# Patient Record
Sex: Female | Born: 1935 | Race: White | Hispanic: No | Smoking: Never smoker
Health system: Southern US, Community
[De-identification: ages and names within clinical notes are randomized; demographics above are authoritative.]

## PROBLEM LIST (undated history)

## (undated) DIAGNOSIS — I4891 Unspecified atrial fibrillation: Secondary | ICD-10-CM

## (undated) DIAGNOSIS — I10 Essential (primary) hypertension: Secondary | ICD-10-CM

## (undated) DIAGNOSIS — K219 Gastro-esophageal reflux disease without esophagitis: Secondary | ICD-10-CM

## (undated) DIAGNOSIS — I499 Cardiac arrhythmia, unspecified: Secondary | ICD-10-CM

## (undated) DIAGNOSIS — J189 Pneumonia, unspecified organism: Secondary | ICD-10-CM

## (undated) DIAGNOSIS — M1990 Unspecified osteoarthritis, unspecified site: Secondary | ICD-10-CM

## (undated) DIAGNOSIS — E119 Type 2 diabetes mellitus without complications: Secondary | ICD-10-CM

## (undated) HISTORY — PX: CHOLECYSTECTOMY: SHX55

## (undated) HISTORY — PX: ABDOMINAL HYSTERECTOMY: SHX81

## (undated) HISTORY — PX: UPPER GI ENDOSCOPY: SHX6162

## (undated) HISTORY — DX: Unspecified atrial fibrillation: I48.91

## (undated) HISTORY — PX: ORIF ANKLE FRACTURE: SUR919

---

## 2004-07-08 ENCOUNTER — Ambulatory Visit: Payer: Self-pay | Admitting: Otolaryngology

## 2004-10-10 ENCOUNTER — Emergency Department: Payer: Self-pay | Admitting: Emergency Medicine

## 2004-10-22 ENCOUNTER — Ambulatory Visit: Payer: Self-pay

## 2004-11-06 ENCOUNTER — Ambulatory Visit: Payer: Self-pay

## 2005-01-09 ENCOUNTER — Inpatient Hospital Stay: Payer: Self-pay | Admitting: Specialist

## 2005-07-12 ENCOUNTER — Ambulatory Visit: Payer: Self-pay | Admitting: Surgery

## 2005-07-14 ENCOUNTER — Ambulatory Visit: Payer: Self-pay | Admitting: Surgery

## 2005-08-10 ENCOUNTER — Ambulatory Visit: Payer: Self-pay

## 2005-08-10 ENCOUNTER — Ambulatory Visit: Payer: Self-pay | Admitting: Unknown Physician Specialty

## 2005-08-11 ENCOUNTER — Ambulatory Visit: Payer: Self-pay | Admitting: Unknown Physician Specialty

## 2005-08-20 ENCOUNTER — Ambulatory Visit: Payer: Self-pay | Admitting: Unknown Physician Specialty

## 2005-09-08 ENCOUNTER — Inpatient Hospital Stay: Payer: Self-pay | Admitting: Obstetrics and Gynecology

## 2005-12-15 ENCOUNTER — Ambulatory Visit: Payer: Self-pay | Admitting: Gynecologic Oncology

## 2005-12-30 ENCOUNTER — Ambulatory Visit: Payer: Self-pay | Admitting: Internal Medicine

## 2006-02-01 ENCOUNTER — Ambulatory Visit: Payer: Self-pay

## 2006-03-24 ENCOUNTER — Ambulatory Visit: Payer: Self-pay | Admitting: Obstetrics and Gynecology

## 2006-09-26 ENCOUNTER — Ambulatory Visit: Payer: Self-pay | Admitting: Obstetrics and Gynecology

## 2006-12-08 ENCOUNTER — Ambulatory Visit: Payer: Self-pay | Admitting: Internal Medicine

## 2007-01-02 ENCOUNTER — Ambulatory Visit: Payer: Self-pay | Admitting: Internal Medicine

## 2007-03-14 ENCOUNTER — Ambulatory Visit: Payer: Self-pay | Admitting: Internal Medicine

## 2007-03-23 IMAGING — CT CT PELVIS W/ CM
1 of 2 series · 14 of 32 positions shown, 19 images · non-contrast
Comparison: none

REASON FOR EXAM: Pelvic mass.  ALLERGIC TO DYE - will pick up pre-med
COMMENTS:

[Series 2: soft tissue pelvis · axial · 0.74mm/px · z∈[-684,-469]mm · 14 of 49 slices shown, 19 images]
[im 3/49  soft-tissue]
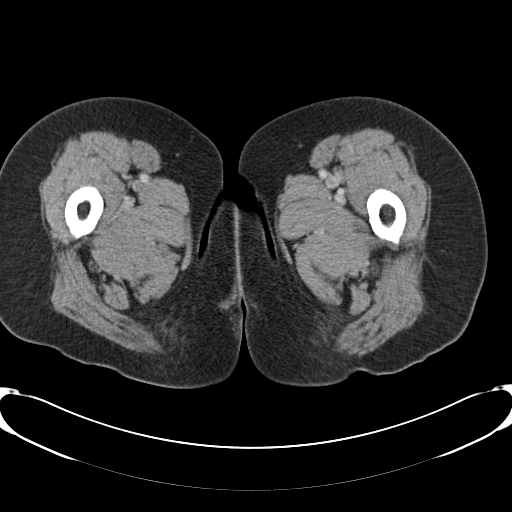
[im 3/49  bone]
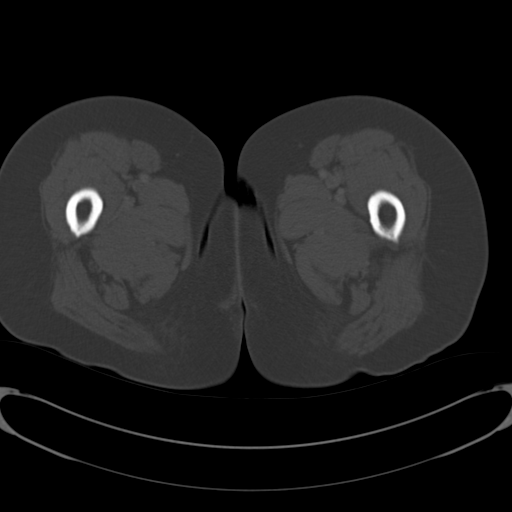
[im 8/49  soft-tissue]
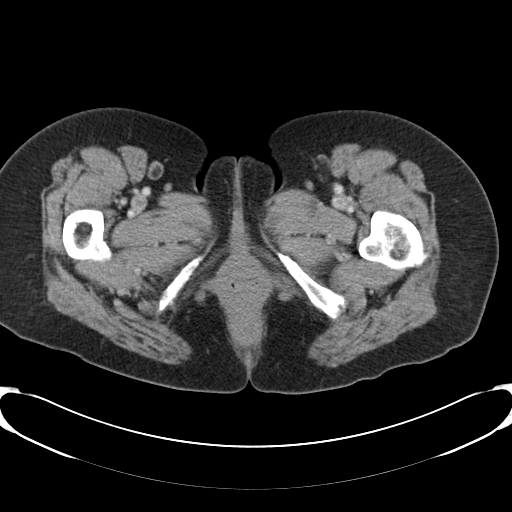
[im 11/49  soft-tissue]
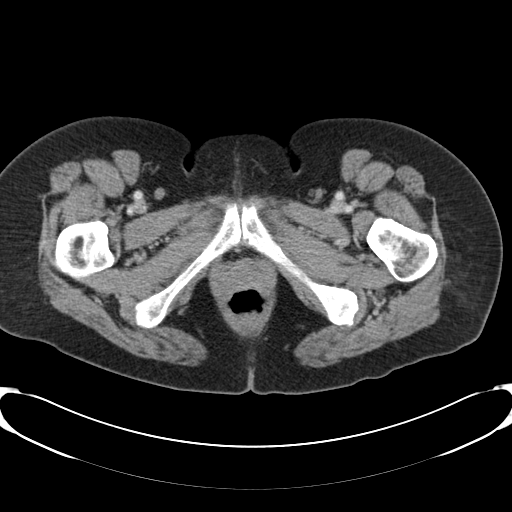
[im 13/49  soft-tissue]
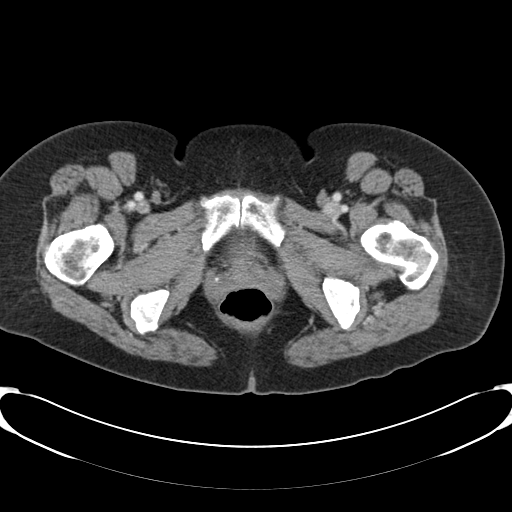
[im 18/49  soft-tissue]
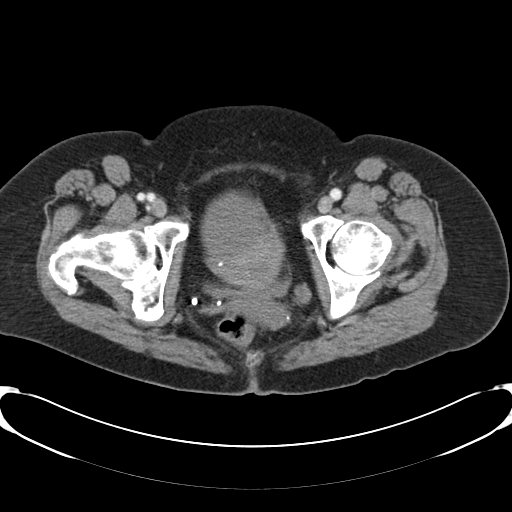
[im 21/49  soft-tissue]
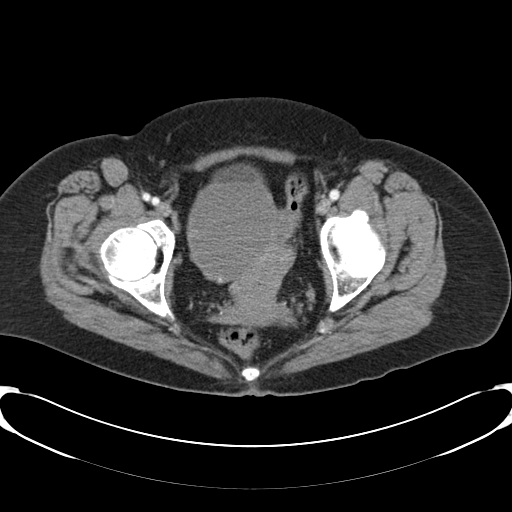
[im 26/49  soft-tissue]
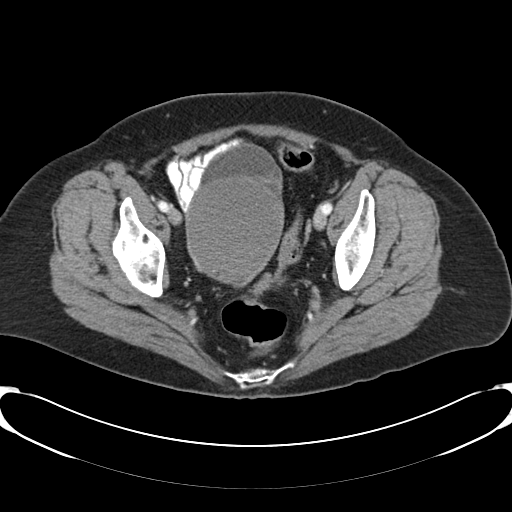
[im 28/49  soft-tissue]
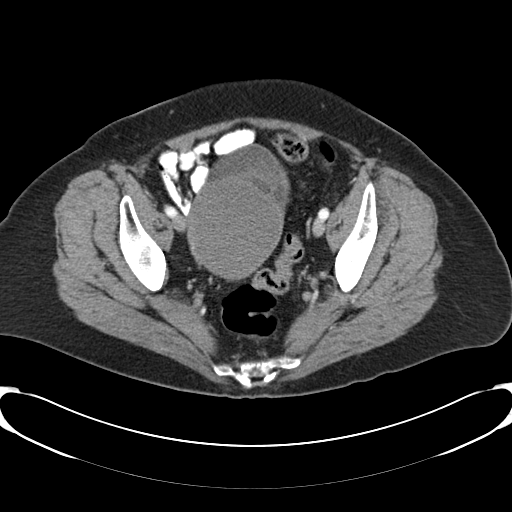
[im 31/49  soft-tissue]
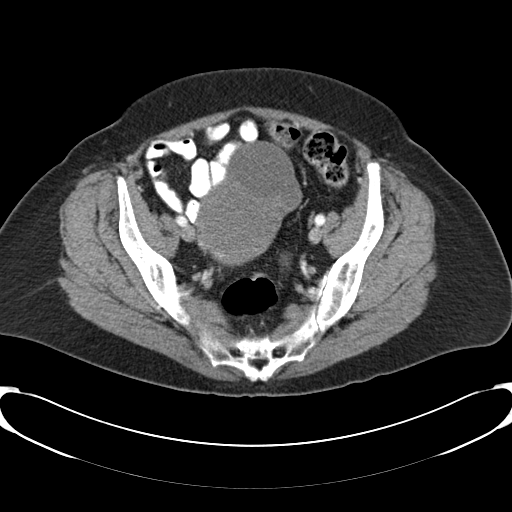
[im 31/49  bone]
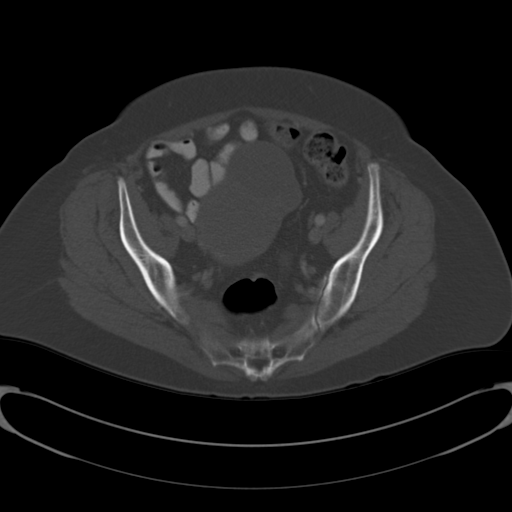
[im 36/49  soft-tissue]
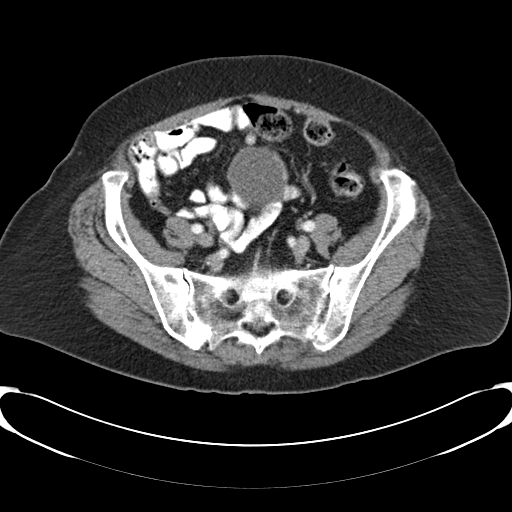
[im 38/49  soft-tissue]
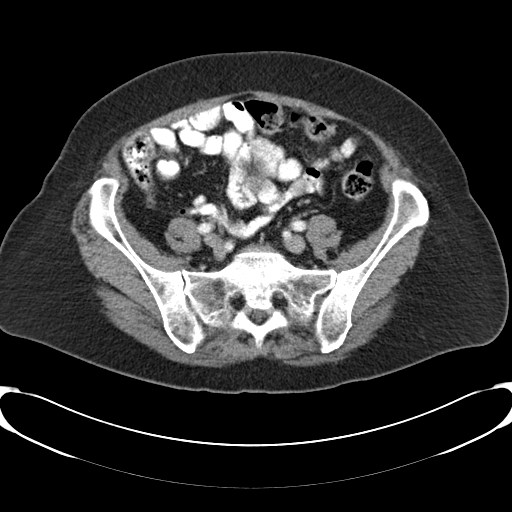
[im 38/49  lung]
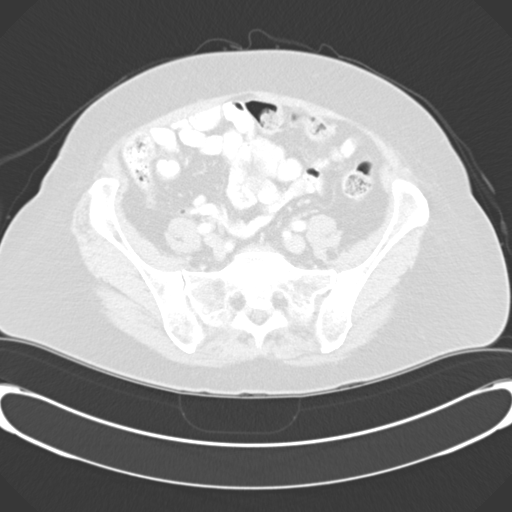
[im 41/49  soft-tissue]
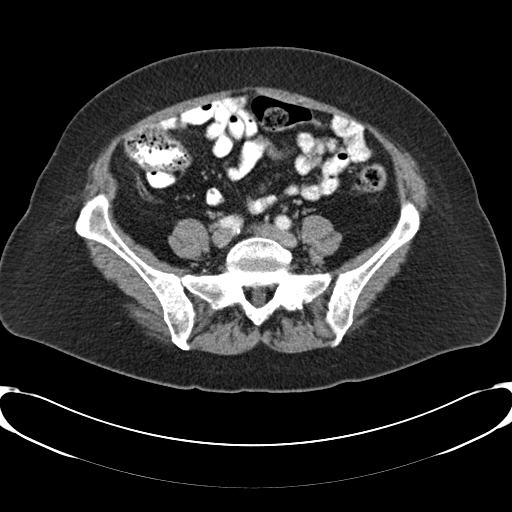
[im 41/49  lung]
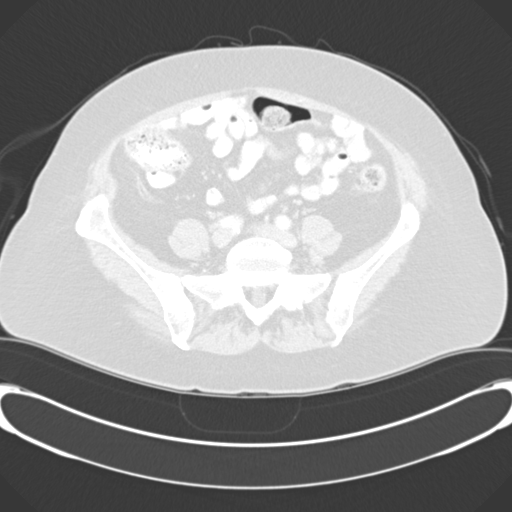
[im 43/49  lung]
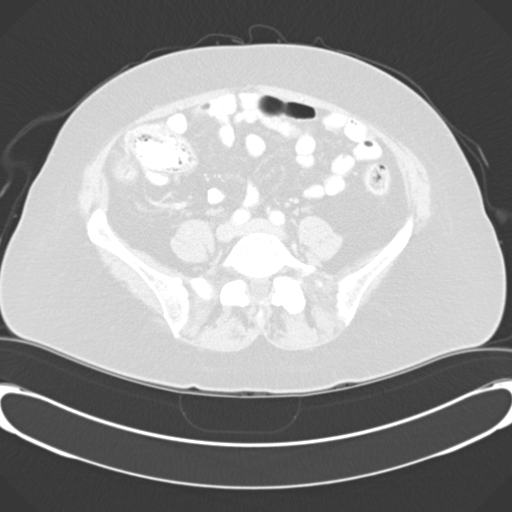
[im 46/49  soft-tissue]
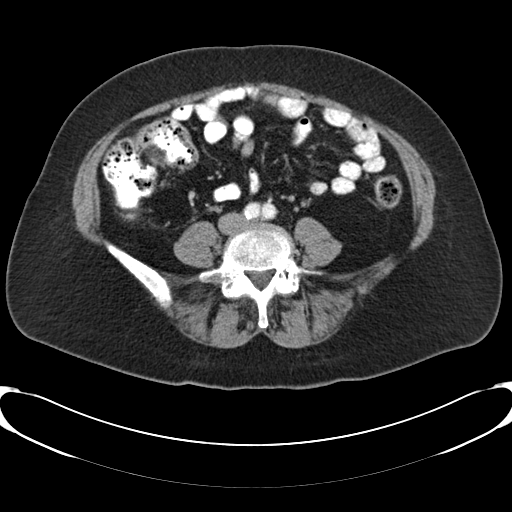
[im 46/49  lung]
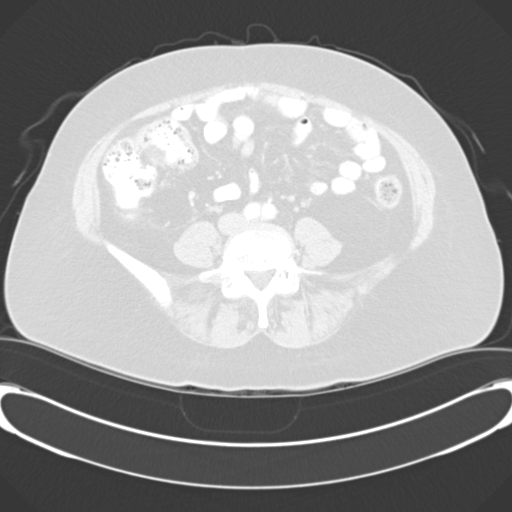

[14 of 32 positions shown; findings below may reference images not displayed]

PROCEDURE:     CT  - CT PELVIS STANDARD W  - August 10, 2005 [DATE]

RESULT:     Axial images were obtained from the iliac crest to the pubic
symphysis post intravenous and oral administration of contrast material.

There is a large mass with variable attenuation in the RIGHT pelvis
measuring 6.8 transverse x 10.24 AP and 10 cm superoinferior dimension.
There is some mild enhancement of a larger posteroinferior component.  The
mass compresses on the uterus deviating the uterus to the LEFT.  This most
likely represents an ovarian tumor, either a mucinous or serous cystadenoma
or carcinoma.  I do not see any definite fluid in the pelvis and no pelvis
adenopathy is identified.
IMPRESSION: Large mass in the RIGHT pelvis which may represent a mucinous or serous
cystadenoma or carcinoma.  No definite adenopathy or fluid is seen within
the pelvis.

## 2007-03-24 IMAGING — US US PELV - US TRANSVAGINAL
1 series · 17 of 25 positions shown · non-contrast
Comparison: none

REASON FOR EXAM: Pelvic mass
COMMENTS:

[Series 1: us pelv - us transvaginal · 17 of 86 slices shown]
[im 1/86]
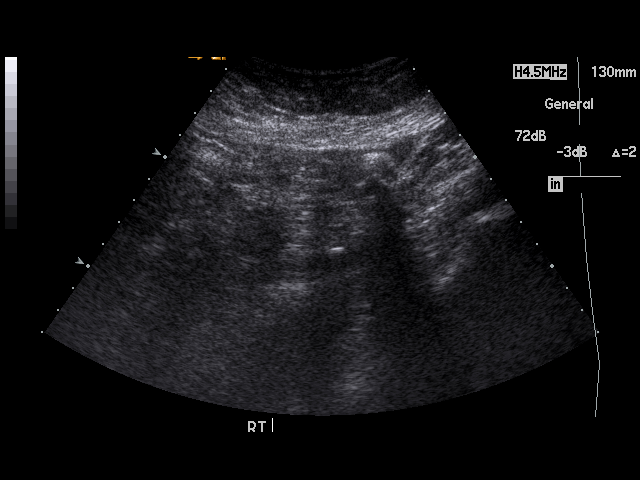
[im 8/86]
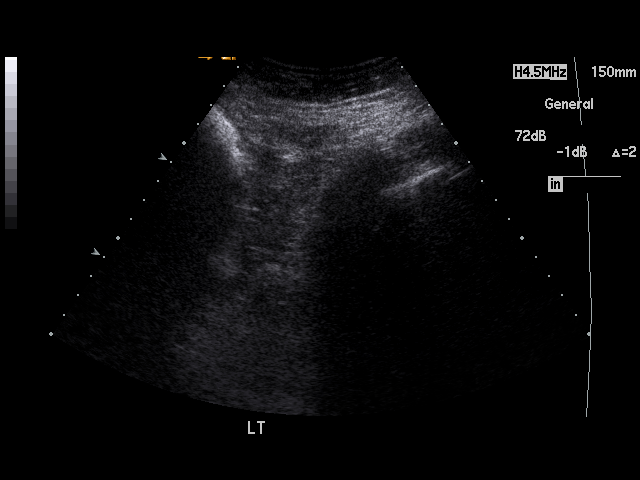
[im 11/86]
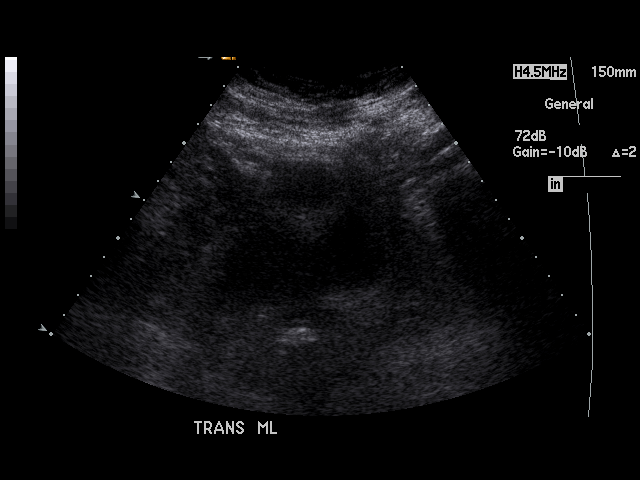
[im 18/86]
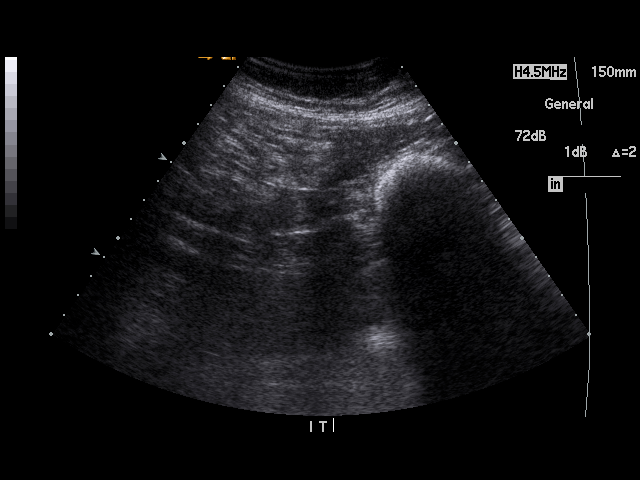
[im 22/86]
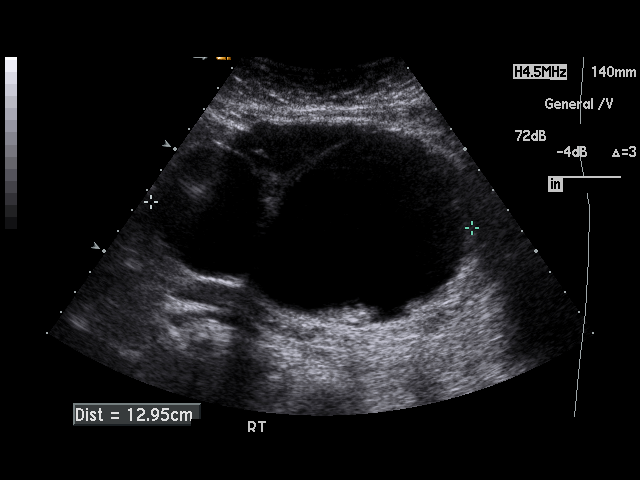
[im 29/86]
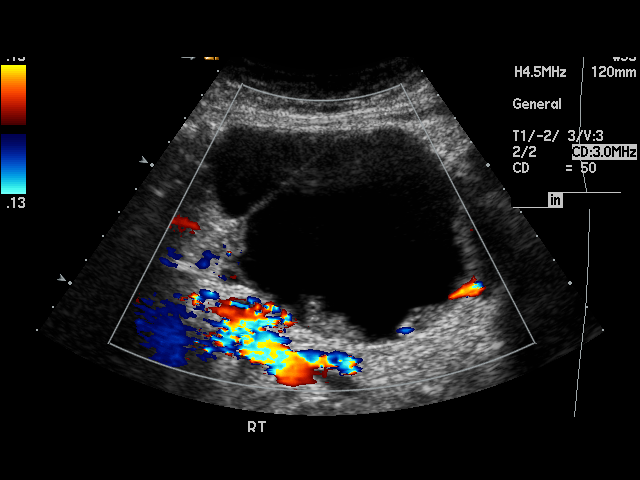
[im 32/86]
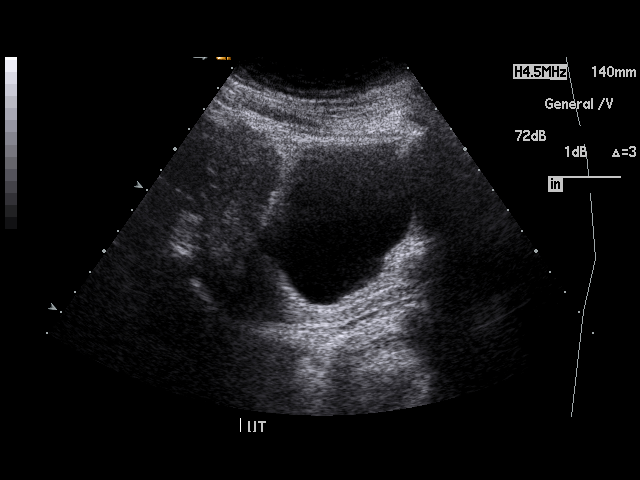
[im 39/86]
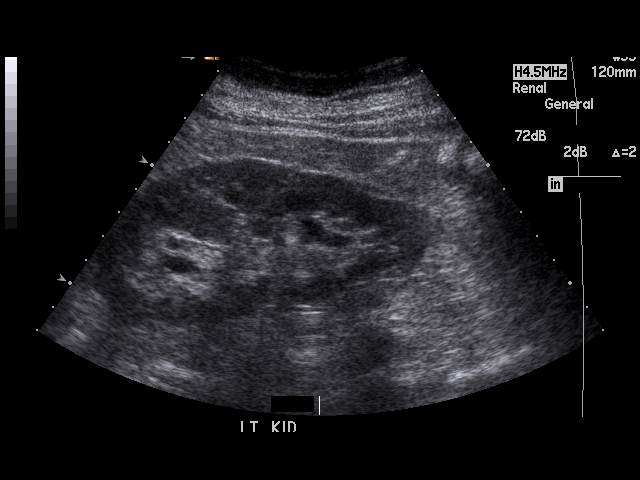
[im 43/86]
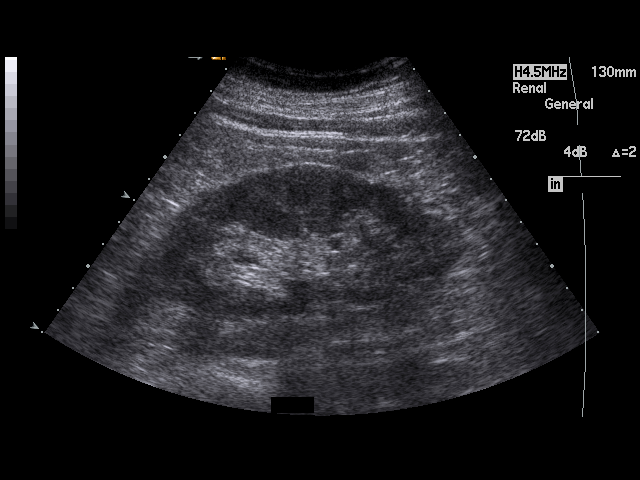
[im 47/86]
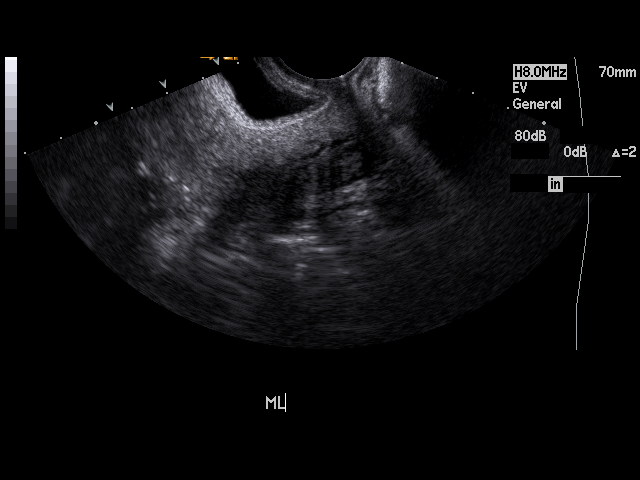
[im 54/86]
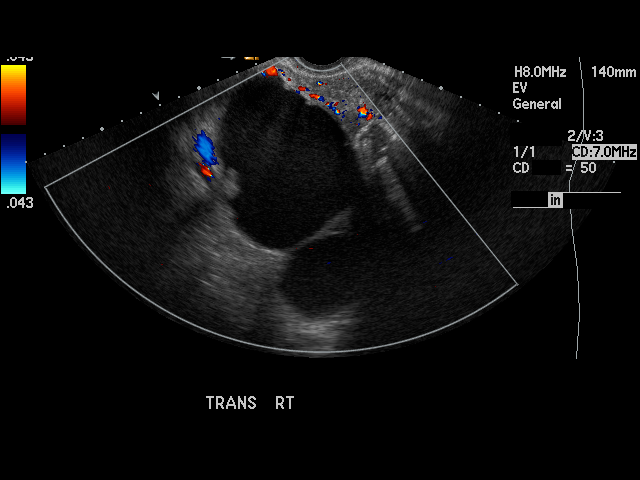
[im 57/86]
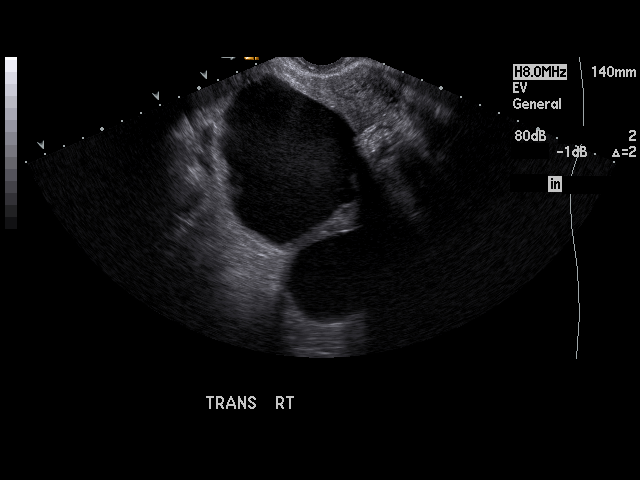
[im 64/86]
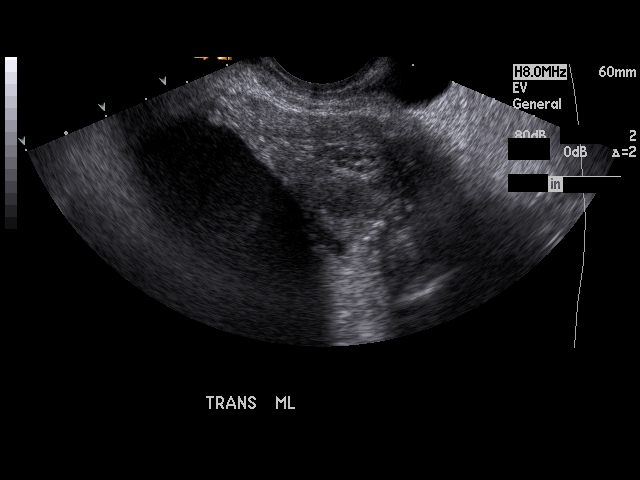
[im 68/86]
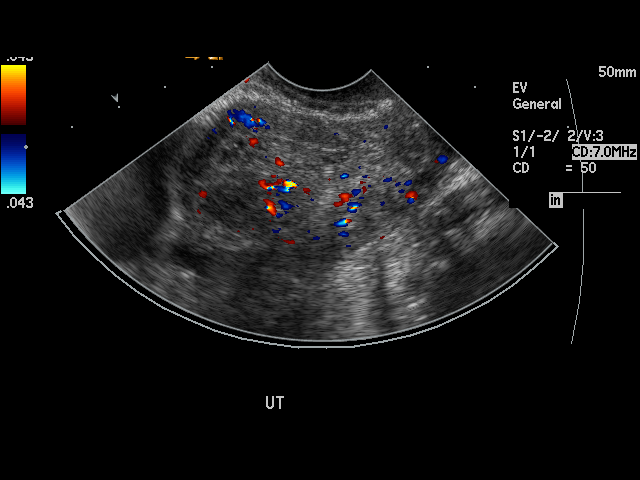
[im 75/86]
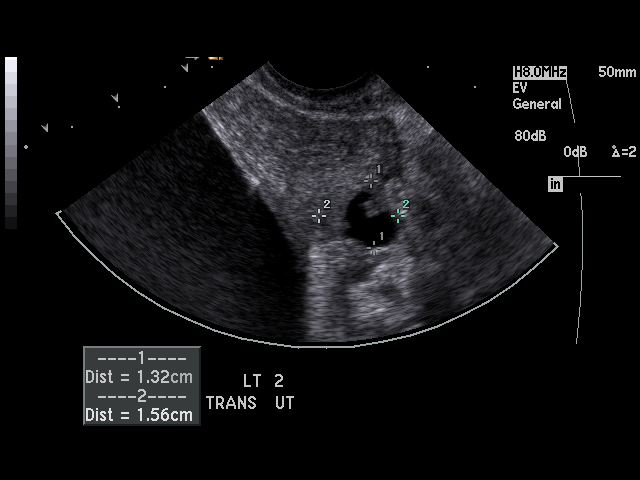
[im 78/86]
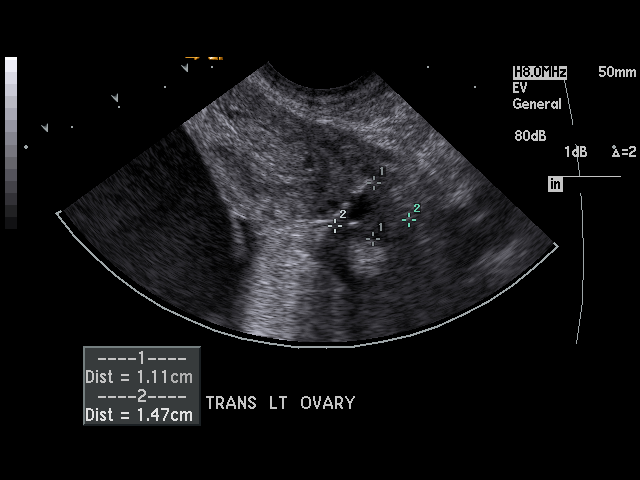
[im 86/86]
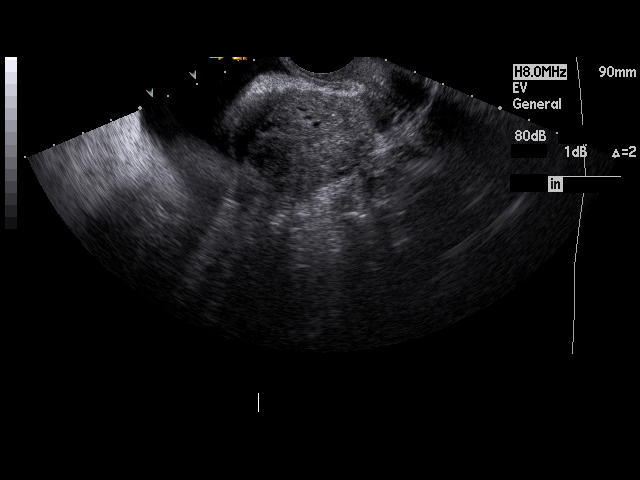

[17 of 25 positions shown; findings below may reference images not displayed]

PROCEDURE:     US  - US PELVIS MASS EXAM  - [DATE]  [DATE] [DATE]  [DATE]

RESULT:     The uterus measures 8.5 cm x 4.1 cm x 5.6 cm. The endometrium
measures 1.1 cm in thickness. There is a hypoechoic mass of the uterus
posteriorly on the RIGHT measuring 1.59 cm at maximum diameter compatible
with a small, uterine fibroid. On the LEFT there is an apparent 1.6 cm
cystic or complex mass associated with the posterior uterine wall.

In the RIGHT adnexal area there is a septated cystic mass measuring 12.95 cm
x 8.47 cm x 7.04 cm. A septated ovarian mass would be the primary
consideration as to etiology. The LEFT ovary is normal in appearance and
measures 2.06 cm at maximum diameter. No free fluid is seen in the pelvis.
The kidneys show no hydronephrosis.
IMPRESSION: 1.     The uterine endometrium measures 1.1 cm which is thickened for the
patient's age.
2.     There is a hypoechoic mass in the uterus compatible with a uterine
fibroid.
3.     There is a cystic or complex 1.6 cm mass associated with the
posterior margin of the uterus.
4.     There is a septated, 12.95 cm, complex or septated cystic mass in the
RIGHT adnexal area compatible with a RIGHT ovarian mass.
5.     The LEFT ovary is normal in appearance.
6.     No ascites is seen.

## 2007-05-09 ENCOUNTER — Other Ambulatory Visit: Payer: Self-pay

## 2007-05-09 ENCOUNTER — Ambulatory Visit: Payer: Self-pay | Admitting: Gastroenterology

## 2007-10-05 ENCOUNTER — Ambulatory Visit: Payer: Self-pay | Admitting: Obstetrics and Gynecology

## 2007-10-30 ENCOUNTER — Ambulatory Visit: Payer: Self-pay | Admitting: Family Medicine

## 2007-11-06 ENCOUNTER — Emergency Department: Payer: Self-pay | Admitting: Emergency Medicine

## 2007-11-06 ENCOUNTER — Other Ambulatory Visit: Payer: Self-pay

## 2008-06-13 ENCOUNTER — Ambulatory Visit: Payer: Self-pay | Admitting: Gastroenterology

## 2008-06-19 ENCOUNTER — Ambulatory Visit: Payer: Self-pay | Admitting: Internal Medicine

## 2008-07-24 ENCOUNTER — Ambulatory Visit: Payer: Self-pay | Admitting: Internal Medicine

## 2009-04-22 ENCOUNTER — Ambulatory Visit: Payer: Self-pay | Admitting: Internal Medicine

## 2009-04-25 ENCOUNTER — Inpatient Hospital Stay: Payer: Self-pay | Admitting: Specialist

## 2009-06-02 ENCOUNTER — Ambulatory Visit: Payer: Self-pay | Admitting: Internal Medicine

## 2009-06-24 ENCOUNTER — Ambulatory Visit: Payer: Self-pay | Admitting: Internal Medicine

## 2009-09-06 HISTORY — PX: BREAST BIOPSY: SHX20

## 2010-06-30 ENCOUNTER — Ambulatory Visit: Payer: Self-pay

## 2010-07-14 ENCOUNTER — Ambulatory Visit: Payer: Self-pay

## 2010-08-11 ENCOUNTER — Ambulatory Visit: Payer: Self-pay | Admitting: General Surgery

## 2011-05-05 ENCOUNTER — Ambulatory Visit: Payer: Self-pay

## 2012-09-07 ENCOUNTER — Ambulatory Visit: Payer: Self-pay

## 2013-12-07 ENCOUNTER — Ambulatory Visit: Payer: Self-pay

## 2015-03-04 ENCOUNTER — Inpatient Hospital Stay: Admission: RE | Admit: 2015-03-04 | Payer: Self-pay | Source: Ambulatory Visit

## 2015-03-12 NOTE — OR Nursing (Signed)
This RN spoke with Vanessa Dalton NP at [facility]; Dr Stephanie Coup was not at the office today.  Vanessa Gentry states patient is ok for surgery to proceed.  Normal stress test was performed on 01/15/15; Echocardiogram was performed on 01/23/15 with EF of 47.  ECG was perfomed with HR of 155.  Vanessa Gentry states heart rate has been 70-85 at the office visits for last 6 months and the patient was not in atrial fibrillation as per last visit,

## 2015-03-13 ENCOUNTER — Encounter: Payer: Self-pay | Admitting: *Deleted

## 2015-03-13 ENCOUNTER — Ambulatory Visit
Admission: RE | Admit: 2015-03-13 | Discharge: 2015-03-13 | Disposition: A | Discharge status: Home / Self Care | Payer: Commercial Managed Care - HMO | Source: Ambulatory Visit | Attending: Ophthalmology | Admitting: Ophthalmology

## 2015-03-13 ENCOUNTER — Ambulatory Visit: Payer: Commercial Managed Care - HMO | Admitting: Anesthesiology

## 2015-03-13 ENCOUNTER — Encounter
Admission: RE | Disposition: A | Discharge status: Home / Self Care | Payer: Self-pay | Source: Ambulatory Visit | Attending: Ophthalmology

## 2015-03-13 DIAGNOSIS — Z79899 Other long term (current) drug therapy: Secondary | ICD-10-CM | POA: Diagnosis not present

## 2015-03-13 DIAGNOSIS — Z91041 Radiographic dye allergy status: Secondary | ICD-10-CM | POA: Insufficient documentation

## 2015-03-13 DIAGNOSIS — E119 Type 2 diabetes mellitus without complications: Secondary | ICD-10-CM | POA: Insufficient documentation

## 2015-03-13 DIAGNOSIS — H2511 Age-related nuclear cataract, right eye: Secondary | ICD-10-CM | POA: Diagnosis not present

## 2015-03-13 DIAGNOSIS — R002 Palpitations: Secondary | ICD-10-CM | POA: Diagnosis not present

## 2015-03-13 DIAGNOSIS — I499 Cardiac arrhythmia, unspecified: Secondary | ICD-10-CM | POA: Insufficient documentation

## 2015-03-13 DIAGNOSIS — Z90710 Acquired absence of both cervix and uterus: Secondary | ICD-10-CM | POA: Insufficient documentation

## 2015-03-13 DIAGNOSIS — Z882 Allergy status to sulfonamides status: Secondary | ICD-10-CM | POA: Insufficient documentation

## 2015-03-13 DIAGNOSIS — M1990 Unspecified osteoarthritis, unspecified site: Secondary | ICD-10-CM | POA: Diagnosis not present

## 2015-03-13 DIAGNOSIS — K219 Gastro-esophageal reflux disease without esophagitis: Secondary | ICD-10-CM | POA: Insufficient documentation

## 2015-03-13 DIAGNOSIS — Z9049 Acquired absence of other specified parts of digestive tract: Secondary | ICD-10-CM | POA: Diagnosis not present

## 2015-03-13 DIAGNOSIS — I10 Essential (primary) hypertension: Secondary | ICD-10-CM | POA: Diagnosis not present

## 2015-03-13 HISTORY — DX: Type 2 diabetes mellitus without complications: E11.9

## 2015-03-13 HISTORY — DX: Unspecified osteoarthritis, unspecified site: M19.90

## 2015-03-13 HISTORY — DX: Essential (primary) hypertension: I10

## 2015-03-13 HISTORY — PX: CATARACT EXTRACTION W/PHACO: SHX586

## 2015-03-13 HISTORY — DX: Gastro-esophageal reflux disease without esophagitis: K21.9

## 2015-03-13 HISTORY — DX: Pneumonia, unspecified organism: J18.9

## 2015-03-13 HISTORY — DX: Cardiac arrhythmia, unspecified: I49.9

## 2015-03-13 LAB — GLUCOSE, CAPILLARY: Glucose-Capillary: 126 mg/dL — ABNORMAL HIGH (ref 65–99)

## 2015-03-13 SURGERY — PHACOEMULSIFICATION, CATARACT, WITH IOL INSERTION
Anesthesia: Monitor Anesthesia Care | Laterality: Right | Wound class: Clean

## 2015-03-13 MED ORDER — MOXIFLOXACIN HCL 0.5 % OP SOLN
OPHTHALMIC | Status: AC
  Filled 2015-03-13: qty 3

## 2015-03-13 MED ORDER — SODIUM CHLORIDE 0.9 % IV SOLN
INTRAVENOUS | Status: DC
  Administered 2015-03-13: 08:00:00 via INTRAVENOUS

## 2015-03-13 MED ORDER — MIDAZOLAM HCL 2 MG/2ML IJ SOLN
INTRAMUSCULAR | Status: DC | PRN
  Administered 2015-03-13: 1 mg via INTRAVENOUS

## 2015-03-13 MED ORDER — FENTANYL CITRATE (PF) 100 MCG/2ML IJ SOLN
INTRAMUSCULAR | Status: DC | PRN
  Administered 2015-03-13: 50 ug via INTRAVENOUS

## 2015-03-13 MED ORDER — LIDOCAINE HCL (PF) 4 % IJ SOLN
INTRAMUSCULAR | Status: AC
  Filled 2015-03-13: qty 5

## 2015-03-13 MED ORDER — POVIDONE-IODINE 5 % OP SOLN
1.0000 "application " | Freq: Once | OPHTHALMIC | Status: AC
  Administered 2015-03-13: 1 via OPHTHALMIC

## 2015-03-13 MED ORDER — EPINEPHRINE HCL 1 MG/ML IJ SOLN
INTRAMUSCULAR | Status: AC
  Filled 2015-03-13: qty 1

## 2015-03-13 MED ORDER — ARMC OPHTHALMIC DILATING GEL
OPHTHALMIC | Status: AC
  Administered 2015-03-13: 1 via OPHTHALMIC
  Filled 2015-03-13: qty 0.25

## 2015-03-13 MED ORDER — POVIDONE-IODINE 5 % OP SOLN
OPHTHALMIC | Status: AC
  Administered 2015-03-13: 1 via OPHTHALMIC
  Filled 2015-03-13: qty 30

## 2015-03-13 MED ORDER — NA HYALUR & NA CHOND-NA HYALUR 0.55-0.5 ML IO KIT
PACK | INTRAOCULAR | Status: AC
  Filled 2015-03-13: qty 1.05

## 2015-03-13 MED ORDER — TETRACAINE HCL 0.5 % OP SOLN
OPHTHALMIC | Status: AC
  Administered 2015-03-13: 1 [drp] via OPHTHALMIC
  Filled 2015-03-13: qty 2

## 2015-03-13 MED ORDER — CEFUROXIME OPHTHALMIC INJECTION 1 MG/0.1 ML
INJECTION | OPHTHALMIC | Status: AC
  Filled 2015-03-13: qty 0.1

## 2015-03-13 MED ORDER — EPINEPHRINE HCL 1 MG/ML IJ SOLN
INTRAOCULAR | Status: DC | PRN
  Administered 2015-03-13: 150 mL via OPHTHALMIC

## 2015-03-13 MED ORDER — CEFUROXIME OPHTHALMIC INJECTION 1 MG/0.1 ML
INJECTION | OPHTHALMIC | Status: DC | PRN
  Administered 2015-03-13: 0.1 mL via INTRACAMERAL

## 2015-03-13 MED ORDER — MOXIFLOXACIN HCL 0.5 % OP SOLN
OPHTHALMIC | Status: DC | PRN
  Administered 2015-03-13: 2 [drp]

## 2015-03-13 MED ORDER — MOXIFLOXACIN HCL 0.5 % OP SOLN: 1.0000 [drp] | Freq: Once | OPHTHALMIC | Status: DC

## 2015-03-13 MED ORDER — TETRACAINE HCL 0.5 % OP SOLN
1.0000 [drp] | Freq: Once | OPHTHALMIC | Status: AC
  Administered 2015-03-13: 1 [drp] via OPHTHALMIC

## 2015-03-13 MED ORDER — ARMC OPHTHALMIC DILATING GEL
1.0000 "application " | OPHTHALMIC | Status: DC | PRN
  Administered 2015-03-13 (×2): 1 via OPHTHALMIC

## 2015-03-13 MED ORDER — NEOMYCIN-POLYMYXIN-DEXAMETH 3.5-10000-0.1 OP OINT
TOPICAL_OINTMENT | OPHTHALMIC | Status: DC | PRN
  Administered 2015-03-13: 1 via OPHTHALMIC

## 2015-03-13 SURGICAL SUPPLY — 19 items
CANNULA ANT/CHMB 27GA (MISCELLANEOUS) ×2 IMPLANT
GLOVE BIO SURGEON STRL SZ8 (GLOVE) ×2 IMPLANT
GLOVE BIOGEL M 6.5 STRL (GLOVE) ×2 IMPLANT
GLOVE SURG LX 7.5 STRW (GLOVE) ×1
GLOVE SURG LX STRL 7.5 STRW (GLOVE) ×1 IMPLANT
GOWN STRL REUS W/ TWL LRG LVL3 (GOWN DISPOSABLE) ×2 IMPLANT
GOWN STRL REUS W/TWL LRG LVL3 (GOWN DISPOSABLE) ×2
LENS IOL TECNIS 16.5 (Intraocular Lens) ×2 IMPLANT
PACK CATARACT (MISCELLANEOUS) ×2 IMPLANT
PACK CATARACT BRASINGTON LX (MISCELLANEOUS) ×2 IMPLANT
PACK EYE AFTER SURG (MISCELLANEOUS) ×2 IMPLANT
SOL BSS BAG (MISCELLANEOUS) ×2
SOL PREP PVP 2OZ (MISCELLANEOUS) ×2
SOLUTION BSS BAG (MISCELLANEOUS) ×1 IMPLANT
SOLUTION PREP PVP 2OZ (MISCELLANEOUS) ×1 IMPLANT
SYR 5ML LL (SYRINGE) ×2 IMPLANT
SYR TB 1ML 27GX1/2 LL (SYRINGE) ×2 IMPLANT
WATER STERILE IRR 1000ML POUR (IV SOLUTION) ×2 IMPLANT
WIPE NON LINTING 3.25X3.25 (MISCELLANEOUS) ×2 IMPLANT

## 2015-03-13 NOTE — Anesthesia Postprocedure Evaluation (Signed)
  Anesthesia Post-op Note  Patient: Vanessa Gentry  Procedure(s) Performed: Procedure(s) with comments: CATARACT EXTRACTION PHACO AND INTRAOCULAR LENS PLACEMENT (IOC) (Right) - [place]  2:40  AP 20.3   CDE 32.41 cassette lot #   [id number]  Anesthesia type:MAC  Patient location: short stay  Post pain: Pain level controlled  Post assessment: Post-op Vital signs reviewed, Patient's Cardiovascular Status Stable, Respiratory Function Stable, Patent Airway and No signs of Nausea or vomiting  Post vital signs: Reviewed and stable  Last Vitals:  Filed Vitals:   03/13/15 0654  BP: 138/62  Pulse: 64  Temp: 36.4 C  Resp: 16    Level of consciousness: awake, alert  and patient cooperative  Complications: No apparent anesthesia complications

## 2015-03-13 NOTE — Anesthesia Preprocedure Evaluation (Signed)
Anesthesia Evaluation  Patient identified by MRN, date of birth, ID band Patient awake    Reviewed: Allergy & Precautions, NPO status , Patient's Chart, lab work & pertinent test results, reviewed documented beta blocker date and time   Airway Mallampati: II  TM Distance: >3 FB     Dental  (+) Chipped   Pulmonary pneumonia -,          Cardiovascular hypertension, + dysrhythmias     Neuro/Psych    GI/Hepatic GERD-  ,  Endo/Other  diabetes, Well Controlled, Type 2  Renal/GU      Musculoskeletal  (+) Arthritis -,   Abdominal   Peds  Hematology   Anesthesia Other Findings   Reproductive/Obstetrics                             Anesthesia Physical Anesthesia Plan  ASA: III  Anesthesia Plan: MAC   Post-op Pain Management:    Induction:   Airway Management Planned: Nasal Cannula  Additional Equipment:   Intra-op Plan:   Post-operative Plan:   Informed Consent: I have reviewed the patients History and Physical, chart, labs and discussed the procedure including the risks, benefits and alternatives for the proposed anesthesia with the patient or authorized representative who has indicated his/her understanding and acceptance.     Plan Discussed with: CRNA  Anesthesia Plan Comments:         Anesthesia Quick Evaluation

## 2015-03-13 NOTE — Discharge Instructions (Signed)
AMBULATORY SURGERY  DISCHARGE INSTRUCTIONS   1) The drugs that you were given will stay in your system until tomorrow so for the next 24 hours you should not:  A) Drive an automobile B) Make any legal decisions C) Drink any alcoholic beverage   2) You may resume regular meals tomorrow.  Today it is better to start with liquids and gradually work up to solid foods.  You may eat anything you prefer, but it is better to start with liquids, then soup and crackers, and gradually work up to solid foods.   3) Please notify your doctor immediately if you have any unusual bleeding, trouble breathing, redness and pain at the surgery site, drainage, fever, or pain not relieved by medication.    Additional Instructions:Cataract Surgery Care After Refer to this sheet in the next few weeks. These instructions provide you with information on caring for yourself after your procedure. Your caregiver may also give you more specific instructions. Your treatment has been planned according to current medical practices, but problems sometimes occur. Call your caregiver if you have any problems or questions after your procedure.  HOME CARE INSTRUCTIONS  Avoid strenuous activities as directed by your caregiver. Ask your caregiver when you can resume driving. Use eyedrops or other medicines to help healing and control pressure inside your eye as directed by your caregiver. Only take over-the-counter or prescription medicines for pain, discomfort, or fever as directed by your caregiver. Do not to touch or rub your eyes. You may be instructed to use a protective shield during the first few days and nights after surgery. If not, wear sunglasses to protect your eyes. This is to protect the eye from pressure or from being accidentally bumped. Keep the area around your eye clean and dry. Avoid swimming or allowing water to hit you directly in the face while showering. Keep soap and shampoo out of your eyes. Do not  bend or lift heavy objects. Bending increases pressure in the eye. You can walk, climb stairs, and do light household chores. Do not put a contact lens into the eye that had surgery until your caregiver says it is okay to do so. Ask your doctor when you can return to work. This will depend on the kind of work that you do. If you work in a dusty environment, you may be advised to wear protective eyewear for a period of time. Ask your caregiver when it will be safe to engage in sexual activity. Continue with your regular eye exams as directed by your caregiver. What to expect: It is normal to feel itching and mild discomfort for a few days after cataract surgery. Some fluid discharge is also common, and your eye may be sensitive to light and touch. After 1 to 2 days, even moderate discomfort should disappear. In most cases, healing will take about 6 weeks. If you received an intraocular lens (IOL), you may notice that colors are very bright or have a blue tinge. Also, if you have been in bright sunlight, everything may appear reddish for a few hours. If you see these color tinges, it is because your lens is clear and no longer cloudy. Within a few months after receiving an IOL, these extra colors should go away. When you have healed, you will probably need new glasses. SEEK MEDICAL CARE IF:  You have increased bruising around your eye. You have discomfort not helped by medicine. SEEK IMMEDIATE MEDICAL CARE IF:  You have a fever. You have a  worsening or sudden vision loss. You have redness, swelling, or increasing pain in the eye. You have a thick discharge from the eye that had surgery. MAKE SURE YOU: Understand these instructions. Will watch your condition. Will get help right away if you are not doing well or get worse. Document Released: 03/12/2005 Document Revised: 11/15/2011 Document Reviewed: 04/16/2011 [facility] Patient Information 2015 [place], [place]. This information is not intended to  replace advice given to you by your health care provider. Make sure you discuss any questions you have with your health care provider. 4)         Please contact your physician with any problems or Same Day Surgery at [phone], Monday through Friday 6 am to 4 pm, or [place] at [facility] number at [phone].

## 2015-03-13 NOTE — Op Note (Signed)
OPERATIVE NOTE  Vanessa Gentry [id number] 03/13/2015   PREOPERATIVE DIAGNOSIS:  Nuclear Sclerotic Cataract Right Eye H25.11   POSTOPERATIVE DIAGNOSIS: Nuclear Sclerotic Cataract Right Eye H25.11          PROCEDURE:  Phacoemusification with posterior chamber intraocular lens placement of the right eye   LENS:   Implant Name Type Inv. Item Serial No. Manufacturer Lot No. LRB No. Used  IOL   [id number]     [id number]     Right 1    16.5 D PCIOL   ULTRASOUND TIME: 20 of 2 minutes 40 seconds, CDE 32.4  SURGEON:  Wyonia Hough, MD   ANESTHESIA:  Topical with tetracaine drops and 2% Xylocaine jelly.   COMPLICATIONS:  None.   DESCRIPTION OF PROCEDURE:  The patient was identified in the holding room and transported to the operating room and placed in the supine position under the operating microscope. Theright eye was identified as the operative eye and it was prepped and draped in the usual sterile ophthalmic fashion.   A 1 millimeter clear-corneal paracentesis was made at the 12:00 position.  The anterior chamber was filled with Viscoat viscoelastic.  A 2.4 millimeter keratome was used to make a near-clear corneal incision at the 9:00 position. A curvilinear capsulorrhexis was made with a cystotome and capsulorrhexis forceps.  Balanced salt solution was used to hydrodissect and hydrodelineate the nucleus.   Phacoemulsification was then used in stop and chop fashion to remove the lens nucleus and epinucleus.  The remaining cortex was then removed using the irrigation and aspiration handpiece. Provisc was then placed into the capsular bag to distend it for lens placement.  A lens was then injected into the capsular bag.  The remaining viscoelastic was aspirated.  Wounds were hydrated with balanced salt solution.  The anterior chamber was inflated to a physiologic pressure with balanced salt solution. Cefuroxime 0.1 ml of a 10mg /ml solution was injected into the anterior chamber for a  dose of 1 mg of intracameral antibiotic at the completion of the case. Miostat was placed into the anterior chamber to constrict the pupil.  No wound leaks were noted.  Topical Vigamox drops and Maxitrol ointment were applied to the eye.  The patient was taken to the recovery room in stable condition without complications of anesthesia or surgery.  Vanessa Gentry 03/13/2015, 8:55 AM

## 2015-03-13 NOTE — Transfer of Care (Signed)
Immediate Anesthesia Transfer of Care Note  Patient: Vanessa Gentry  Procedure(s) Performed: Procedure(s) with comments: CATARACT EXTRACTION PHACO AND INTRAOCULAR LENS PLACEMENT (IOC) (Right) - [place]  2:40  AP 20.3   CDE 32.41 cassette lot #   [id number]  Patient Location: Short Stay  Anesthesia Type:MAC  Level of Consciousness: awake, alert  and oriented  Airway & Oxygen Therapy: Patient Spontanous Breathing and Patient connected to nasal cannula oxygen  Post-op Assessment: Report given to RN and Post -op Vital signs reviewed and stable  Post vital signs: Reviewed and stable  Last Vitals: 99% 64 hr 96.9 temp 146/63 Filed Vitals:   03/13/15 0654  BP: 138/62  Pulse: 64  Temp: 36.4 C  Resp: 16    Complications: No apparent anesthesia complications

## 2015-03-13 NOTE — H&P (Signed)
  The History and Physical notes were scanned in.  The patient remains stable and unchanged from the H&P.   Previous H&P reviewed, patient examined, and there are no changes.  Vanessa Gentry 03/13/2015 8:23 AM

## 2015-10-29 ENCOUNTER — Encounter: Payer: Self-pay | Admitting: Ophthalmology

## 2015-12-29 DIAGNOSIS — E139 Other specified diabetes mellitus without complications: Secondary | ICD-10-CM | POA: Diagnosis not present

## 2015-12-29 DIAGNOSIS — I10 Essential (primary) hypertension: Secondary | ICD-10-CM | POA: Diagnosis not present

## 2015-12-29 DIAGNOSIS — D485 Neoplasm of uncertain behavior of skin: Secondary | ICD-10-CM | POA: Diagnosis not present

## 2016-02-12 DIAGNOSIS — L820 Inflamed seborrheic keratosis: Secondary | ICD-10-CM | POA: Diagnosis not present

## 2016-02-12 DIAGNOSIS — L538 Other specified erythematous conditions: Secondary | ICD-10-CM | POA: Diagnosis not present

## 2016-04-27 ENCOUNTER — Other Ambulatory Visit: Payer: Self-pay | Admitting: Nurse Practitioner

## 2016-04-27 DIAGNOSIS — Z1231 Encounter for screening mammogram for malignant neoplasm of breast: Secondary | ICD-10-CM

## 2016-04-27 DIAGNOSIS — Z0001 Encounter for general adult medical examination with abnormal findings: Secondary | ICD-10-CM | POA: Diagnosis not present

## 2016-04-27 DIAGNOSIS — E119 Type 2 diabetes mellitus without complications: Secondary | ICD-10-CM | POA: Diagnosis not present

## 2016-04-27 DIAGNOSIS — E1165 Type 2 diabetes mellitus with hyperglycemia: Secondary | ICD-10-CM | POA: Diagnosis not present

## 2016-04-27 DIAGNOSIS — E559 Vitamin D deficiency, unspecified: Secondary | ICD-10-CM | POA: Diagnosis not present

## 2016-04-27 DIAGNOSIS — I10 Essential (primary) hypertension: Secondary | ICD-10-CM | POA: Diagnosis not present

## 2016-04-27 DIAGNOSIS — E782 Mixed hyperlipidemia: Secondary | ICD-10-CM | POA: Diagnosis not present

## 2016-05-20 ENCOUNTER — Other Ambulatory Visit: Payer: Self-pay | Admitting: Nurse Practitioner

## 2016-05-20 ENCOUNTER — Ambulatory Visit
Admission: RE | Admit: 2016-05-20 | Discharge: 2016-05-20 | Disposition: A | Discharge status: Home / Self Care | Payer: PPO | Source: Ambulatory Visit | Attending: Nurse Practitioner | Admitting: Nurse Practitioner

## 2016-05-20 DIAGNOSIS — Z1231 Encounter for screening mammogram for malignant neoplasm of breast: Secondary | ICD-10-CM

## 2016-10-14 DIAGNOSIS — Z0001 Encounter for general adult medical examination with abnormal findings: Secondary | ICD-10-CM | POA: Diagnosis not present

## 2016-10-14 DIAGNOSIS — I10 Essential (primary) hypertension: Secondary | ICD-10-CM | POA: Diagnosis not present

## 2016-10-14 DIAGNOSIS — E119 Type 2 diabetes mellitus without complications: Secondary | ICD-10-CM | POA: Diagnosis not present

## 2016-10-14 DIAGNOSIS — K219 Gastro-esophageal reflux disease without esophagitis: Secondary | ICD-10-CM | POA: Diagnosis not present

## 2016-11-03 DIAGNOSIS — M2012 Hallux valgus (acquired), left foot: Secondary | ICD-10-CM | POA: Diagnosis not present

## 2016-11-03 DIAGNOSIS — L600 Ingrowing nail: Secondary | ICD-10-CM | POA: Diagnosis not present

## 2016-11-03 DIAGNOSIS — B351 Tinea unguium: Secondary | ICD-10-CM | POA: Diagnosis not present

## 2016-11-03 DIAGNOSIS — L851 Acquired keratosis [keratoderma] palmaris et plantaris: Secondary | ICD-10-CM | POA: Diagnosis not present

## 2016-11-03 DIAGNOSIS — E119 Type 2 diabetes mellitus without complications: Secondary | ICD-10-CM | POA: Diagnosis not present

## 2017-04-19 DIAGNOSIS — I10 Essential (primary) hypertension: Secondary | ICD-10-CM | POA: Diagnosis not present

## 2017-04-19 DIAGNOSIS — E1165 Type 2 diabetes mellitus with hyperglycemia: Secondary | ICD-10-CM | POA: Diagnosis not present

## 2017-05-03 ENCOUNTER — Other Ambulatory Visit: Payer: Self-pay | Admitting: Nurse Practitioner

## 2017-05-03 DIAGNOSIS — Z1231 Encounter for screening mammogram for malignant neoplasm of breast: Secondary | ICD-10-CM

## 2017-05-31 DIAGNOSIS — E559 Vitamin D deficiency, unspecified: Secondary | ICD-10-CM | POA: Diagnosis not present

## 2017-05-31 DIAGNOSIS — I10 Essential (primary) hypertension: Secondary | ICD-10-CM | POA: Diagnosis not present

## 2017-05-31 DIAGNOSIS — Z0001 Encounter for general adult medical examination with abnormal findings: Secondary | ICD-10-CM | POA: Diagnosis not present

## 2017-05-31 DIAGNOSIS — E1165 Type 2 diabetes mellitus with hyperglycemia: Secondary | ICD-10-CM | POA: Diagnosis not present

## 2017-07-18 DIAGNOSIS — E1165 Type 2 diabetes mellitus with hyperglycemia: Secondary | ICD-10-CM | POA: Diagnosis not present

## 2017-07-18 DIAGNOSIS — M545 Low back pain: Secondary | ICD-10-CM | POA: Diagnosis not present

## 2017-07-18 DIAGNOSIS — I10 Essential (primary) hypertension: Secondary | ICD-10-CM | POA: Diagnosis not present

## 2017-07-18 DIAGNOSIS — E782 Mixed hyperlipidemia: Secondary | ICD-10-CM | POA: Diagnosis not present

## 2017-11-15 ENCOUNTER — Other Ambulatory Visit: Payer: Self-pay | Admitting: Nurse Practitioner

## 2017-11-15 ENCOUNTER — Encounter: Payer: Self-pay | Admitting: Nurse Practitioner

## 2017-11-15 ENCOUNTER — Ambulatory Visit (INDEPENDENT_AMBULATORY_CARE_PROVIDER_SITE_OTHER): Payer: PPO | Admitting: Nurse Practitioner

## 2017-11-15 VITALS — BP 140/80 | Resp 16 | Ht 64.0 in | Wt 159.0 lb

## 2017-11-15 DIAGNOSIS — M545 Low back pain, unspecified: Secondary | ICD-10-CM

## 2017-11-15 DIAGNOSIS — Z0001 Encounter for general adult medical examination with abnormal findings: Secondary | ICD-10-CM | POA: Diagnosis not present

## 2017-11-15 DIAGNOSIS — E1165 Type 2 diabetes mellitus with hyperglycemia: Secondary | ICD-10-CM

## 2017-11-15 DIAGNOSIS — Z1231 Encounter for screening mammogram for malignant neoplasm of breast: Secondary | ICD-10-CM | POA: Diagnosis not present

## 2017-11-15 DIAGNOSIS — Z23 Encounter for immunization: Secondary | ICD-10-CM

## 2017-11-15 DIAGNOSIS — R300 Dysuria: Secondary | ICD-10-CM

## 2017-11-15 DIAGNOSIS — Z1239 Encounter for other screening for malignant neoplasm of breast: Secondary | ICD-10-CM

## 2017-11-15 LAB — POCT GLYCOSYLATED HEMOGLOBIN (HGB A1C): Hemoglobin A1C: 7.1

## 2017-11-15 MED ORDER — MELOXICAM 7.5 MG PO TABS: 7.5000 mg | ORAL_TABLET | Freq: Every day | ORAL | 3 refills | Status: DC

## 2017-11-15 MED ORDER — PNEUMOCOCCAL VAC POLYVALENT 25 MCG/0.5ML IJ INJ: INJECTION | INTRAMUSCULAR | 0 refills | Status: DC

## 2017-11-15 NOTE — Progress Notes (Signed)
[facility] [place], [place] [address]  Internal MEDICINE  Office Visit Note  Patient Name: Vanessa Gentry  [id number]  [id number]  Date of Service: 12/04/2017  Chief Complaint  Patient presents with  . Back Pain    worse pain with initially standing up and getting started moving.   . Diabetes    elevated since stopping exercise.      The patient is here for annual wellness visit today.. She is reporting some intermittent low back pain. This is worse with exertion. Relieves with rest.  Blood sugars are doing well as is blood pressure. She has no reports of chest pain, palpitations, or shortness of breath.   Pt is here for routine health maintenance examination  Current Medication: Outpatient Encounter Medications as of 11/15/2017  Medication Sig  . atenolol (TENORMIN) 25 MG tablet Take 25 mg by mouth at bedtime.  . calcium carbonate (TUMS - DOSED IN MG ELEMENTAL CALCIUM) 500 MG chewable tablet Chew 1 tablet by mouth daily as needed for indigestion or heartburn.  . metFORMIN (GLUCOPHAGE-XR) 750 MG 24 hr tablet TAKE 1 TABLET BY MOUTH WITH BIGGEST MEAL OF THE DAY.  . [DISCONTINUED] metFORMIN (GLUCOPHAGE) 500 MG tablet Take 500 mg by mouth daily with lunch.  . losartan (COZAAR) 100 MG tablet Take 100 mg by mouth daily with lunch.  . meloxicam (MOBIC) 7.5 MG tablet Take 1 tablet (7.5 mg total) by mouth daily.  . pneumococcal 23 valent vaccine (PNEUMOVAX 23) 25 MCG/0.5ML injection Inject IM once for immunization  . psyllium (METAMUCIL) 58.6 % powder Take 1 packet by mouth 2 (two) times daily.   No facility-administered encounter medications on file as of 11/15/2017.     Surgical History: Past Surgical History:  Procedure Laterality Date  . ABDOMINAL HYSTERECTOMY    . BREAST BIOPSY Left 2011   NEG  . CATARACT EXTRACTION W/PHACO Right 03/13/2015   Procedure: CATARACT EXTRACTION PHACO AND INTRAOCULAR LENS PLACEMENT (IOC);  Surgeon: Leandrew Koyanagi,  MD;  Location: [facility] ORS;  Service: Ophthalmology;  Laterality: Right;  [place]  2:40  AP 20.3   CDE 32.41 cassette lot #   [id number]  . CHOLECYSTECTOMY    . ORIF ANKLE FRACTURE    . UPPER GI ENDOSCOPY      Medical History: Past Medical History:  Diagnosis Date  . Arthritis   . Diabetes mellitus without complication ([place])   . Dysrhythmia   . GERD (gastroesophageal reflux disease)   . Hypertension   . Pneumonia     Family History: Family History  Problem Relation Age of Onset  . Breast cancer Neg Hx       Review of Systems  Constitutional: Negative for activity change, chills, fatigue and unexpected weight change.  HENT: Negative for congestion, postnasal drip, rhinorrhea, sneezing and sore throat.   Eyes: Negative.  Negative for redness.  Respiratory: Negative for cough, chest tightness, shortness of breath and wheezing.   Cardiovascular: Negative for chest pain and palpitations.  Gastrointestinal: Negative for abdominal pain, constipation, diarrhea, nausea and vomiting.  Endocrine:       Blood sugars doing well   Genitourinary: Negative.  Negative for dysuria and frequency.  Musculoskeletal: Positive for arthralgias and back pain. Negative for joint swelling and neck pain.  Skin: Negative for rash.  Allergic/Immunologic: Negative for environmental allergies.  Neurological: Negative for tremors, weakness and numbness.  Hematological: Negative for adenopathy. Does not bruise/bleed easily.  Psychiatric/Behavioral: Negative for behavioral problems (Depression), sleep disturbance and suicidal  ideas. The patient is not nervous/anxious.     Today's Vitals   11/15/17 1045  BP: 140/80  Resp: 16  Weight: 159 lb (72.1 kg)  Height: 5\' 4"  (1.626 m)    Physical Exam  Constitutional: She is oriented to person, place, and time. She appears well-developed and well-nourished. No distress.  HENT:  Head: Normocephalic and atraumatic.  Mouth/Throat: Oropharynx is clear and moist. No  oropharyngeal exudate.  Eyes: Pupils are equal, round, and reactive to light. EOM are normal.  Neck: Normal range of motion. Neck supple. No JVD present. Carotid bruit is not present. No tracheal deviation present. No thyromegaly present.  Cardiovascular: Normal rate, regular rhythm, normal heart sounds and intact distal pulses. Exam reveals no gallop and no friction rub.  No murmur heard. Pulmonary/Chest: Effort normal and breath sounds normal. No respiratory distress. She has no wheezes. She has no rales. She exhibits no tenderness.  Abdominal: Soft. Bowel sounds are normal. There is no tenderness.  Musculoskeletal: Normal range of motion.  Mild intermittent lower back pain. Worse with bending and twisting at the waist. Also worse with reching, pushing, pulling, and lifting with the arms. No visible abnormalities or deformities are noted today.  Lymphadenopathy:    She has no cervical adenopathy.  Neurological: She is alert and oriented to person, place, and time. No cranial nerve deficit.  Skin: Skin is warm and dry. She is not diaphoretic.  Psychiatric: She has a normal mood and affect. Her behavior is normal. Judgment and thought content normal.  Nursing note and vitals reviewed.  LABS: Recent Results (from the past 2160 hour(s))  Urinalysis, Routine w reflex microscopic     Status: Abnormal   Collection Time: 11/15/17 12:00 AM  Result Value Ref Range   Specific Gravity, UA 1.019 1.005 - 1.030   pH, UA 5.0 5.0 - 7.5   Color, UA Yellow Yellow   Appearance Ur Clear Clear   Leukocytes, UA Negative Negative   Protein, UA Trace Negative/Trace   Glucose, UA Negative Negative   Ketones, UA Negative Negative   RBC, UA Trace (A) Negative   Bilirubin, UA Negative Negative   Urobilinogen, Ur 0.2 0.2 - 1.0 mg/dL   Nitrite, UA Negative Negative   Microscopic Examination See below:     Comment: Microscopic was indicated and was performed.  Microscopic Examination     Status: Abnormal    Collection Time: 11/15/17 12:00 AM  Result Value Ref Range   WBC, UA 0-5 0 - 5 /hpf   RBC, UA 0-2 0 - 2 /hpf   Epithelial Cells (non renal) 0-10 0 - 10 /hpf   Casts Present (A) None seen /lpf   Cast Type Hyaline casts N/A   Mucus, UA Present Not Estab.   Bacteria, UA Few None seen/Few  Specimen status report     Status: None   Collection Time: 11/15/17 12:00 AM  Result Value Ref Range   specimen status report Comment     Comment: Please note Please note The date and/or time of collection was not indicated on the requisition as required by state and federal law.  The date of receipt of the specimen was used as the collection date if not supplied. One Specimen Identifier One Specimen Identifier The specimen received included only one patient identifier on the primary collection container.  Our laboratory accrediting agency states "All primary specimen containers must be labeled with 2 identifiers at the time of collection."   POCT HgB A1C  Status: None   Collection Time: 11/15/17  4:48 PM  Result Value Ref Range   Hemoglobin A1C 7.1     Assessment/Plan: 1. Encounter for general adult medical examination with abnormal findings Annual wellness visit today. refer tto eye doctor for diabetic eye exam - Ambulatory referral to Ophthalmology  2. Uncontrolled type 2 diabetes mellitus with hyperglycemia (HCC) - POCT HgB A1C 7.1 today. Continue all diabetic medications as prescribed. Recommend closely following ADA diet to help lower blood sugars. - Ambulatory referral to Ophthalmology  3. Dysuria - Urinalysis, Routine w reflex microscopic  4. Need for vaccination against Streptococcus pneumoniae - pneumococcal 23 valent vaccine (PNEUMOVAX 23) 25 MCG/0.5ML injection; Inject IM once for immunization  Dispense: 2.5 mL; Refill: 0  5. Midline low back pain without sciatica, unspecified chronicity - meloxicam (MOBIC) 7.5 MG tablet; Take 1 tablet (7.5 mg total) by mouth daily.   Dispense: 30 tablet; Refill: 3  6. Screening for breast cancer - MM DIGITAL SCREENING BILATERAL; Future   General Counseling: Vanessa Gentry verbalizes understanding of the findings of todays visit and agrees with plan of treatment. I have discussed any further diagnostic evaluation that may be needed or ordered today. We also reviewed her medications today. she has been encouraged to call the office with any questions or concerns that should arise related to todays visit.   Diabetes Counseling:  1. Addition of ACE inh/ ARB'S for nephroprotection. 2. Diabetic foot care, prevention of complications.  3.Exercise and lose weight.  4. Diabetic eye examination, 5. Monitor blood sugar closlely. nutrition counseling.  6.Sign and symptoms of hypoglycemia including shaking sweating,confusion and headaches.  This patient was seen by Leretha Pol, FNP- C in Collaboration with Dr Lavera Guise as a part of collaborative care agreement    Orders Placed This Encounter  Procedures  . MM DIGITAL SCREENING BILATERAL  . Urinalysis, Routine w reflex microscopic  . Ambulatory referral to Ophthalmology  . POCT HgB A1C    Meds ordered this encounter  Medications  . pneumococcal 23 valent vaccine (PNEUMOVAX 23) 25 MCG/0.5ML injection    Sig: Inject IM once for immunization    Dispense:  2.5 mL    Refill:  0    Order Specific Question:   Supervising Provider    Answer:   Lavera Guise [[id number]]  . meloxicam (MOBIC) 7.5 MG tablet    Sig: Take 1 tablet (7.5 mg total) by mouth daily.    Dispense:  30 tablet    Refill:  3    Order Specific Question:   Supervising Provider    Answer:   Lavera Guise [[id number]]    Time spent: [place], MD  Internal Medicine

## 2017-11-16 LAB — MICROSCOPIC EXAMINATION

## 2017-11-16 LAB — SPECIMEN STATUS REPORT

## 2017-11-16 LAB — URINALYSIS, ROUTINE W REFLEX MICROSCOPIC
Bilirubin, UA: NEGATIVE
GLUCOSE, UA: NEGATIVE
KETONES UA: NEGATIVE
Leukocytes, UA: NEGATIVE
NITRITE UA: NEGATIVE
SPEC GRAV UA: 1.019 (ref 1.005–1.030)
Urobilinogen, Ur: 0.2 mg/dL (ref 0.2–1.0)
pH, UA: 5 (ref 5.0–7.5)

## 2017-12-04 DIAGNOSIS — Z23 Encounter for immunization: Secondary | ICD-10-CM | POA: Insufficient documentation

## 2017-12-04 DIAGNOSIS — Z0001 Encounter for general adult medical examination with abnormal findings: Secondary | ICD-10-CM | POA: Insufficient documentation

## 2017-12-04 DIAGNOSIS — R300 Dysuria: Secondary | ICD-10-CM | POA: Insufficient documentation

## 2017-12-04 DIAGNOSIS — M545 Low back pain, unspecified: Secondary | ICD-10-CM | POA: Insufficient documentation

## 2017-12-04 DIAGNOSIS — E1165 Type 2 diabetes mellitus with hyperglycemia: Secondary | ICD-10-CM | POA: Insufficient documentation

## 2018-02-10 ENCOUNTER — Telehealth: Payer: Self-pay | Admitting: Nurse Practitioner

## 2018-02-10 ENCOUNTER — Other Ambulatory Visit: Payer: Self-pay | Admitting: Nurse Practitioner

## 2018-02-10 DIAGNOSIS — I10 Essential (primary) hypertension: Secondary | ICD-10-CM

## 2018-02-10 MED ORDER — VALSARTAN 80 MG PO TABS: 80.0000 mg | ORAL_TABLET | Freq: Every day | ORAL | 3 refills | Status: DC

## 2018-02-10 NOTE — Telephone Encounter (Signed)
Called pt and left message on voicemail that prescription changed and it is at [facility].

## 2018-02-10 NOTE — Telephone Encounter (Signed)
Changed losartan 100mg  to valsartan 80mg  daily. New rx [facility] to [facility].

## 2018-02-10 NOTE — Progress Notes (Signed)
Changed losartan 100mg  to valsartan 80mg  daily. New rx sent to [facility].

## 2018-03-21 ENCOUNTER — Ambulatory Visit: Payer: Self-pay | Admitting: Nurse Practitioner

## 2018-05-02 ENCOUNTER — Other Ambulatory Visit: Payer: Self-pay

## 2018-05-02 DIAGNOSIS — I10 Essential (primary) hypertension: Secondary | ICD-10-CM

## 2018-05-02 MED ORDER — VALSARTAN 80 MG PO TABS: 80.0000 mg | ORAL_TABLET | Freq: Every day | ORAL | 3 refills | Status: DC

## 2018-05-18 ENCOUNTER — Other Ambulatory Visit: Payer: Self-pay

## 2018-05-18 DIAGNOSIS — I10 Essential (primary) hypertension: Secondary | ICD-10-CM

## 2018-05-18 MED ORDER — VALSARTAN 80 MG PO TABS: 80.0000 mg | ORAL_TABLET | Freq: Every day | ORAL | 3 refills | Status: DC

## 2018-05-19 ENCOUNTER — Other Ambulatory Visit: Payer: Self-pay

## 2018-05-19 DIAGNOSIS — I10 Essential (primary) hypertension: Secondary | ICD-10-CM

## 2018-05-19 MED ORDER — VALSARTAN 80 MG PO TABS: 80.0000 mg | ORAL_TABLET | Freq: Every day | ORAL | 3 refills | Status: DC

## 2018-05-23 ENCOUNTER — Telehealth: Payer: Self-pay | Admitting: Nurse Practitioner

## 2018-05-24 ENCOUNTER — Other Ambulatory Visit: Payer: Self-pay | Admitting: Nurse Practitioner

## 2018-05-24 DIAGNOSIS — I10 Essential (primary) hypertension: Secondary | ICD-10-CM

## 2018-05-24 MED ORDER — VALSARTAN 80 MG PO TABS: 80.0000 mg | ORAL_TABLET | Freq: Every day | ORAL | 3 refills | Status: DC

## 2018-05-24 NOTE — Telephone Encounter (Signed)
Renewed her valsartan and sent to [facility] [address].

## 2018-05-24 NOTE — Progress Notes (Signed)
Renewed her valsartan and sent to [facility] [address].

## 2018-05-26 ENCOUNTER — Encounter: Payer: Self-pay | Admitting: Nurse Practitioner

## 2018-05-26 ENCOUNTER — Ambulatory Visit (INDEPENDENT_AMBULATORY_CARE_PROVIDER_SITE_OTHER): Payer: PPO | Admitting: Nurse Practitioner

## 2018-05-26 VITALS — BP 160/75 | HR 75 | Resp 16 | Ht 64.0 in | Wt 160.0 lb

## 2018-05-26 DIAGNOSIS — I10 Essential (primary) hypertension: Secondary | ICD-10-CM | POA: Diagnosis not present

## 2018-05-26 DIAGNOSIS — M545 Low back pain, unspecified: Secondary | ICD-10-CM

## 2018-05-26 DIAGNOSIS — Z23 Encounter for immunization: Secondary | ICD-10-CM

## 2018-05-26 DIAGNOSIS — E1165 Type 2 diabetes mellitus with hyperglycemia: Secondary | ICD-10-CM | POA: Diagnosis not present

## 2018-05-26 LAB — POCT GLYCOSYLATED HEMOGLOBIN (HGB A1C): Hemoglobin A1C: 7.4 % — AB (ref 4.0–5.6)

## 2018-05-26 MED ORDER — PNEUMOCOCCAL VAC POLYVALENT 25 MCG/0.5ML IJ INJ: INJECTION | INTRAMUSCULAR | 0 refills | Status: DC

## 2018-05-26 NOTE — Progress Notes (Signed)
[facility] [place], [place] [address]  Internal MEDICINE  Office Visit Note  Patient Name: Vanessa Gentry  [id number]  [id number]  Date of Service: 06/04/2018  Chief Complaint  Patient presents with  . Medical Management of Chronic Issues    medication refill  . Diabetes    Diabetes  She presents for her follow-up diabetic visit. She has type 2 diabetes mellitus. No MedicAlert identification noted. Her disease course has been stable. There are no hypoglycemic associated symptoms. Pertinent negatives for hypoglycemia include no dizziness, headaches, nervousness/anxiousness or tremors. There are no diabetic associated symptoms. Pertinent negatives for diabetes include no chest pain, no fatigue, no polydipsia, no polyphagia, no polyuria and no weakness. There are no hypoglycemic complications. Symptoms are stable. There are no diabetic complications. Risk factors for coronary artery disease include diabetes mellitus, dyslipidemia, hypertension and post-menopausal. Current diabetic treatment includes oral agent (monotherapy). She is compliant with treatment all of the time. Her weight is stable. She is following a generally healthy diet. She has not had a previous visit with a dietitian. She participates in exercise intermittently. There is no change in her home blood glucose trend. An ACE inhibitor/angiotensin II receptor blocker is being taken. She does not see a podiatrist.Eye exam is current.       Current Medication: Outpatient Encounter Medications as of 05/26/2018  Medication Sig  . atenolol (TENORMIN) 25 MG tablet Take 25 mg by mouth at bedtime.  . calcium carbonate (TUMS - DOSED IN MG ELEMENTAL CALCIUM) 500 MG chewable tablet Chew 1 tablet by mouth daily as needed for indigestion or heartburn.  . meloxicam (MOBIC) 7.5 MG tablet Take 1 tablet (7.5 mg total) by mouth daily.  . metFORMIN (GLUCOPHAGE-XR) 750 MG 24 hr tablet TAKE 1 TABLET BY MOUTH WITH  BIGGEST MEAL OF THE DAY.  Marland Kitchen pneumococcal 23 valent vaccine (PNEUMOVAX 23) 25 MCG/0.5ML injection Inject IM once for immunization  . psyllium (METAMUCIL) 58.6 % powder Take 1 packet by mouth 2 (two) times daily.  . valsartan (DIOVAN) 80 MG tablet Take 1 tablet (80 mg total) by mouth daily.  . [DISCONTINUED] pneumococcal 23 valent vaccine (PNEUMOVAX 23) 25 MCG/0.5ML injection Inject IM once for immunization   No facility-administered encounter medications on file as of 05/26/2018.     Surgical History: Past Surgical History:  Procedure Laterality Date  . ABDOMINAL HYSTERECTOMY    . BREAST BIOPSY Left 2011   NEG  . CATARACT EXTRACTION W/PHACO Right 03/13/2015   Procedure: CATARACT EXTRACTION PHACO AND INTRAOCULAR LENS PLACEMENT (IOC);  Surgeon: Leandrew Koyanagi, MD;  Location: [facility] ORS;  Service: Ophthalmology;  Laterality: Right;  [place]  2:40  AP 20.3   CDE 32.41 cassette lot #   [id number]  . CHOLECYSTECTOMY    . ORIF ANKLE FRACTURE    . UPPER GI ENDOSCOPY      Medical History: Past Medical History:  Diagnosis Date  . Arthritis   . Diabetes mellitus without complication ([place])   . Dysrhythmia   . GERD (gastroesophageal reflux disease)   . Hypertension   . Pneumonia     Family History: Family History  Problem Relation Age of Onset  . Breast cancer Neg Hx     Social History   Socioeconomic History  . Marital status: Divorced    Spouse name: Not on file  . Number of children: Not on file  . Years of education: Not on file  . Highest education level: Not on file  Occupational History  .  Not on file  Social Needs  . Financial resource strain: Not on file  . Food insecurity:    Worry: Not on file    Inability: Not on file  . Transportation needs:    Medical: Not on file    Non-medical: Not on file  Tobacco Use  . Smoking status: Never Smoker  . Smokeless tobacco: Never Used  Substance and Sexual Activity  . Alcohol use: No    Frequency: Never  . Drug use: No  .  Sexual activity: Not on file  Lifestyle  . Physical activity:    Days per week: Not on file    Minutes per session: Not on file  . Stress: Not on file  Relationships  . Social connections:    Talks on phone: Not on file    Gets together: Not on file    Attends religious service: Not on file    Active member of club or organization: Not on file    Attends meetings of clubs or organizations: Not on file    Relationship status: Not on file  . Intimate partner violence:    Fear of current or ex partner: Not on file    Emotionally abused: Not on file    Physically abused: Not on file    Forced sexual activity: Not on file  Other Topics Concern  . Not on file  Social History Narrative  . Not on file      Review of Systems  Constitutional: Negative for activity change, chills, fatigue and unexpected weight change.  HENT: Negative for congestion, postnasal drip, rhinorrhea, sneezing and sore throat.   Eyes: Negative.  Negative for redness.  Respiratory: Negative for cough, chest tightness, shortness of breath and wheezing.   Cardiovascular: Negative for chest pain and palpitations.  Gastrointestinal: Negative for abdominal pain, constipation, diarrhea, nausea and vomiting.  Endocrine: Negative for cold intolerance, heat intolerance, polydipsia, polyphagia and polyuria.       Blood sugars doing well   Genitourinary: Negative.  Negative for dysuria and frequency.  Musculoskeletal: Positive for arthralgias and back pain. Negative for joint swelling and neck pain.  Skin: Negative for rash.  Allergic/Immunologic: Negative for environmental allergies.  Neurological: Negative for dizziness, tremors, weakness, numbness and headaches.  Hematological: Negative for adenopathy. Does not bruise/bleed easily.  Psychiatric/Behavioral: Negative for behavioral problems (Depression), sleep disturbance and suicidal ideas. The patient is not nervous/anxious.     Vital Signs: BP (!) 160/75 (BP  Location: Right Arm, Patient Position: Sitting, Cuff Size: Normal)   Pulse 75   Resp 16   Ht 5\' 4"  (1.626 m)   Wt 160 lb (72.6 kg)   SpO2 97%   BMI 27.46 kg/m    Physical Exam  Constitutional: She is oriented to person, place, and time. She appears well-developed and well-nourished. No distress.  HENT:  Head: Normocephalic and atraumatic.  Nose: Nose normal.  Mouth/Throat: No oropharyngeal exudate.  Eyes: Pupils are equal, round, and reactive to light. Conjunctivae and EOM are normal.  Neck: Normal range of motion. Neck supple. No JVD present. Carotid bruit is not present. No tracheal deviation present. No thyromegaly present.  Cardiovascular: Normal rate, regular rhythm and normal heart sounds. Exam reveals no gallop and no friction rub.  No murmur heard. Pulmonary/Chest: Effort normal and breath sounds normal. No respiratory distress. She has no wheezes. She has no rales. She exhibits no tenderness.  Abdominal: Soft. Bowel sounds are normal. There is no tenderness.  Musculoskeletal: Normal range of  motion.  Mild intermittent lower back pain. Worse with bending and twisting at the waist. Also worse with reching, pushing, pulling, and lifting with the arms. No visible abnormalities or deformities are noted today.  Lymphadenopathy:    She has no cervical adenopathy.  Neurological: She is alert and oriented to person, place, and time. No cranial nerve deficit.  Skin: Skin is warm and dry. Capillary refill takes less than 2 seconds. She is not diaphoretic.  Psychiatric: She has a normal mood and affect. Her behavior is normal. Judgment and thought content normal.  Nursing note and vitals reviewed.  Assessment/Plan: 1. Uncontrolled type 2 diabetes mellitus with hyperglycemia (HCC) - POCT HgB A1C 7.4 today. Continue metformin as prescribed. Reviewed ADA diet and improtance of following this closely to help lower blood sugars without increasing medication. Refer for diabetic eye exam.  -  Ambulatory referral to Ophthalmology  2. Essential hypertension Stable. Continue bp medication as prescribed.   3. Midline low back pain without sciatica, unspecified chronicity Improved. May continue meloxicam as needed to reduce pain/inflammation.   4. Need for vaccination against Streptococcus pneumoniae - pneumococcal 23 valent vaccine (PNEUMOVAX 23) 25 MCG/0.5ML injection; Inject IM once for immunization  Dispense: 2.5 mL; Refill: 0  General Counseling: Vanessa Gentry verbalizes understanding of the findings of todays visit and agrees with plan of treatment. I have discussed any further diagnostic evaluation that may be needed or ordered today. We also reviewed her medications today. she has been encouraged to call the office with any questions or concerns that should arise related to todays visit.  Diabetes Counseling:  1. Addition of ACE inh/ ARB'S for nephroprotection. Microalbumin is updated  2. Diabetic foot care, prevention of complications. Podiatry consult 3. Exercise and lose weight.  4. Diabetic eye examination, Diabetic eye exam is updated  5. Monitor blood sugar closlely. nutrition counseling.  6. Sign and symptoms of hypoglycemia including shaking sweating,confusion and headaches.  This patient was seen by Leretha Pol FNP Collaboration with Dr Lavera Guise as a part of collaborative care agreement  Orders Placed This Encounter  Procedures  . Ambulatory referral to Ophthalmology  . POCT HgB A1C    Meds ordered this encounter  Medications  . pneumococcal 23 valent vaccine (PNEUMOVAX 23) 25 MCG/0.5ML injection    Sig: Inject IM once for immunization    Dispense:  2.5 mL    Refill:  0    Please give her pneumonia vaccine which her insurance will cover.    Order Specific Question:   Supervising Provider    Answer:   Lavera Guise [[id number]]    Time spent: 58 Minutes      Dr Lavera Guise Internal medicine

## 2018-06-04 DIAGNOSIS — I10 Essential (primary) hypertension: Secondary | ICD-10-CM | POA: Insufficient documentation

## 2018-06-09 DIAGNOSIS — L821 Other seborrheic keratosis: Secondary | ICD-10-CM | POA: Diagnosis not present

## 2018-06-09 DIAGNOSIS — D2372 Other benign neoplasm of skin of left lower limb, including hip: Secondary | ICD-10-CM | POA: Diagnosis not present

## 2018-08-11 DIAGNOSIS — H40003 Preglaucoma, unspecified, bilateral: Secondary | ICD-10-CM | POA: Diagnosis not present

## 2018-08-25 ENCOUNTER — Ambulatory Visit (INDEPENDENT_AMBULATORY_CARE_PROVIDER_SITE_OTHER): Payer: PPO | Admitting: Adult Health

## 2018-08-25 ENCOUNTER — Encounter: Payer: Self-pay | Admitting: Adult Health

## 2018-08-25 VITALS — BP 120/72 | HR 75 | Resp 16 | Ht 64.0 in | Wt 160.0 lb

## 2018-08-25 DIAGNOSIS — E1165 Type 2 diabetes mellitus with hyperglycemia: Secondary | ICD-10-CM | POA: Diagnosis not present

## 2018-08-25 DIAGNOSIS — I10 Essential (primary) hypertension: Secondary | ICD-10-CM

## 2018-08-25 LAB — POCT GLYCOSYLATED HEMOGLOBIN (HGB A1C): Hemoglobin A1C: 7.3 % — AB (ref 4.0–5.6)

## 2018-08-25 MED ORDER — METFORMIN HCL ER 750 MG PO TB24: ORAL_TABLET | ORAL | 2 refills | Status: DC

## 2018-08-25 NOTE — Progress Notes (Signed)
[facility] [place], [place] [address]  Internal MEDICINE  Office Visit Note  Patient Name: Vanessa Gentry  [id number]  [id number]  Date of Service: 08/25/2018  Chief Complaint  Patient presents with  . Hypertension  . Diabetes    HPI  Pt is here for follow up on DM, and HTN.  Patient is generally doing well and denies any issues currently.  Her hemoglobin A1c today is 7.3.  At last check it was 7.4.  Patient remains stable at this time.  Her blood pressures currently well controlled and she denies any chest pain, shortness of breath, or palpitations.  Patient is requesting refills on her medication.   Current Medication: Outpatient Encounter Medications as of 08/25/2018  Medication Sig  . atenolol (TENORMIN) 25 MG tablet Take 25 mg by mouth at bedtime.  . calcium carbonate (TUMS - DOSED IN MG ELEMENTAL CALCIUM) 500 MG chewable tablet Chew 1 tablet by mouth daily as needed for indigestion or heartburn.  . meloxicam (MOBIC) 7.5 MG tablet Take 1 tablet (7.5 mg total) by mouth daily.  . metFORMIN (GLUCOPHAGE-XR) 750 MG 24 hr tablet TAKE 1 TABLET BY MOUTH WITH BIGGEST MEAL OF THE DAY.  Marland Kitchen pneumococcal 23 valent vaccine (PNEUMOVAX 23) 25 MCG/0.5ML injection Inject IM once for immunization  . psyllium (METAMUCIL) 58.6 % powder Take 1 packet by mouth 2 (two) times daily.  . valsartan (DIOVAN) 80 MG tablet Take 1 tablet (80 mg total) by mouth daily.   No facility-administered encounter medications on file as of 08/25/2018.     Surgical History: Past Surgical History:  Procedure Laterality Date  . ABDOMINAL HYSTERECTOMY    . BREAST BIOPSY Left 2011   NEG  . CATARACT EXTRACTION W/PHACO Right 03/13/2015   Procedure: CATARACT EXTRACTION PHACO AND INTRAOCULAR LENS PLACEMENT (IOC);  Surgeon: Leandrew Koyanagi, MD;  Location: [facility] ORS;  Service: Ophthalmology;  Laterality: Right;  [place]  2:40  AP 20.3   CDE 32.41 cassette lot #   [id number]  . CHOLECYSTECTOMY     . ORIF ANKLE FRACTURE    . UPPER GI ENDOSCOPY      Medical History: Past Medical History:  Diagnosis Date  . Arthritis   . Diabetes mellitus without complication ([place])   . Dysrhythmia   . GERD (gastroesophageal reflux disease)   . Hypertension   . Pneumonia     Family History: Family History  Problem Relation Age of Onset  . Breast cancer Neg Hx     Social History   Socioeconomic History  . Marital status: Divorced    Spouse name: Not on file  . Number of children: Not on file  . Years of education: Not on file  . Highest education level: Not on file  Occupational History  . Not on file  Social Needs  . Financial resource strain: Not on file  . Food insecurity:    Worry: Not on file    Inability: Not on file  . Transportation needs:    Medical: Not on file    Non-medical: Not on file  Tobacco Use  . Smoking status: Never Smoker  . Smokeless tobacco: Never Used  Substance and Sexual Activity  . Alcohol use: No    Frequency: Never  . Drug use: No  . Sexual activity: Not on file  Lifestyle  . Physical activity:    Days per week: Not on file    Minutes per session: Not on file  . Stress: Not on file  Relationships  . Social connections:    Talks on phone: Not on file    Gets together: Not on file    Attends religious service: Not on file    Active member of club or organization: Not on file    Attends meetings of clubs or organizations: Not on file    Relationship status: Not on file  . Intimate partner violence:    Fear of current or ex partner: Not on file    Emotionally abused: Not on file    Physically abused: Not on file    Forced sexual activity: Not on file  Other Topics Concern  . Not on file  Social History Narrative  . Not on file      Review of Systems  Constitutional: Negative for chills, fatigue and unexpected weight change.  HENT: Negative for congestion, rhinorrhea, sneezing and sore throat.   Eyes: Negative for photophobia,  pain and redness.  Respiratory: Negative for cough, chest tightness and shortness of breath.   Cardiovascular: Negative for chest pain and palpitations.  Gastrointestinal: Negative for abdominal pain, constipation, diarrhea, nausea and vomiting.  Endocrine: Negative.   Genitourinary: Negative for dysuria and frequency.  Musculoskeletal: Negative for arthralgias, back pain, joint swelling and neck pain.  Skin: Negative for rash.  Allergic/Immunologic: Negative.   Neurological: Negative for tremors and numbness.  Hematological: Negative for adenopathy. Does not bruise/bleed easily.  Psychiatric/Behavioral: Negative for behavioral problems and sleep disturbance. The patient is not nervous/anxious.     Vital Signs: BP 120/72   Pulse 75   Resp 16   Ht 5\' 4"  (1.626 m)   Wt 160 lb (72.6 kg)   SpO2 98%   BMI 27.46 kg/m    Physical Exam Vitals signs and nursing note reviewed.  Constitutional:      General: She is not in acute distress.    Appearance: She is well-developed. She is not diaphoretic.  HENT:     Head: Normocephalic and atraumatic.     Mouth/Throat:     Pharynx: No oropharyngeal exudate.  Eyes:     Pupils: Pupils are equal, round, and reactive to light.  Neck:     Musculoskeletal: Normal range of motion and neck supple.     Thyroid: No thyromegaly.     Vascular: No JVD.     Trachea: No tracheal deviation.  Cardiovascular:     Rate and Rhythm: Normal rate and regular rhythm.     Heart sounds: Normal heart sounds. No murmur. No friction rub. No gallop.   Pulmonary:     Effort: Pulmonary effort is normal. No respiratory distress.     Breath sounds: Normal breath sounds. No wheezing or rales.  Chest:     Chest wall: No tenderness.  Abdominal:     Palpations: Abdomen is soft.     Tenderness: There is no abdominal tenderness. There is no guarding.  Musculoskeletal: Normal range of motion.  Lymphadenopathy:     Cervical: No cervical adenopathy.  Skin:    General:  Skin is warm and dry.  Neurological:     Mental Status: She is alert and oriented to person, place, and time.     Cranial Nerves: No cranial nerve deficit.  Psychiatric:        Behavior: Behavior normal.        Thought Content: Thought content normal.        Judgment: Judgment normal.    Assessment/Plan: 1. Uncontrolled type 2 diabetes mellitus with hyperglycemia (HCC) A1c A.  Patient will continue taking) as prescribed.  Refill divided this visit. - POCT HgB A1C - metFORMIN (GLUCOPHAGE-XR) 750 MG 24 hr tablet; TAKE 1 TABLET BY MOUTH WITH BIGGEST MEAL OF THE DAY.  Dispense: 90 tablet; Refill: 2  2. Essential hypertension Stable, continue current medication administration as provided.  General Counseling: Francille verbalizes understanding of the findings of todays visit and agrees with plan of treatment. I have discussed any further diagnostic evaluation that may be needed or ordered today. We also reviewed her medications today. she has been encouraged to call the office with any questions or concerns that should arise related to todays visit.    Orders Placed This Encounter  Procedures  . POCT HgB A1C    No orders of the defined types were placed in this encounter.   Time spent: 25 Minutes   This patient was seen by Orson Gear AGNP-C in Collaboration with Dr Lavera Guise as a part of collaborative care agreement     Kendell Bane AGNP-C Internal medicine

## 2018-08-29 ENCOUNTER — Encounter: Payer: Self-pay | Admitting: Adult Health

## 2018-10-18 ENCOUNTER — Ambulatory Visit: Admit: 2018-10-18 | Payer: PPO | Admitting: Ophthalmology

## 2018-10-18 SURGERY — PHACOEMULSIFICATION, CATARACT, WITH IOL INSERTION
Anesthesia: Topical | Laterality: Left

## 2018-10-23 ENCOUNTER — Other Ambulatory Visit: Payer: Self-pay

## 2018-10-23 DIAGNOSIS — I10 Essential (primary) hypertension: Secondary | ICD-10-CM

## 2018-10-23 MED ORDER — VALSARTAN 80 MG PO TABS
80.0000 mg | ORAL_TABLET | Freq: Every day | ORAL | 3 refills | Status: DC
Start: 2018-10-23 — End: 2019-01-11

## 2018-10-27 ENCOUNTER — Other Ambulatory Visit: Payer: Self-pay

## 2018-10-27 DIAGNOSIS — I10 Essential (primary) hypertension: Secondary | ICD-10-CM

## 2018-11-13 ENCOUNTER — Telehealth: Payer: Self-pay

## 2018-11-13 ENCOUNTER — Other Ambulatory Visit: Payer: Self-pay | Admitting: Nurse Practitioner

## 2018-11-13 DIAGNOSIS — I10 Essential (primary) hypertension: Secondary | ICD-10-CM

## 2018-11-13 MED ORDER — IRBESARTAN 150 MG PO TABS: 150.0000 mg | ORAL_TABLET | Freq: Every day | ORAL | 3 refills | Status: DC

## 2018-11-13 NOTE — Telephone Encounter (Signed)
Change valsartaqn to irbesartan as valsartan is on backorder. Sent new prescription for irbesartan 150mg  daily to her pharmacy.

## 2018-11-13 NOTE — Progress Notes (Signed)
Change valsartaqn to irbesartan as valsartan is on backorder. Sent new prescription for irbesartan 150mg  daily to her pharmacy.

## 2018-11-14 ENCOUNTER — Other Ambulatory Visit: Payer: Self-pay | Admitting: Nurse Practitioner

## 2018-11-14 DIAGNOSIS — I10 Essential (primary) hypertension: Secondary | ICD-10-CM

## 2018-11-14 MED ORDER — IRBESARTAN 150 MG PO TABS
150.0000 mg | ORAL_TABLET | Freq: Every day | ORAL | 3 refills | Status: DC
Start: 2018-11-14 — End: 2019-01-11

## 2018-11-14 NOTE — Telephone Encounter (Signed)
Pt advised we change med to [facility]

## 2018-12-11 ENCOUNTER — Ambulatory Visit: Payer: Self-pay | Admitting: Nurse Practitioner

## 2019-01-11 ENCOUNTER — Ambulatory Visit (INDEPENDENT_AMBULATORY_CARE_PROVIDER_SITE_OTHER): Payer: PPO | Admitting: Nurse Practitioner

## 2019-01-11 ENCOUNTER — Other Ambulatory Visit: Payer: Self-pay

## 2019-01-11 ENCOUNTER — Encounter: Payer: Self-pay | Admitting: Nurse Practitioner

## 2019-01-11 VITALS — Resp 16 | Ht 64.0 in

## 2019-01-11 DIAGNOSIS — L03119 Cellulitis of unspecified part of limb: Secondary | ICD-10-CM | POA: Diagnosis not present

## 2019-01-11 DIAGNOSIS — L02519 Cutaneous abscess of unspecified hand: Secondary | ICD-10-CM | POA: Diagnosis not present

## 2019-01-11 DIAGNOSIS — E1165 Type 2 diabetes mellitus with hyperglycemia: Secondary | ICD-10-CM | POA: Diagnosis not present

## 2019-01-11 DIAGNOSIS — I10 Essential (primary) hypertension: Secondary | ICD-10-CM | POA: Diagnosis not present

## 2019-01-11 DIAGNOSIS — Z0001 Encounter for general adult medical examination with abnormal findings: Principal | ICD-10-CM

## 2019-01-11 MED ORDER — VALSARTAN 80 MG PO TABS: 80.0000 mg | ORAL_TABLET | Freq: Every day | ORAL | 3 refills | Status: DC

## 2019-01-11 MED ORDER — METFORMIN HCL ER 750 MG PO TB24: ORAL_TABLET | ORAL | 2 refills | Status: DC

## 2019-01-11 MED ORDER — CEPHALEXIN 500 MG PO CAPS: 500.0000 mg | ORAL_CAPSULE | Freq: Four times a day (QID) | ORAL | 0 refills | Status: DC

## 2019-01-11 NOTE — Progress Notes (Signed)
[facility] [place],  [address]  Internal MEDICINE  Telephone Visit  Patient Name: Vanessa Gentry  [id number]  [id number]  Date of Service: 01/29/2019  I connected with the patient at 3:55 by telephone and verified the patients identity using two identifiers.   I discussed the limitations, risks, security and privacy concerns of performing an evaluation and management service by telephone and the availability of in person appointments. I also discussed with the patient that there may be a patient responsible charge related to the service.  The patient expressed understanding and agrees to proceed.    Chief Complaint  Patient presents with  . Telephone Screen  . Diabetes  . Hypertension  . Telephone Assessment    The patient has been contacted via telephone for follow up visit due to concerns for spread of novel coronavirus. The patient states that she has some infection in the left thumb, mostly around the hail. Thumb is warm, pink, and and very tender. States that her thumb is so painful it was hard to go to sleep. Denies laceration, cut, or scrape to the thumb. Has not had ingrown fingernail. This has been going on for past week or so and symptoms are getting worse. She states that she has been putting on neosporin, and this is just not helping. Skin of the thumb is just getting redder and more tender.  States that she has not been checking her blood sugars lately. The battery in her glucose meter is out and she needs to have this replaced. Has been working on her diet, reducing the carbohydrates and sugar in her diet. Will check HgbA1c at her next visit .      Current Medication: Outpatient Encounter Medications as of 01/11/2019  Medication Sig  . atenolol (TENORMIN) 25 MG tablet Take 25 mg by mouth at bedtime.  . calcium carbonate (TUMS - DOSED IN MG ELEMENTAL CALCIUM) 500 MG chewable tablet Chew 1 tablet by mouth daily as needed for indigestion or  heartburn.  . meloxicam (MOBIC) 7.5 MG tablet Take 1 tablet (7.5 mg total) by mouth daily.  . metFORMIN (GLUCOPHAGE-XR) 750 MG 24 hr tablet TAKE 1 TABLET BY MOUTH WITH BIGGEST MEAL OF THE DAY.  Marland Kitchen psyllium (METAMUCIL) 58.6 % powder Take 1 packet by mouth 2 (two) times daily.  . [DISCONTINUED] irbesartan (AVAPRO) 150 MG tablet Take 1 tablet (150 mg total) by mouth daily.  . [DISCONTINUED] metFORMIN (GLUCOPHAGE-XR) 750 MG 24 hr tablet TAKE 1 TABLET BY MOUTH WITH BIGGEST MEAL OF THE DAY.  . cephALEXin (KEFLEX) 500 MG capsule Take 1 capsule (500 mg total) by mouth 4 (four) times daily.  . pneumococcal 23 valent vaccine (PNEUMOVAX 23) 25 MCG/0.5ML injection Inject IM once for immunization (Patient not taking: Reported on 01/11/2019)  . valsartan (DIOVAN) 80 MG tablet Take 1 tablet (80 mg total) by mouth daily.  . [DISCONTINUED] valsartan (DIOVAN) 80 MG tablet Take 1 tablet (80 mg total) by mouth daily. (Patient not taking: Reported on 01/11/2019)   No facility-administered encounter medications on file as of 01/11/2019.     Surgical History: Past Surgical History:  Procedure Laterality Date  . ABDOMINAL HYSTERECTOMY    . BREAST BIOPSY Left 2011   NEG  . CATARACT EXTRACTION W/PHACO Right 03/13/2015   Procedure: CATARACT EXTRACTION PHACO AND INTRAOCULAR LENS PLACEMENT (IOC);  Surgeon: Leandrew Koyanagi, MD;  Location: [facility] ORS;  Service: Ophthalmology;  Laterality: Right;  [place]  2:40  AP 20.3   CDE 32.41  cassette lot #   [id number]  . CHOLECYSTECTOMY    . ORIF ANKLE FRACTURE    . UPPER GI ENDOSCOPY      Medical History: Past Medical History:  Diagnosis Date  . Arthritis   . Diabetes mellitus without complication ([place])   . Dysrhythmia   . GERD (gastroesophageal reflux disease)   . Hypertension   . Pneumonia     Family History: Family History  Problem Relation Age of Onset  . Breast cancer Neg Hx     Social History   Socioeconomic History  . Marital status: Divorced    Spouse name: Not  on file  . Number of children: Not on file  . Years of education: Not on file  . Highest education level: Not on file  Occupational History  . Not on file  Social Needs  . Financial resource strain: Not on file  . Food insecurity:    Worry: Not on file    Inability: Not on file  . Transportation needs:    Medical: Not on file    Non-medical: Not on file  Tobacco Use  . Smoking status: Never Smoker  . Smokeless tobacco: Never Used  Substance and Sexual Activity  . Alcohol use: No    Frequency: Never  . Drug use: No  . Sexual activity: Not on file  Lifestyle  . Physical activity:    Days per week: Not on file    Minutes per session: Not on file  . Stress: Not on file  Relationships  . Social connections:    Talks on phone: Not on file    Gets together: Not on file    Attends religious service: Not on file    Active member of club or organization: Not on file    Attends meetings of clubs or organizations: Not on file    Relationship status: Not on file  . Intimate partner violence:    Fear of current or ex partner: Not on file    Emotionally abused: Not on file    Physically abused: Not on file    Forced sexual activity: Not on file  Other Topics Concern  . Not on file  Social History Narrative  . Not on file      Review of Systems  Constitutional: Negative for activity change, chills, fatigue and unexpected weight change.  HENT: Negative for congestion, postnasal drip, rhinorrhea, sneezing and sore throat.   Respiratory: Negative for cough, chest tightness, shortness of breath and wheezing.   Cardiovascular: Negative for chest pain and palpitations.  Gastrointestinal: Negative for abdominal pain, constipation, diarrhea, nausea and vomiting.  Endocrine: Negative for cold intolerance, heat intolerance, polydipsia and polyuria.       Blood sugars doing well   Musculoskeletal: Positive for arthralgias and back pain. Negative for joint swelling and neck pain.  Skin:  Negative for rash.       Redness, swelling and tenderness of the tip of the left thumb. No injury, lesion, or laceration is noted.   Allergic/Immunologic: Negative for environmental allergies.  Neurological: Negative for dizziness, tremors, weakness, numbness and headaches.  Hematological: Negative for adenopathy. Does not bruise/bleed easily.  Psychiatric/Behavioral: Negative for behavioral problems (Depression), sleep disturbance and suicidal ideas. The patient is not nervous/anxious.     Today's Vitals   01/11/19 1542  Resp: 16  Height: 5\' 4"  (1.626 m)   Body mass index is 27.46 kg/m.  Observation/Objective:   The patient is alert and oriented. She is pleasant  and answers all questions appropriately. Breathing is non-labored. She is in no acute distress at this time.  The tip of the left thumb, around the fingernail and the nail bed are red, swollen, and tender when she touches it. There is no lesion, laceration, or noted injury to the area.    Assessment/Plan:  1. Cellulitis and abscess of hand Start cephalexin 500mg  three times daily for next 10 days. Advised her to notify the office if there was nor improvement in next three to four days. She voiced understanding.  - cephALEXin (KEFLEX) 500 MG capsule; Take 1 capsule (500 mg total) by mouth 4 (four) times daily.  Dispense: 30 capsule; Refill: 0  2. Uncontrolled type 2 diabetes mellitus with hyperglycemia (HCC) Well managed blood sugars. Continue diabetic medication as prescribed. Will check HgbA1c at her next visit.  - metFORMIN (GLUCOPHAGE-XR) 750 MG 24 hr tablet; TAKE 1 TABLET BY MOUTH WITH BIGGEST MEAL OF THE DAY.  Dispense: 90 tablet; Refill: 2  3. Essential hypertension Change back to valsartan 80mg  daily. DASH diet recommended. Reassess at next visit.  - valsartan (DIOVAN) 80 MG tablet; Take 1 tablet (80 mg total) by mouth daily.  Dispense: 30 tablet; Refill: 3  General Counseling: Vanessa Gentry verbalizes understanding of  the findings of today's phone visit and agrees with plan of treatment. I have discussed any further diagnostic evaluation that may be needed or ordered today. We also reviewed her medications today. she has been encouraged to call the office with any questions or concerns that should arise related to todays visit.  Hypertension Counseling:   The following hypertensive lifestyle modification were recommended and discussed:  1. Limiting alcohol intake to less than 1 oz/day of ethanol:(24 oz of beer or 8 oz of wine or 2 oz of 100-proof whiskey). 2. Take baby ASA 81 mg daily. 3. Importance of regular aerobic exercise and losing weight. 4. Reduce dietary saturated fat and cholesterol intake for overall cardiovascular health. 5. Maintaining adequate dietary potassium, calcium, and magnesium intake. 6. Regular monitoring of the blood pressure. 7. Reduce sodium intake to less than 100 mmol/day (less than 2.3 gm of sodium or less than 6 gm of sodium choride)   This patient was seen by [place] with Dr Lavera Guise as a part of collaborative care agreement  Meds ordered this encounter  Medications  . cephALEXin (KEFLEX) 500 MG capsule    Sig: Take 1 capsule (500 mg total) by mouth 4 (four) times daily.    Dispense:  30 capsule    Refill:  0    Order Specific Question:   Supervising Provider    Answer:   Lavera Guise [[id number]]  . metFORMIN (GLUCOPHAGE-XR) 750 MG 24 hr tablet    Sig: TAKE 1 TABLET BY MOUTH WITH BIGGEST MEAL OF THE DAY.    Dispense:  90 tablet    Refill:  2    Order Specific Question:   Supervising Provider    Answer:   Lavera Guise [[id number]]  . valsartan (DIOVAN) 80 MG tablet    Sig: Take 1 tablet (80 mg total) by mouth daily.    Dispense:  30 tablet    Refill:  3    Please d/c irbesartan.    Order Specific Question:   Supervising Provider    Answer:   Lavera Guise [[id number]]    Time spent: 25 Minutes - spent 15 minutes reviewing the chart with the patient.      Dr  Lavera Guise Internal medicine

## 2019-01-19 ENCOUNTER — Ambulatory Visit: Payer: Self-pay | Admitting: Nurse Practitioner

## 2019-03-15 ENCOUNTER — Encounter: Payer: Self-pay | Admitting: Nurse Practitioner

## 2019-03-15 ENCOUNTER — Ambulatory Visit (INDEPENDENT_AMBULATORY_CARE_PROVIDER_SITE_OTHER): Payer: PPO | Admitting: Nurse Practitioner

## 2019-03-15 ENCOUNTER — Other Ambulatory Visit: Payer: Self-pay

## 2019-03-15 VITALS — BP 142/69 | HR 85 | Temp 99.0°F | Resp 16 | Ht 64.0 in | Wt 155.6 lb

## 2019-03-15 DIAGNOSIS — E1165 Type 2 diabetes mellitus with hyperglycemia: Secondary | ICD-10-CM | POA: Diagnosis not present

## 2019-03-15 DIAGNOSIS — R300 Dysuria: Secondary | ICD-10-CM

## 2019-03-15 DIAGNOSIS — Z0001 Encounter for general adult medical examination with abnormal findings: Secondary | ICD-10-CM

## 2019-03-15 DIAGNOSIS — I10 Essential (primary) hypertension: Secondary | ICD-10-CM

## 2019-03-15 LAB — POCT GLYCOSYLATED HEMOGLOBIN (HGB A1C): Hemoglobin A1C: 7 % — AB (ref 4.0–5.6)

## 2019-03-15 MED ORDER — VALSARTAN 80 MG PO TABS: 80.0000 mg | ORAL_TABLET | Freq: Every day | ORAL | 3 refills | Status: DC

## 2019-03-15 MED ORDER — METFORMIN HCL 850 MG PO TABS: 850.0000 mg | ORAL_TABLET | Freq: Two times a day (BID) | ORAL | 3 refills | Status: DC

## 2019-03-15 NOTE — Progress Notes (Signed)
[facility] [place], [place] [address]  Internal MEDICINE  Office Visit Note  Patient Name: Vanessa Gentry  [id number]  [id number]  Date of Service: 03/21/2019   Pt is here for routine health maintenance examination   Chief Complaint  Patient presents with  . Medicare Wellness    pt have concerns about recall on metformin  . Hypertension  . Gastroesophageal Reflux  . Diabetes     The patient is here for health maintenance exam. Blood sugars are doing well, improved. Metformin dosing she is taking is being recalled. She is currently taking Metformin ER 750mg  daily. She states states that she is feeling well. She does feel stressed due to restrictions related to COVID 19. Feels very isolated. Blood pressure is well managed.    Current Medication: Outpatient Encounter Medications as of 03/15/2019  Medication Sig  . calcium carbonate (TUMS - DOSED IN MG ELEMENTAL CALCIUM) 500 MG chewable tablet Chew 1 tablet by mouth daily as needed for indigestion or heartburn.  . valsartan (DIOVAN) 80 MG tablet Take 1 tablet (80 mg total) by mouth daily.  Marland Kitchen VITAMIN D PO Take by mouth.  . [DISCONTINUED] metFORMIN (GLUCOPHAGE-XR) 750 MG 24 hr tablet TAKE 1 TABLET BY MOUTH WITH BIGGEST MEAL OF THE DAY.  . [DISCONTINUED] valsartan (DIOVAN) 80 MG tablet Take 1 tablet (80 mg total) by mouth daily.  . metFORMIN (GLUCOPHAGE) 850 MG tablet Take 1 tablet (850 mg total) by mouth 2 (two) times daily with a meal.  . [DISCONTINUED] atenolol (TENORMIN) 25 MG tablet Take 25 mg by mouth at bedtime.  . [DISCONTINUED] cephALEXin (KEFLEX) 500 MG capsule Take 1 capsule (500 mg total) by mouth 4 (four) times daily. (Patient not taking: Reported on 03/15/2019)  . [DISCONTINUED] meloxicam (MOBIC) 7.5 MG tablet Take 1 tablet (7.5 mg total) by mouth daily. (Patient not taking: Reported on 03/15/2019)  . [DISCONTINUED] pneumococcal 23 valent vaccine (PNEUMOVAX 23) 25 MCG/0.5ML injection Inject IM  once for immunization (Patient not taking: Reported on 01/11/2019)  . [DISCONTINUED] psyllium (METAMUCIL) 58.6 % powder Take 1 packet by mouth 2 (two) times daily.   No facility-administered encounter medications on file as of 03/15/2019.     Surgical History: Past Surgical History:  Procedure Laterality Date  . ABDOMINAL HYSTERECTOMY    . BREAST BIOPSY Left 2011   NEG  . CATARACT EXTRACTION W/PHACO Right 03/13/2015   Procedure: CATARACT EXTRACTION PHACO AND INTRAOCULAR LENS PLACEMENT (IOC);  Surgeon: Leandrew Koyanagi, MD;  Location: [facility] ORS;  Service: Ophthalmology;  Laterality: Right;  [place]  2:40  AP 20.3   CDE 32.41 cassette lot #   [id number]  . CHOLECYSTECTOMY    . ORIF ANKLE FRACTURE    . UPPER GI ENDOSCOPY      Medical History: Past Medical History:  Diagnosis Date  . Arthritis   . Diabetes mellitus without complication ([place])   . Dysrhythmia   . GERD (gastroesophageal reflux disease)   . Hypertension   . Pneumonia     Family History: Family History  Problem Relation Age of Onset  . Breast cancer Neg Hx       Review of Systems  Constitutional: Negative for activity change, chills, fatigue and unexpected weight change.  HENT: Negative for congestion, postnasal drip, rhinorrhea, sneezing and sore throat.   Respiratory: Negative for cough, chest tightness, shortness of breath and wheezing.   Cardiovascular: Negative for chest pain and palpitations.  Gastrointestinal: Negative for abdominal pain, constipation, diarrhea, nausea and vomiting.  Endocrine: Negative for cold intolerance, heat intolerance, polydipsia and polyuria.       Blood sugars doing well   Musculoskeletal: Negative for arthralgias, back pain, joint swelling and neck pain.  Skin: Negative for rash.  Allergic/Immunologic: Negative for environmental allergies.  Neurological: Negative for dizziness, tremors, weakness, numbness and headaches.  Hematological: Negative for adenopathy. Does not bruise/bleed  easily.  Psychiatric/Behavioral: Positive for dysphoric mood. Negative for behavioral problems (Depression), sleep disturbance and suicidal ideas. The patient is not nervous/anxious.     Today's Vitals   03/15/19 1448  BP: (!) 142/69  Pulse: 85  Resp: 16  Temp: 99 F (37.2 C)  SpO2: 96%  Weight: 155 lb 9.6 oz (70.6 kg)  Height: 5\' 4"  (1.626 m)   Body mass index is 26.71 kg/m.  Physical Exam Vitals signs and nursing note reviewed.  Constitutional:      General: She is not in acute distress.    Appearance: Normal appearance. She is well-developed. She is not diaphoretic.  HENT:     Head: Normocephalic and atraumatic.     Mouth/Throat:     Pharynx: No oropharyngeal exudate.  Eyes:     Conjunctiva/sclera: Conjunctivae normal.     Pupils: Pupils are equal, round, and reactive to light.  Neck:     Musculoskeletal: Normal range of motion and neck supple.     Thyroid: No thyromegaly.     Vascular: No carotid bruit or JVD.     Trachea: No tracheal deviation.  Cardiovascular:     Rate and Rhythm: Normal rate and regular rhythm.     Pulses: Normal pulses.          Dorsalis pedis pulses are 2+ on the right side and 2+ on the left side.       Posterior tibial pulses are 2+ on the right side and 2+ on the left side.     Heart sounds: Normal heart sounds. No murmur. No friction rub. No gallop.   Pulmonary:     Effort: Pulmonary effort is normal. No respiratory distress.     Breath sounds: Normal breath sounds. No wheezing or rales.  Chest:     Chest wall: No tenderness.     Breasts:        Right: Normal. No swelling, bleeding, inverted nipple, mass, nipple discharge, skin change or tenderness.        Left: Normal. No swelling, bleeding, inverted nipple, mass, nipple discharge, skin change or tenderness.  Abdominal:     General: Bowel sounds are normal.     Palpations: Abdomen is soft.     Tenderness: There is no abdominal tenderness.  Musculoskeletal: Normal range of motion.      Right lower leg: No edema.     Right foot: Normal range of motion.     Left foot: Normal range of motion.     Comments: Mild intermittent lower back pain. Worse with bending and twisting at the waist. Also worse with reching, pushing, pulling, and lifting with the arms. No visible abnormalities or deformities are noted today.  Feet:     Right foot:     Protective Sensation: 10 sites tested. 10 sites sensed.     Skin integrity: Skin integrity normal.     Toenail Condition: Right toenails are normal.     Left foot:     Protective Sensation: 10 sites tested. 10 sites sensed.     Skin integrity: Skin integrity normal.     Toenail Condition: Left toenails are  normal.  Lymphadenopathy:     Cervical: No cervical adenopathy.  Skin:    General: Skin is warm and dry.     Capillary Refill: Capillary refill takes less than 2 seconds.  Neurological:     General: No focal deficit present.     Mental Status: She is alert and oriented to person, place, and time.     Cranial Nerves: No cranial nerve deficit.  Psychiatric:        Behavior: Behavior normal.        Thought Content: Thought content normal.        Judgment: Judgment normal.    Depression screen [facility] 2/9 03/15/2019 01/11/2019 08/25/2018 05/26/2018 11/15/2017  Decreased Interest 0 0 1 0 0  Down, Depressed, Hopeless 0 0 0 0 0  PHQ - 2 Score 0 0 1 0 0    Functional Status Survey: Is the patient deaf or have difficulty hearing?: Yes(occasionally) Does the patient have difficulty seeing, even when wearing glasses/contacts?: No Does the patient have difficulty concentrating, remembering, or making decisions?: Yes(remembering) Does the patient have difficulty walking or climbing stairs?: No Does the patient have difficulty dressing or bathing?: No Does the patient have difficulty doing errands alone such as visiting a doctor's office or shopping?: No  MMSE - Mini Mental State Exam 03/15/2019  Orientation to time 5  Orientation to Place 5   Registration 3  Attention/ Calculation 5  Recall 3  Language- name 2 objects 2  Language- repeat 1  Language- follow 3 step command 3  Language- read & follow direction 1  Write a sentence 1  Copy design 1  Total score 30    Fall Risk  03/15/2019 01/11/2019 08/25/2018 05/26/2018 11/15/2017  Falls in the past year? 0 0 0 No No  Number falls in past yr: - 0 0 - -  Injury with Fall? - - 0 - -      LABS: Recent Results (from the past 2160 hour(s))  UA/M w/rflx Culture, Routine     Status: None   Collection Time: 03/15/19  2:30 AM   Specimen: Urine   URINE  Result Value Ref Range   Specific Gravity, UA 1.008 1.005 - 1.030   pH, UA 5.0 5.0 - 7.5   Color, UA Yellow Yellow   Appearance Ur Clear Clear   Leukocytes,UA Negative Negative   Protein,UA Negative Negative/Trace   Glucose, UA Negative Negative   Ketones, UA Negative Negative   RBC, UA Negative Negative   Bilirubin, UA Negative Negative   Urobilinogen, Ur 0.2 0.2 - 1.0 mg/dL   Nitrite, UA Negative Negative   Microscopic Examination Comment     Comment: Microscopic follows if indicated.   Microscopic Examination See below:     Comment: Microscopic was indicated and was performed.   Urinalysis Reflex Comment     Comment: This specimen will not reflex to a Urine Culture.  Microscopic Examination     Status: None   Collection Time: 03/15/19  2:30 AM   URINE  Result Value Ref Range   WBC, UA 0-5 0 - 5 /hpf   RBC 0-2 0 - 2 /hpf   Epithelial Cells (non renal) None seen 0 - 10 /hpf   Casts None seen None seen /lpf   Mucus, UA Present Not Estab.   Bacteria, UA Few None seen/Few  POCT HgB A1C     Status: Abnormal   Collection Time: 03/15/19  3:07 PM  Result Value Ref Range   Hemoglobin  A1C 7.0 (A) 4.0 - 5.6 %   HbA1c POC (<> result, manual entry)     HbA1c, POC (prediabetic range)     HbA1c, POC (controlled diabetic range)     Assessment/Plan:  1. Encounter for general adult medical examination with abnormal  findings Annual health maintenance exam today  2. Uncontrolled type 2 diabetes mellitus with hyperglycemia (HCC) - POCT HgB A1C 7.0 today. Due to backorder, will change metformin XR 750mg  to metformin 850mg  bid. Continue to follow diet and lifestyle modifications necessary for good blood sugar control. - metFORMIN (GLUCOPHAGE) 850 MG tablet; Take 1 tablet (850 mg total) by mouth 2 (two) times daily with a meal.  Dispense: 60 tablet; Refill: 3  3. Essential hypertension Stable. Continue bp medication as prescribed  - valsartan (DIOVAN) 80 MG tablet; Take 1 tablet (80 mg total) by mouth daily.  Dispense: 30 tablet; Refill: 3  4. Dysuria - UA/M w/rflx Culture, Routine  General Counseling: Daniya verbalizes understanding of the findings of todays visit and agrees with plan of treatment. I have discussed any further diagnostic evaluation that may be needed or ordered today. We also reviewed her medications today. she has been encouraged to call the office with any questions or concerns that should arise related to todays visit.    Counseling:  Diabetes Counseling:  1. Addition of ACE inh/ ARB'S for nephroprotection. Microalbumin is updated  2. Diabetic foot care, prevention of complications. Podiatry consult 3. Exercise and lose weight.  4. Diabetic eye examination, Diabetic eye exam is updated  5. Monitor blood sugar closlely. nutrition counseling.  6. Sign and symptoms of hypoglycemia including shaking sweating,confusion and headaches.   This patient was seen by Leretha Pol FNP Collaboration with Dr Lavera Guise as a part of collaborative care agreement  Orders Placed This Encounter  Procedures  . Microscopic Examination  . UA/M w/rflx Culture, Routine  . POCT HgB A1C    Meds ordered this encounter  Medications  . metFORMIN (GLUCOPHAGE) 850 MG tablet    Sig: Take 1 tablet (850 mg total) by mouth 2 (two) times daily with a meal.    Dispense:  60 tablet    Refill:  3     Please note change in dosing due to recall.    Order Specific Question:   Supervising Provider    Answer:   Lavera Guise [[id number]]  . valsartan (DIOVAN) 80 MG tablet    Sig: Take 1 tablet (80 mg total) by mouth daily.    Dispense:  30 tablet    Refill:  3    Please d/c irbesartan.    Order Specific Question:   Supervising Provider    Answer:   Lavera Guise [[id number]]    Time spent: [place], MD  Internal Medicine

## 2019-03-16 LAB — UA/M W/RFLX CULTURE, ROUTINE
Bilirubin, UA: NEGATIVE
Glucose, UA: NEGATIVE
Ketones, UA: NEGATIVE
Leukocytes,UA: NEGATIVE
Nitrite, UA: NEGATIVE
Protein,UA: NEGATIVE
RBC, UA: NEGATIVE
Specific Gravity, UA: 1.008 (ref 1.005–1.030)
Urobilinogen, Ur: 0.2 mg/dL (ref 0.2–1.0)
pH, UA: 5 (ref 5.0–7.5)

## 2019-03-16 LAB — MICROSCOPIC EXAMINATION
Casts: NONE SEEN /lpf
Epithelial Cells (non renal): NONE SEEN /hpf (ref 0–10)

## 2019-04-16 ENCOUNTER — Other Ambulatory Visit: Payer: Self-pay | Admitting: Internal Medicine

## 2019-04-16 DIAGNOSIS — I10 Essential (primary) hypertension: Secondary | ICD-10-CM

## 2019-04-16 MED ORDER — VALSARTAN 80 MG PO TABS: 80.0000 mg | ORAL_TABLET | Freq: Every day | ORAL | 3 refills | Status: DC

## 2019-06-13 ENCOUNTER — Other Ambulatory Visit: Payer: Self-pay

## 2019-06-13 DIAGNOSIS — E1165 Type 2 diabetes mellitus with hyperglycemia: Secondary | ICD-10-CM

## 2019-06-13 MED ORDER — METFORMIN HCL 850 MG PO TABS: 850.0000 mg | ORAL_TABLET | Freq: Two times a day (BID) | ORAL | 3 refills | Status: DC

## 2019-06-19 ENCOUNTER — Ambulatory Visit (INDEPENDENT_AMBULATORY_CARE_PROVIDER_SITE_OTHER): Payer: PPO | Admitting: Nurse Practitioner

## 2019-06-19 ENCOUNTER — Other Ambulatory Visit: Payer: Self-pay

## 2019-06-19 ENCOUNTER — Encounter: Payer: Self-pay | Admitting: Nurse Practitioner

## 2019-06-19 VITALS — BP 142/95 | HR 103 | Temp 96.8°F | Resp 16 | Ht 64.0 in | Wt 151.4 lb

## 2019-06-19 DIAGNOSIS — I10 Essential (primary) hypertension: Secondary | ICD-10-CM | POA: Diagnosis not present

## 2019-06-19 DIAGNOSIS — E1165 Type 2 diabetes mellitus with hyperglycemia: Secondary | ICD-10-CM | POA: Diagnosis not present

## 2019-06-19 DIAGNOSIS — B351 Tinea unguium: Secondary | ICD-10-CM | POA: Diagnosis not present

## 2019-06-19 LAB — POCT GLYCOSYLATED HEMOGLOBIN (HGB A1C): Hemoglobin A1C: 6.2 % — AB (ref 4.0–5.6)

## 2019-06-19 MED ORDER — VALSARTAN 80 MG PO TABS: 80.0000 mg | ORAL_TABLET | Freq: Every day | ORAL | 3 refills | Status: DC

## 2019-06-19 NOTE — Progress Notes (Signed)
[facility] [place], [place] [address]  Internal MEDICINE  Office Visit Note  Patient Name: Vanessa Gentry  [id number]  [id number]  Date of Service: 07/01/2019  Chief Complaint  Patient presents with  . Diabetes  . Hypertension  . Gastroesophageal Reflux    The patient is here for routine follow up of diabetes. Had to switch from Metformin XR 750daily to metformin 850mg  twice daily. Her HgbA1c is 6.2 today, down from 7.0 at last check. Doing well.  -thickened toenails on the great toes of both feet. Used to see podiatry but has been some time.       Current Medication: Outpatient Encounter Medications as of 06/19/2019  Medication Sig  . metFORMIN (GLUCOPHAGE) 850 MG tablet Take 1 tablet (850 mg total) by mouth 2 (two) times daily with a meal.  . VITAMIN D PO Take by mouth.  . [DISCONTINUED] valsartan (DIOVAN) 80 MG tablet Take 1 tablet (80 mg total) by mouth daily.  . [DISCONTINUED] calcium carbonate (TUMS - DOSED IN MG ELEMENTAL CALCIUM) 500 MG chewable tablet Chew 1 tablet by mouth daily as needed for indigestion or heartburn.   No facility-administered encounter medications on file as of 06/19/2019.     Surgical History: Past Surgical History:  Procedure Laterality Date  . ABDOMINAL HYSTERECTOMY    . BREAST BIOPSY Left 2011   NEG  . CATARACT EXTRACTION W/PHACO Right 03/13/2015   Procedure: CATARACT EXTRACTION PHACO AND INTRAOCULAR LENS PLACEMENT (IOC);  Surgeon: Leandrew Koyanagi, MD;  Location: [facility] ORS;  Service: Ophthalmology;  Laterality: Right;  [place]  2:40  AP 20.3   CDE 32.41 cassette lot #   [id number]  . CHOLECYSTECTOMY    . ORIF ANKLE FRACTURE    . UPPER GI ENDOSCOPY      Medical History: Past Medical History:  Diagnosis Date  . Arthritis   . Diabetes mellitus without complication ([place])   . Dysrhythmia   . GERD (gastroesophageal reflux disease)   . Hypertension   . Pneumonia     Family History: Family History  Problem  Relation Age of Onset  . Breast cancer Neg Hx     Social History   Socioeconomic History  . Marital status: Divorced    Spouse name: Not on file  . Number of children: Not on file  . Years of education: Not on file  . Highest education level: Not on file  Occupational History  . Not on file  Social Needs  . Financial resource strain: Not on file  . Food insecurity    Worry: Not on file    Inability: Not on file  . Transportation needs    Medical: Not on file    Non-medical: Not on file  Tobacco Use  . Smoking status: Never Smoker  . Smokeless tobacco: Never Used  Substance and Sexual Activity  . Alcohol use: No    Frequency: Never  . Drug use: No  . Sexual activity: Not on file  Lifestyle  . Physical activity    Days per week: Not on file    Minutes per session: Not on file  . Stress: Not on file  Relationships  . Social Herbalist on phone: Not on file    Gets together: Not on file    Attends religious service: Not on file    Active member of club or organization: Not on file    Attends meetings of clubs or organizations: Not on file  Relationship status: Not on file  . Intimate partner violence    Fear of current or ex partner: Not on file    Emotionally abused: Not on file    Physically abused: Not on file    Forced sexual activity: Not on file  Other Topics Concern  . Not on file  Social History Narrative  . Not on file      Review of Systems  Constitutional: Negative for activity change, chills, fatigue and unexpected weight change.  HENT: Negative for congestion, postnasal drip, rhinorrhea, sneezing and sore throat.   Respiratory: Negative for cough, chest tightness, shortness of breath and wheezing.   Cardiovascular: Negative for chest pain and palpitations.  Gastrointestinal: Negative for abdominal pain, constipation, diarrhea, nausea and vomiting.  Endocrine: Negative for cold intolerance, heat intolerance, polydipsia and polyuria.        Improved blood sugars since her last visit .  Musculoskeletal: Negative for arthralgias, back pain, joint swelling and neck pain.  Skin: Negative for rash.       Thickened toenails of bilateral great toes.   Allergic/Immunologic: Negative for environmental allergies.  Neurological: Negative for dizziness, tremors, weakness, numbness and headaches.  Hematological: Negative for adenopathy. Does not bruise/bleed easily.  Psychiatric/Behavioral: Positive for dysphoric mood. Negative for behavioral problems (Depression), sleep disturbance and suicidal ideas. The patient is not nervous/anxious.        Today's Vitals   06/19/19 1337  BP: (!) 142/95  Pulse: (!) 103  Resp: 16  Temp: (!) 96.8 F (36 C)  SpO2: 97%  Weight: 151 lb 6.4 oz (68.7 kg)  Height: 5\' 4"  (1.626 m)   Body mass index is 25.99 kg/m.   Physical Exam Vitals signs and nursing note reviewed.  Constitutional:      General: She is not in acute distress.    Appearance: Normal appearance. She is well-developed. She is not diaphoretic.  HENT:     Head: Normocephalic and atraumatic.     Mouth/Throat:     Pharynx: No oropharyngeal exudate.  Eyes:     Pupils: Pupils are equal, round, and reactive to light.  Neck:     Musculoskeletal: Normal range of motion and neck supple.     Thyroid: No thyromegaly.     Vascular: No carotid bruit or JVD.     Trachea: No tracheal deviation.  Cardiovascular:     Rate and Rhythm: Normal rate and regular rhythm.     Heart sounds: Normal heart sounds. No murmur. No friction rub. No gallop.   Pulmonary:     Effort: Pulmonary effort is normal. No respiratory distress.     Breath sounds: Normal breath sounds. No wheezing or rales.  Chest:     Chest wall: No tenderness.  Abdominal:     Palpations: Abdomen is soft.     Tenderness: There is no abdominal tenderness. There is no guarding.  Musculoskeletal: Normal range of motion.  Lymphadenopathy:     Cervical: No cervical adenopathy.   Skin:    General: Skin is warm and dry.     Comments: Thickened and long great toenails, bilaterally.   Neurological:     Mental Status: She is alert and oriented to person, place, and time.     Cranial Nerves: No cranial nerve deficit.  Psychiatric:        Behavior: Behavior normal.        Thought Content: Thought content normal.        Judgment: Judgment normal.    Assessment/Plan:  1. Type 2 diabetes mellitus with hyperglycemia, without long-term current use of insulin (HCC) - POCT HgB A1C 6.2 today. Continue diabetic medication as prescribed. Refer to podiatry for diabetic foot exam and toenail care.  - Ambulatory referral to Podiatry  2. Essential hypertension Stable. Continue bo medication as prescribed   3. Tinea unguium Refer to podiatry for further evaluation and treatment  General Counseling: Ari verbalizes understanding of the findings of todays visit and agrees with plan of treatment. I have discussed any further diagnostic evaluation that may be needed or ordered today. We also reviewed her medications today. she has been encouraged to call the office with any questions or concerns that should arise related to todays visit.  Diabetes Counseling:  1. Addition of ACE inh/ ARB'S for nephroprotection. Microalbumin is updated  2. Diabetic foot care, prevention of complications. Podiatry consult 3. Exercise and lose weight.  4. Diabetic eye examination, Diabetic eye exam is updated  5. Monitor blood sugar closlely. nutrition counseling.  6. Sign and symptoms of hypoglycemia including shaking sweating,confusion and headaches.  This patient was seen by Leretha Pol FNP Collaboration with Dr Lavera Guise as a part of collaborative care agreement   Orders Placed This Encounter  Procedures  . Ambulatory referral to Podiatry  . POCT HgB A1C      Time spent: 25 Minutes      Dr Lavera Guise Internal medicine

## 2019-07-01 DIAGNOSIS — E119 Type 2 diabetes mellitus without complications: Secondary | ICD-10-CM | POA: Insufficient documentation

## 2019-07-01 DIAGNOSIS — B351 Tinea unguium: Secondary | ICD-10-CM | POA: Insufficient documentation

## 2019-07-01 DIAGNOSIS — E1165 Type 2 diabetes mellitus with hyperglycemia: Secondary | ICD-10-CM | POA: Insufficient documentation

## 2019-08-01 ENCOUNTER — Telehealth: Payer: Self-pay

## 2019-08-01 NOTE — Telephone Encounter (Signed)
Update HM

## 2019-09-10 ENCOUNTER — Other Ambulatory Visit: Payer: Self-pay | Admitting: Nurse Practitioner

## 2019-09-10 DIAGNOSIS — I10 Essential (primary) hypertension: Secondary | ICD-10-CM | POA: Diagnosis not present

## 2019-09-10 DIAGNOSIS — E559 Vitamin D deficiency, unspecified: Secondary | ICD-10-CM | POA: Diagnosis not present

## 2019-09-10 DIAGNOSIS — E1165 Type 2 diabetes mellitus with hyperglycemia: Secondary | ICD-10-CM | POA: Diagnosis not present

## 2019-09-10 DIAGNOSIS — Z0001 Encounter for general adult medical examination with abnormal findings: Secondary | ICD-10-CM | POA: Diagnosis not present

## 2019-09-10 DIAGNOSIS — I499 Cardiac arrhythmia, unspecified: Secondary | ICD-10-CM | POA: Diagnosis not present

## 2019-09-10 DIAGNOSIS — D509 Iron deficiency anemia, unspecified: Secondary | ICD-10-CM | POA: Diagnosis not present

## 2019-09-11 LAB — LIPID PANEL W/O CHOL/HDL RATIO
Cholesterol, Total: 165 mg/dL (ref 100–199)
HDL: 51 mg/dL (ref >39)
LDL Chol Calc (NIH): 91 mg/dL (ref 0–99)
Triglycerides: 130 mg/dL (ref 0–149)
VLDL Cholesterol Cal: 23 mg/dL (ref 5–40)

## 2019-09-11 LAB — COMPREHENSIVE METABOLIC PANEL
ALT: 15 IU/L (ref 0–32)
AST: 17 IU/L (ref 0–40)
Albumin/Globulin Ratio: 1.9 (ref 1.2–2.2)
Albumin: 4.3 g/dL (ref 3.6–4.6)
Alkaline Phosphatase: 66 IU/L (ref 39–117)
BUN/Creatinine Ratio: 17 (ref 12–28)
BUN: 14 mg/dL (ref 8–27)
Bilirubin Total: 0.7 mg/dL (ref 0.0–1.2)
CO2: 22 mmol/L (ref 20–29)
Calcium: 9.1 mg/dL (ref 8.7–10.3)
Chloride: 104 mmol/L (ref 96–106)
Creatinine, Ser: 0.82 mg/dL (ref 0.57–1.00)
GFR calc Af Amer: 76 mL/min/{1.73_m2} (ref >59)
GFR calc non Af Amer: 66 mL/min/{1.73_m2} (ref >59)
Globulin, Total: 2.3 g/dL (ref 1.5–4.5)
Glucose: 128 mg/dL — ABNORMAL HIGH (ref 65–99)
Potassium: 4.2 mmol/L (ref 3.5–5.2)
Sodium: 141 mmol/L (ref 134–144)
Total Protein: 6.6 g/dL (ref 6.0–8.5)

## 2019-09-11 LAB — T4, FREE: Free T4: 1.25 ng/dL (ref 0.82–1.77)

## 2019-09-11 LAB — CBC
Hematocrit: 41.3 % (ref 34.0–46.6)
Hemoglobin: 13.8 g/dL (ref 11.1–15.9)
MCH: 29.7 pg (ref 26.6–33.0)
MCHC: 33.4 g/dL (ref 31.5–35.7)
MCV: 89 fL (ref 79–97)
Platelets: 259 10*3/uL (ref 150–450)
RBC: 4.64 x10E6/uL (ref 3.77–5.28)
RDW: 13.5 % (ref 11.7–15.4)
WBC: 7.6 10*3/uL (ref 3.4–10.8)

## 2019-09-11 LAB — B12 AND FOLATE PANEL
Folate: 12.2 ng/mL (ref >3.0)
Vitamin B-12: 440 pg/mL (ref 232–1245)

## 2019-09-11 LAB — FERRITIN: Ferritin: 58 ng/mL (ref 15–150)

## 2019-09-11 LAB — VITAMIN D 25 HYDROXY (VIT D DEFICIENCY, FRACTURES): Vit D, 25-Hydroxy: 30.1 ng/mL (ref 30.0–100.0)

## 2019-09-11 LAB — TSH: TSH: 2.49 u[IU]/mL (ref 0.450–4.500)

## 2019-09-12 ENCOUNTER — Other Ambulatory Visit: Payer: Self-pay

## 2019-09-12 DIAGNOSIS — E1165 Type 2 diabetes mellitus with hyperglycemia: Secondary | ICD-10-CM

## 2019-09-12 MED ORDER — METFORMIN HCL 850 MG PO TABS: 850.0000 mg | ORAL_TABLET | Freq: Two times a day (BID) | ORAL | 3 refills | Status: DC

## 2019-09-15 NOTE — Progress Notes (Signed)
Labs good. Discuss at visit 09/20/2019

## 2019-09-18 ENCOUNTER — Telehealth: Payer: Self-pay

## 2019-09-18 NOTE — Telephone Encounter (Signed)
CONFIRMED 09-20-19 OV AS VIRTUAL. 

## 2019-09-20 ENCOUNTER — Encounter: Payer: Self-pay | Admitting: Nurse Practitioner

## 2019-09-20 ENCOUNTER — Ambulatory Visit (INDEPENDENT_AMBULATORY_CARE_PROVIDER_SITE_OTHER): Payer: PPO | Admitting: Nurse Practitioner

## 2019-09-20 ENCOUNTER — Other Ambulatory Visit: Payer: Self-pay

## 2019-09-20 VITALS — BP 141/61 | HR 72 | Ht 64.0 in | Wt 144.0 lb

## 2019-09-20 DIAGNOSIS — E1165 Type 2 diabetes mellitus with hyperglycemia: Secondary | ICD-10-CM

## 2019-09-20 DIAGNOSIS — L659 Nonscarring hair loss, unspecified: Secondary | ICD-10-CM

## 2019-09-20 DIAGNOSIS — I10 Essential (primary) hypertension: Secondary | ICD-10-CM | POA: Diagnosis not present

## 2019-09-20 MED ORDER — VALSARTAN 80 MG PO TABS: 80.0000 mg | ORAL_TABLET | Freq: Every day | ORAL | 3 refills | Status: DC

## 2019-09-20 NOTE — Progress Notes (Signed)
[facility] [place], [place] [address]  Internal MEDICINE  Telephone Visit  Patient Name: Vanessa Gentry  [id number]  [id number]  Date of Service: 09/20/2019  I connected with the patient at 10:24am by telephone and verified the patients identity using two identifiers.   I discussed the limitations, risks, security and privacy concerns of performing an evaluation and management service by telephone and the availability of in person appointments. I also discussed with the patient that there may be a patient responsible charge related to the service.  The patient expressed understanding and agrees to proceed.    Chief Complaint  Patient presents with  . Telephone Assessment  . Telephone Screen  . Diabetes    blood sugar 106  . Hypertension  . Alopecia    The patient has been contacted via telephone for follow up visit due to concerns for spread of novel coronavirus. The patient presents for follow up visit. She states that since she has been staying indoors much more due to quarantine/isolation guidelines, her hair has gotten longer. With the increased length, she has also noted it is falling out more readily. She states that she is feeling well. Blood pressure and blood sugar are both well controlled on current medication. She did have routine, fasting labs drawn. All were normal and results were reviewed with her. Her labs were done non-fasting, and lipid panel was well within normal limits.       Current Medication: Outpatient Encounter Medications as of 09/20/2019  Medication Sig  . aspirin EC 81 MG tablet Take 81 mg by mouth daily.  . metFORMIN (GLUCOPHAGE) 850 MG tablet Take 1 tablet (850 mg total) by mouth 2 (two) times daily with a meal.  . valsartan (DIOVAN) 80 MG tablet Take 1 tablet (80 mg total) by mouth daily.  Marland Kitchen VITAMIN D PO Take by mouth.  . [DISCONTINUED] valsartan (DIOVAN) 80 MG tablet Take 1 tablet (80 mg total) by mouth daily.  .  [DISCONTINUED] metFORMIN (GLUCOPHAGE) 850 MG tablet Take 1 tablet (850 mg total) by mouth 2 (two) times daily with a meal.  . [DISCONTINUED] valsartan (DIOVAN) 80 MG tablet Take 1 tablet (80 mg total) by mouth daily.   No facility-administered encounter medications on file as of 09/20/2019.    Surgical History: Past Surgical History:  Procedure Laterality Date  . ABDOMINAL HYSTERECTOMY    . BREAST BIOPSY Left 2011   NEG  . CATARACT EXTRACTION W/PHACO Right 03/13/2015   Procedure: CATARACT EXTRACTION PHACO AND INTRAOCULAR LENS PLACEMENT (IOC);  Surgeon: Leandrew Koyanagi, MD;  Location: [facility] ORS;  Service: Ophthalmology;  Laterality: Right;  [place]  2:40  AP 20.3   CDE 32.41 cassette lot #   [id number]  . CHOLECYSTECTOMY    . ORIF ANKLE FRACTURE    . UPPER GI ENDOSCOPY      Medical History: Past Medical History:  Diagnosis Date  . Arthritis   . Diabetes mellitus without complication ([place])   . Dysrhythmia   . GERD (gastroesophageal reflux disease)   . Hypertension   . Pneumonia     Family History: Family History  Problem Relation Age of Onset  . Breast cancer Neg Hx     Social History   Socioeconomic History  . Marital status: Divorced    Spouse name: Not on file  . Number of children: Not on file  . Years of education: Not on file  . Highest education level: Not on file  Occupational History  .  Not on file  Tobacco Use  . Smoking status: Never Smoker  . Smokeless tobacco: Never Used  Substance and Sexual Activity  . Alcohol use: No  . Drug use: No  . Sexual activity: Not on file  Other Topics Concern  . Not on file  Social History Narrative  . Not on file   Social Determinants of Health   Financial Resource Strain:   . Difficulty of Paying Living Expenses: Not on file  Food Insecurity:   . Worried About Charity fundraiser in the Last Year: Not on file  . Ran Out of Food in the Last Year: Not on file  Transportation Needs:   . Lack of Transportation  (Medical): Not on file  . Lack of Transportation (Non-Medical): Not on file  Physical Activity:   . Days of Exercise per Week: Not on file  . Minutes of Exercise per Session: Not on file  Stress:   . Feeling of Stress : Not on file  Social Connections:   . Frequency of Communication with Friends and Family: Not on file  . Frequency of Social Gatherings with Friends and Family: Not on file  . Attends Religious Services: Not on file  . Active Member of Clubs or Organizations: Not on file  . Attends Archivist Meetings: Not on file  . Marital Status: Not on file  Intimate Partner Violence:   . Fear of Current or Ex-Partner: Not on file  . Emotionally Abused: Not on file  . Physically Abused: Not on file  . Sexually Abused: Not on file      Review of Systems  Constitutional: Negative for activity change, chills, fatigue and unexpected weight change.  HENT: Negative for congestion, postnasal drip, rhinorrhea, sneezing and sore throat.   Respiratory: Negative for cough, chest tightness, shortness of breath and wheezing.   Cardiovascular: Negative for chest pain and palpitations.  Gastrointestinal: Negative for abdominal pain, constipation, diarrhea, nausea and vomiting.  Endocrine: Negative for cold intolerance, heat intolerance, polydipsia and polyuria.       Blood sugars doing well   Musculoskeletal: Negative for arthralgias, back pain, joint swelling and neck pain.  Skin: Negative for rash.       Hair has been falling out more readily.   Allergic/Immunologic: Negative for environmental allergies.  Neurological: Negative for dizziness, tremors, weakness, numbness and headaches.  Hematological: Negative for adenopathy. Does not bruise/bleed easily.  Psychiatric/Behavioral: Negative for behavioral problems (Depression), dysphoric mood, sleep disturbance and suicidal ideas. The patient is not nervous/anxious.     Today's Vitals   09/20/19 0932  BP: (!) 141/61  Pulse: 72   Weight: 144 lb (65.3 kg)  Height: 5\' 4"  (1.626 m)   Body mass index is 24.72 kg/m.  Observation/Objective:   The patient is alert and oriented. She is pleasant and answers all questions appropriately. Breathing is non-labored. She is in no acute distress at this time.    Assessment/Plan:  1. Type 2 diabetes mellitus with hyperglycemia, without long-term current use of insulin (HCC) Blood sugars are stable. Continue medication as prescribed. Check HgbA1c at next, in-office visit   2. Essential hypertension Stable. Continue bp medication as prescribed  - valsartan (DIOVAN) 80 MG tablet; Take 1 tablet (80 mg total) by mouth daily.  Dispense: 30 tablet; Refill: 3  3. Alopecia Normal thyroid and anemia panels. Recommend trial of OTC biotin. Reassess at next, in-office visit.   General Counseling: Keshana verbalizes understanding of the findings of today's phone visit  and agrees with plan of treatment. I have discussed any further diagnostic evaluation that may be needed or ordered today. We also reviewed her medications today. she has been encouraged to call the office with any questions or concerns that should arise related to todays visit.  Diabetes Counseling:  1. Addition of ACE inh/ ARB'S for nephroprotection. Microalbumin is updated  2. Diabetic foot care, prevention of complications. Podiatry consult 3. Exercise and lose weight.  4. Diabetic eye examination, Diabetic eye exam is updated  5. Monitor blood sugar closlely. nutrition counseling.  6. Sign and symptoms of hypoglycemia including shaking sweating,confusion and headaches.  This patient was seen by [place] with Dr Lavera Guise as a part of collaborative care agreement  Meds ordered this encounter  Medications  . valsartan (DIOVAN) 80 MG tablet    Sig: Take 1 tablet (80 mg total) by mouth daily.    Dispense:  30 tablet    Refill:  3    Please d/c irbesartan.    Order Specific Question:    Supervising Provider    Answer:   Lavera Guise [[id number]]    Time spent: 25 Minutes   Time spent with patient included reviewing progress notes, labs, imaging studies, and discussing plan for follow up.   Dr Lavera Guise Internal medicine

## 2019-10-06 ENCOUNTER — Emergency Department: Payer: PPO

## 2019-10-06 ENCOUNTER — Encounter: Payer: Self-pay | Admitting: Intensive Care

## 2019-10-06 ENCOUNTER — Other Ambulatory Visit: Payer: Self-pay

## 2019-10-06 ENCOUNTER — Inpatient Hospital Stay
Admission: EM | Admit: 2019-10-06 | Discharge: 2019-10-12 | DRG: 308 | Disposition: A | Discharge status: Home Health | Payer: PPO | Attending: Internal Medicine | Admitting: Internal Medicine

## 2019-10-06 DIAGNOSIS — K219 Gastro-esophageal reflux disease without esophagitis: Secondary | ICD-10-CM | POA: Diagnosis not present

## 2019-10-06 DIAGNOSIS — E785 Hyperlipidemia, unspecified: Secondary | ICD-10-CM | POA: Diagnosis not present

## 2019-10-06 DIAGNOSIS — I5031 Acute diastolic (congestive) heart failure: Secondary | ICD-10-CM | POA: Diagnosis not present

## 2019-10-06 DIAGNOSIS — J90 Pleural effusion, not elsewhere classified: Secondary | ICD-10-CM | POA: Diagnosis not present

## 2019-10-06 DIAGNOSIS — I700 Atherosclerosis of aorta: Secondary | ICD-10-CM | POA: Diagnosis present

## 2019-10-06 DIAGNOSIS — Z79899 Other long term (current) drug therapy: Secondary | ICD-10-CM

## 2019-10-06 DIAGNOSIS — R0600 Dyspnea, unspecified: Secondary | ICD-10-CM | POA: Diagnosis not present

## 2019-10-06 DIAGNOSIS — Z20822 Contact with and (suspected) exposure to covid-19: Secondary | ICD-10-CM | POA: Diagnosis present

## 2019-10-06 DIAGNOSIS — Z90710 Acquired absence of both cervix and uterus: Secondary | ICD-10-CM

## 2019-10-06 DIAGNOSIS — I428 Other cardiomyopathies: Secondary | ICD-10-CM | POA: Diagnosis not present

## 2019-10-06 DIAGNOSIS — I248 Other forms of acute ischemic heart disease: Secondary | ICD-10-CM | POA: Diagnosis present

## 2019-10-06 DIAGNOSIS — I361 Nonrheumatic tricuspid (valve) insufficiency: Secondary | ICD-10-CM | POA: Diagnosis not present

## 2019-10-06 DIAGNOSIS — I340 Nonrheumatic mitral (valve) insufficiency: Secondary | ICD-10-CM | POA: Diagnosis not present

## 2019-10-06 DIAGNOSIS — I420 Dilated cardiomyopathy: Secondary | ICD-10-CM | POA: Diagnosis not present

## 2019-10-06 DIAGNOSIS — Z45018 Encounter for adjustment and management of other part of cardiac pacemaker: Secondary | ICD-10-CM | POA: Diagnosis not present

## 2019-10-06 DIAGNOSIS — R05 Cough: Secondary | ICD-10-CM | POA: Diagnosis not present

## 2019-10-06 DIAGNOSIS — I110 Hypertensive heart disease with heart failure: Secondary | ICD-10-CM | POA: Diagnosis present

## 2019-10-06 DIAGNOSIS — Z7982 Long term (current) use of aspirin: Secondary | ICD-10-CM

## 2019-10-06 DIAGNOSIS — R0603 Acute respiratory distress: Secondary | ICD-10-CM | POA: Diagnosis not present

## 2019-10-06 DIAGNOSIS — I10 Essential (primary) hypertension: Secondary | ICD-10-CM | POA: Diagnosis present

## 2019-10-06 DIAGNOSIS — I4891 Unspecified atrial fibrillation: Principal | ICD-10-CM

## 2019-10-06 DIAGNOSIS — J810 Acute pulmonary edema: Secondary | ICD-10-CM | POA: Diagnosis not present

## 2019-10-06 DIAGNOSIS — I4892 Unspecified atrial flutter: Secondary | ICD-10-CM | POA: Diagnosis not present

## 2019-10-06 DIAGNOSIS — E1165 Type 2 diabetes mellitus with hyperglycemia: Secondary | ICD-10-CM | POA: Diagnosis not present

## 2019-10-06 DIAGNOSIS — Z7984 Long term (current) use of oral hypoglycemic drugs: Secondary | ICD-10-CM | POA: Diagnosis not present

## 2019-10-06 DIAGNOSIS — R0602 Shortness of breath: Secondary | ICD-10-CM | POA: Diagnosis not present

## 2019-10-06 DIAGNOSIS — R9389 Abnormal findings on diagnostic imaging of other specified body structures: Secondary | ICD-10-CM

## 2019-10-06 DIAGNOSIS — I429 Cardiomyopathy, unspecified: Secondary | ICD-10-CM | POA: Diagnosis not present

## 2019-10-06 DIAGNOSIS — R0789 Other chest pain: Secondary | ICD-10-CM | POA: Diagnosis not present

## 2019-10-06 LAB — COMPREHENSIVE METABOLIC PANEL
ALT: 45 U/L — ABNORMAL HIGH (ref 0–44)
AST: 46 U/L — ABNORMAL HIGH (ref 15–41)
Albumin: 3.7 g/dL (ref 3.5–5.0)
Alkaline Phosphatase: 74 U/L (ref 38–126)
Anion gap: 12 (ref 5–15)
BUN: 20 mg/dL (ref 8–23)
CO2: 21 mmol/L — ABNORMAL LOW (ref 22–32)
Calcium: 8.7 mg/dL — ABNORMAL LOW (ref 8.9–10.3)
Chloride: 105 mmol/L (ref 98–111)
Creatinine, Ser: 0.89 mg/dL (ref 0.44–1.00)
GFR calc Af Amer: >60 mL/min (ref >60)
GFR calc non Af Amer: 60 mL/min — ABNORMAL LOW (ref >60)
Glucose, Bld: 170 mg/dL — ABNORMAL HIGH (ref 70–99)
Potassium: 4.5 mmol/L (ref 3.5–5.1)
Sodium: 138 mmol/L (ref 135–145)
Total Bilirubin: 1.2 mg/dL (ref 0.3–1.2)
Total Protein: 7.3 g/dL (ref 6.5–8.1)

## 2019-10-06 LAB — CBC WITH DIFFERENTIAL/PLATELET
Abs Immature Granulocytes: 0.05 10*3/uL (ref 0.00–0.07)
Basophils Absolute: 0.1 10*3/uL (ref 0.0–0.1)
Basophils Relative: 1 %
Eosinophils Absolute: 0.1 10*3/uL (ref 0.0–0.5)
Eosinophils Relative: 1 %
HCT: 39.5 % (ref 36.0–46.0)
Hemoglobin: 12.7 g/dL (ref 12.0–15.0)
Immature Granulocytes: 1 %
Lymphocytes Relative: 14 %
Lymphs Abs: 1.2 10*3/uL (ref 0.7–4.0)
MCH: 28.9 pg (ref 26.0–34.0)
MCHC: 32.2 g/dL (ref 30.0–36.0)
MCV: 89.8 fL (ref 80.0–100.0)
Monocytes Absolute: 0.7 10*3/uL (ref 0.1–1.0)
Monocytes Relative: 8 %
Neutro Abs: 6.6 10*3/uL (ref 1.7–7.7)
Neutrophils Relative %: 75 %
Platelets: 303 10*3/uL (ref 150–400)
RBC: 4.4 MIL/uL (ref 3.87–5.11)
RDW: 14.6 % (ref 11.5–15.5)
WBC: 8.6 10*3/uL (ref 4.0–10.5)
nRBC: 0 % (ref 0.0–0.2)

## 2019-10-06 LAB — BRAIN NATRIURETIC PEPTIDE: B Natriuretic Peptide: 240 pg/mL — ABNORMAL HIGH (ref 0.0–100.0)

## 2019-10-06 LAB — TROPONIN I (HIGH SENSITIVITY)
Troponin I (High Sensitivity): 41 ng/L — ABNORMAL HIGH (ref <18)
Troponin I (High Sensitivity): 38 ng/L — ABNORMAL HIGH (ref <18)

## 2019-10-06 LAB — RESPIRATORY PANEL BY RT PCR (FLU A&B, COVID)
Influenza A by PCR: NEGATIVE
Influenza B by PCR: NEGATIVE
SARS Coronavirus 2 by RT PCR: NEGATIVE

## 2019-10-06 LAB — GLUCOSE, CAPILLARY: Glucose-Capillary: 188 mg/dL — ABNORMAL HIGH (ref 70–99)

## 2019-10-06 MED ORDER — FUROSEMIDE 10 MG/ML IJ SOLN
40.0000 mg | Freq: Four times a day (QID) | INTRAMUSCULAR | Status: AC
  Administered 2019-10-06 – 2019-10-07 (×3): 40 mg via INTRAVENOUS
  Filled 2019-10-06 (×3): qty 4

## 2019-10-06 MED ORDER — METOPROLOL TARTRATE 25 MG PO TABS
12.5000 mg | ORAL_TABLET | Freq: Two times a day (BID) | ORAL | Status: DC
  Administered 2019-10-06 – 2019-10-07 (×2): 12.5 mg via ORAL
  Filled 2019-10-06 (×2): qty 1

## 2019-10-06 MED ORDER — ACETAMINOPHEN 325 MG PO TABS: 650.0000 mg | ORAL_TABLET | ORAL | Status: DC | PRN

## 2019-10-06 MED ORDER — SODIUM CHLORIDE 0.45 % IV SOLN: INTRAVENOUS | Status: DC

## 2019-10-06 MED ORDER — ORAL CARE MOUTH RINSE
15.0000 mL | Freq: Two times a day (BID) | OROMUCOSAL | Status: DC
  Administered 2019-10-06 – 2019-10-12 (×10): 15 mL via OROMUCOSAL

## 2019-10-06 MED ORDER — DILTIAZEM HCL 25 MG/5ML IV SOLN
10.0000 mg | Freq: Once | INTRAVENOUS | Status: AC
  Administered 2019-10-06: 10 mg via INTRAVENOUS
  Filled 2019-10-06: qty 5

## 2019-10-06 MED ORDER — IRBESARTAN 150 MG PO TABS
75.0000 mg | ORAL_TABLET | Freq: Every day | ORAL | Status: DC
  Filled 2019-10-06 (×2): qty 1

## 2019-10-06 MED ORDER — SODIUM CHLORIDE 0.9 % IV BOLUS
1000.0000 mL | Freq: Once | INTRAVENOUS | Status: AC
  Administered 2019-10-06: 1000 mL via INTRAVENOUS

## 2019-10-06 MED ORDER — ASPIRIN EC 81 MG PO TBEC
81.0000 mg | DELAYED_RELEASE_TABLET | Freq: Every day | ORAL | Status: DC
  Administered 2019-10-07: 81 mg via ORAL
  Filled 2019-10-06: qty 1

## 2019-10-06 MED ORDER — APIXABAN 5 MG PO TABS
5.0000 mg | ORAL_TABLET | Freq: Two times a day (BID) | ORAL | Status: DC
  Administered 2019-10-06 – 2019-10-12 (×12): 5 mg via ORAL
  Filled 2019-10-06 (×12): qty 1

## 2019-10-06 MED ORDER — INSULIN ASPART 100 UNIT/ML ~~LOC~~ SOLN
0.0000 [IU] | Freq: Three times a day (TID) | SUBCUTANEOUS | Status: DC
  Administered 2019-10-07: 2 [IU] via SUBCUTANEOUS
  Administered 2019-10-07: 3 [IU] via SUBCUTANEOUS
  Administered 2019-10-08 (×2): 2 [IU] via SUBCUTANEOUS
  Administered 2019-10-08: 3 [IU] via SUBCUTANEOUS
  Administered 2019-10-09: 2 [IU] via SUBCUTANEOUS
  Administered 2019-10-09: 3 [IU] via SUBCUTANEOUS
  Administered 2019-10-10: 2 [IU] via SUBCUTANEOUS
  Administered 2019-10-10 (×2): 3 [IU] via SUBCUTANEOUS
  Administered 2019-10-11 – 2019-10-12 (×4): 2 [IU] via SUBCUTANEOUS
  Filled 2019-10-06 (×14): qty 1

## 2019-10-06 MED ORDER — DILTIAZEM HCL-DEXTROSE 125-5 MG/125ML-% IV SOLN (PREMIX)
5.0000 mg/h | INTRAVENOUS | Status: DC
  Administered 2019-10-07: 10 mg/h via INTRAVENOUS
  Administered 2019-10-07: 15 mg/h via INTRAVENOUS
  Filled 2019-10-06 (×2): qty 125

## 2019-10-06 MED ORDER — ONDANSETRON HCL 4 MG/2ML IJ SOLN: 4.0000 mg | Freq: Four times a day (QID) | INTRAMUSCULAR | Status: DC | PRN

## 2019-10-06 MED ORDER — DILTIAZEM HCL-DEXTROSE 125-5 MG/125ML-% IV SOLN (PREMIX)
5.0000 mg/h | INTRAVENOUS | Status: DC
  Administered 2019-10-06: 5 mg/h via INTRAVENOUS
  Filled 2019-10-06: qty 125

## 2019-10-06 NOTE — ED Notes (Signed)
Received verbal orders from admitting provider for O2 at 2L via nasal cannula for comfort.

## 2019-10-06 NOTE — ED Notes (Signed)
ED TO INPATIENT HANDOFF REPORT  ED Nurse Name and Phone #:  Gershon Mussel RN   [phone]  S Name/Age/Gender Vanessa Gentry 86 y.o. female Room/Bed: ED04A/ED04A  Code Status   Code Status: Full Code  Home/SNF/Other Home Patient oriented to: self, place, time and situation Is this baseline? Yes   Triage Complete: Triage complete  Chief Complaint Atrial fibrillation with RVR ([place]) [I48.91]  Triage Note C/o sob and cough for a few days    Allergies Allergies  Allergen Reactions  . Sulfa Antibiotics Other (See Comments)  . Iodinated Diagnostic Agents Rash    Betadine ok Betadine ok    Level of Care/Admitting Diagnosis ED Disposition    ED Disposition Condition [facility] Area: [place] [[id number]]  Level of Care: Telemetry [5]  Covid Evaluation: Asymptomatic Screening Protocol (No Symptoms)  Diagnosis: Atrial fibrillation with RVR [facility] [id number]  Admitting Physician: Neena Rhymes [[id number]]  Attending Physician: Adella Hare E [[id number]]  Estimated length of stay: 3 - 4 days  Certification:: I certify this patient will need inpatient services for at least 2 midnights       B Medical/Surgery History Past Medical History:  Diagnosis Date  . Arthritis   . Diabetes mellitus without complication ([place])   . Dysrhythmia   . GERD (gastroesophageal reflux disease)   . Hypertension   . Pneumonia    Past Surgical History:  Procedure Laterality Date  . ABDOMINAL HYSTERECTOMY    . BREAST BIOPSY Left 2011   NEG  . CATARACT EXTRACTION W/PHACO Right 03/13/2015   Procedure: CATARACT EXTRACTION PHACO AND INTRAOCULAR LENS PLACEMENT (IOC);  Surgeon: Leandrew Koyanagi, MD;  Location: [facility] ORS;  Service: Ophthalmology;  Laterality: Right;  [place]  2:40  AP 20.3   CDE 32.41 cassette lot #   [id number]  . CHOLECYSTECTOMY    . ORIF ANKLE FRACTURE    . UPPER GI ENDOSCOPY       A IV Location/Drains/Wounds Patient Lines/Drains/Airways Status   Active  Line/Drains/Airways    Name:   Placement date:   Placement time:   Site:   Days:   Peripheral IV 10/06/19 Left Antecubital   10/06/19    1648    Antecubital   less than 1   Peripheral IV 10/06/19 Right Antecubital   10/06/19    1821    Antecubital   less than 1   Incision (Closed) 03/13/15 Eye Right   03/13/15    0844     1668          Intake/Output Last 24 hours  Intake/Output Summary (Last 24 hours) at 10/06/2019 1935 Last data filed at 10/06/2019 1754 Gross per 24 hour  Intake 1000 ml  Output --  Net 1000 ml    Labs/Imaging Results for orders placed or performed during the hospital encounter of 10/06/19 (from the past 48 hour(s))  CBC with Differential     Status: None   Collection Time: 10/06/19  4:04 PM  Result Value Ref Range   WBC 8.6 4.0 - 10.5 K/uL   RBC 4.40 3.87 - 5.11 MIL/uL   Hemoglobin 12.7 12.0 - 15.0 g/dL   HCT 39.5 36.0 - 46.0 %   MCV 89.8 80.0 - 100.0 fL   MCH 28.9 26.0 - 34.0 pg   MCHC 32.2 30.0 - 36.0 g/dL   RDW 14.6 11.5 - 15.5 %   Platelets 303 150 - 400 K/uL   nRBC 0.0 0.0 - 0.2 %  Neutrophils Relative % 75 %   Neutro Abs 6.6 1.7 - 7.7 K/uL   Lymphocytes Relative 14 %   Lymphs Abs 1.2 0.7 - 4.0 K/uL   Monocytes Relative 8 %   Monocytes Absolute 0.7 0.1 - 1.0 K/uL   Eosinophils Relative 1 %   Eosinophils Absolute 0.1 0.0 - 0.5 K/uL   Basophils Relative 1 %   Basophils Absolute 0.1 0.0 - 0.1 K/uL   Immature Granulocytes 1 %   Abs Immature Granulocytes 0.05 0.00 - 0.07 K/uL    Comment: Performed at [facility], [address] [place], [place] [address]  Comprehensive metabolic panel     Status: Abnormal   Collection Time: 10/06/19  4:04 PM  Result Value Ref Range   Sodium 138 135 - 145 mmol/L   Potassium 4.5 3.5 - 5.1 mmol/L   Chloride 105 98 - 111 mmol/L   CO2 21 (L) 22 - 32 mmol/L   Glucose, Bld 170 (H) 70 - 99 mg/dL   BUN 20 8 - 23 mg/dL   Creatinine, Ser 0.89 0.44 - 1.00 mg/dL   Calcium 8.7 (L) 8.9 - 10.3 mg/dL   Total  Protein 7.3 6.5 - 8.1 g/dL   Albumin 3.7 3.5 - 5.0 g/dL   AST 46 (H) 15 - 41 U/L   ALT 45 (H) 0 - 44 U/L   Alkaline Phosphatase 74 38 - 126 U/L   Total Bilirubin 1.2 0.3 - 1.2 mg/dL   GFR calc non Af Amer 60 (L) >60 mL/min   GFR calc Af Amer >60 >60 mL/min   Anion gap 12 5 - 15    Comment: Performed at [facility], [address] [place], [place] [address]  Troponin I (High Sensitivity)     Status: Abnormal   Collection Time: 10/06/19  4:04 PM  Result Value Ref Range   Troponin I (High Sensitivity) 41 (H) <18 ng/L    Comment: (NOTE) Elevated high sensitivity troponin I (hsTnI) values and significant  changes across serial measurements may suggest ACS but many other  chronic and acute conditions are known to elevate hsTnI results.  Refer to the "Links" section for chest pain algorithms and additional  guidance. Performed at [facility], [address] [place], [place] [address]   Respiratory Panel by RT PCR (Flu A&B, Covid) - Nasopharyngeal Swab     Status: None   Collection Time: 10/06/19  6:12 PM   Specimen: Nasopharyngeal Swab  Result Value Ref Range   SARS Coronavirus 2 by RT PCR NEGATIVE NEGATIVE    Comment: (NOTE) SARS-CoV-2 target nucleic acids are NOT DETECTED. The SARS-CoV-2 RNA is generally detectable in upper respiratoy specimens during the acute phase of infection. The lowest concentration of SARS-CoV-2 viral copies this assay can detect is 131 copies/mL. A negative result does not preclude SARS-Cov-2 infection and should not be used as the sole basis for treatment or other patient management decisions. A negative result may occur with  improper specimen collection/handling, submission of specimen other than nasopharyngeal swab, presence of viral mutation(s) within the areas targeted by this assay, and inadequate number of viral copies (<131 copies/mL). A negative result must be combined with clinical observations, patient history, and  epidemiological information. The expected result is Negative. Fact Sheet for Patients:  PinkCheek.be Fact Sheet for Healthcare Providers:  GravelBags.it This test is not yet ap proved or cleared by the [place] FDA and  has been authorized for detection and/or diagnosis of SARS-CoV-2 by  FDA under an Emergency Use Authorization (EUA). This EUA will remain  in effect (meaning this test can be used) for the duration of the COVID-19 declaration under Section 564(b)(1) of the Act, 21 U.S.C. section 360bbb-3(b)(1), unless the authorization is terminated or revoked sooner.    Influenza A by PCR NEGATIVE NEGATIVE   Influenza B by PCR NEGATIVE NEGATIVE    Comment: (NOTE) The Xpert Xpress SARS-CoV-2/FLU/RSV assay is intended as an aid in  the diagnosis of influenza from Nasopharyngeal swab specimens and  should not be used as a sole basis for treatment. Nasal washings and  aspirates are unacceptable for Xpert Xpress SARS-CoV-2/FLU/RSV  testing. Fact Sheet for Patients: PinkCheek.be Fact Sheet for Healthcare Providers: GravelBags.it This test is not yet approved or cleared by the [place] FDA and  has been authorized for detection and/or diagnosis of SARS-CoV-2 by  FDA under an Emergency Use Authorization (EUA). This EUA will remain  in effect (meaning this test can be used) for the duration of the  Covid-19 declaration under Section 564(b)(1) of the Act, 21  U.S.C. section 360bbb-3(b)(1), unless the authorization is  terminated or revoked. Performed at [facility], [address] [place], [place] [address]    DG Chest 2 View  Result Date: 10/06/2019 CLINICAL DATA:  Cough and shortness of breath for several days. EXAM: CHEST - 2 VIEW COMPARISON:  04/22/2009 FINDINGS: Heart size is normal. Azygos fissure again noted. New multifocal ill-defined areas of  airspace opacity are seen in both lungs, right side greater than left. Small bilateral pleural effusions are also noted. IMPRESSION: New multifocal bilateral airspace opacity, right side greater than left, and small bilateral pleural effusions. Electronically Signed   By: Marlaine Hind M.D.   On: 10/06/2019 16:55    Pending Labs Unresulted Labs (From admission, onward)    Start     Ordered   10/07/19 [id number]  Basic metabolic panel  Tomorrow morning,   STAT     10/06/19 1920   10/07/19 0500  Lipid panel  Tomorrow morning,   STAT     10/06/19 1920   10/06/19 1924  Brain natriuretic peptide  Add-on,   AD     10/06/19 1925          Vitals/Pain Today's Vitals   10/06/19 1700 10/06/19 1809 10/06/19 1830 10/06/19 1848  BP: 107/75 (!) 153/99 (!) 144/90 (!) 143/95  Pulse:  (!) 163  (!) 102  Resp: (!) 27 (!) 26 (!) 27 (!) 25  Temp:      TempSrc:      SpO2: 95% 99% 97% 95%  Weight:      Height:      PainSc:        Isolation Precautions Airborne and Contact precautions  Medications Medications  aspirin EC tablet 81 mg (has no administration in time range)  irbesartan (AVAPRO) tablet 75 mg (has no administration in time range)  acetaminophen (TYLENOL) tablet 650 mg (has no administration in time range)  ondansetron (ZOFRAN) injection 4 mg (has no administration in time range)  insulin aspart (novoLOG) injection 0-15 Units (has no administration in time range)  0.45 % sodium chloride infusion (has no administration in time range)  apixaban (ELIQUIS) tablet 2.5 mg (has no administration in time range)  diltiazem (CARDIZEM) 125 mg in dextrose 5% 125 mL (1 mg/mL) infusion (has no administration in time range)  metoprolol tartrate (LOPRESSOR) tablet 12.5 mg (has no administration in time range)  sodium chloride 0.9 % bolus 1,000  mL (0 mLs Intravenous Stopped 10/06/19 1754)  diltiazem (CARDIZEM) injection 10 mg (10 mg Intravenous Given 10/06/19 1656)    Mobility walks with device Low fall  risk   Focused Assessments Cardiac Assessment Handoff:  Cardiac Rhythm: Atrial fibrillation No results found for: CKTOTAL, CKMB, CKMBINDEX, TROPONINI No results found for: DDIMER Does the Patient currently have chest pain? No      R Recommendations: See Admitting Provider Note  Report given to:   Additional Notes:

## 2019-10-06 NOTE — ED Notes (Signed)
Pt ambulatory to toilet

## 2019-10-06 NOTE — ED Triage Notes (Signed)
C/o sob and cough for a few days

## 2019-10-06 NOTE — H&P (Signed)
History and Physical    Vanessa Gentry [id number] DOB: Oct 03, 1932 DOA: 10/06/2019  PCP: Ronnell Freshwater, NP (Confirm with patient/family/NH records and if not entered, this has to be entered at [facility] point of entry) Patient coming from: coming from home  I have personally briefly reviewed patient's old medical records in [place]  Chief Complaint: SOB  HPI: Vanessa Gentry is a 86 y.o. female with medical history significant of arthritis, diabetes, gastric reflux, hypertension, presents to the emergency department for shortness of breath.  According to the patient for the past 1 week or so she has intermittently been feeling short of breath, worse with exertion.  States she has had a dry cough as well.  Denies any sputum production.  Denies any fever.  Patient states chest tightness at times but denies any pain. (For level 3, the HPI must include 4+ descriptors: Location, Quality, Severity, Duration, Timing, Context, modifying factors, associated signs/symptoms and/or status of 3+ chronic problems.)  (Please avoid self-populating past medical history here) (The initial 2-3 lines should be focused and good to copy and paste in the HPI section of the daily progress note).  ED Course: In ED patient found to be in atrial fibrillation with RVR. BP was stable. Routine labs unremarkable. Initial Troponin 41. CXR with patchy opacities R>L with small bilateral effusions. Patient given Diltiazem bolus and started on diltiazem drip with heart coming down to 160s. Patient referred to [facility] for admission and continued management  Review of Systems: As per HPI otherwise 10 point review of systems negative. Very mild chest discomfort. Unacceptable ROS statements: "10 systems reviewed," "Extensive" (without elaboration).  Acceptable ROS statements: "All others negative," "All others reviewed and are negative," and "All others unremarkable," with at [place] documented Can't double dip - if  using for HPI can't use for ROS  Past Medical History:  Diagnosis Date  . Arthritis   . Diabetes mellitus without complication ([place])   . Dysrhythmia   . GERD (gastroesophageal reflux disease)   . Hypertension   . Pneumonia     Past Surgical History:  Procedure Laterality Date  . ABDOMINAL HYSTERECTOMY    . BREAST BIOPSY Left 2011   NEG  . CATARACT EXTRACTION W/PHACO Right 03/13/2015   Procedure: CATARACT EXTRACTION PHACO AND INTRAOCULAR LENS PLACEMENT (IOC);  Surgeon: Leandrew Koyanagi, MD;  Location: [facility] ORS;  Service: Ophthalmology;  Laterality: Right;  [place]  2:40  AP 20.3   CDE 32.41 cassette lot #   [id number]  . CHOLECYSTECTOMY    . ORIF ANKLE FRACTURE    . UPPER GI ENDOSCOPY     Soc Hx  - married about 25 years then divorced. Never remarried. One son, 1 daughter, 3 grands, 1 great. 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 Lives alone. I-ADLs.   reports that she has never smoked. She has never used smokeless tobacco. She reports that she does not drink alcohol or use drugs.  Allergies  Allergen Reactions  . Sulfa Antibiotics Other (See Comments)  . Iodinated Diagnostic Agents Rash    Betadine ok Betadine ok    Family History  Problem Relation Age of Onset  . Breast cancer Neg Hx    Unacceptable: Noncontributory, unremarkable, or negative. Acceptable: Family history reviewed and not pertinent (If you reviewed it)  Prior to Admission medications   Medication Sig Start Date End Date Taking? Authorizing Provider  aspirin EC 81 MG tablet Take 81 mg by  mouth daily.   Yes [provider]  Biotin 10 MG TABS Take 10 mg by mouth daily.   Yes [provider]  metFORMIN (GLUCOPHAGE) 850 MG tablet Take 1 tablet (850 mg total) by mouth 2 (two) times daily with a meal. 09/12/19  Yes Lavera Guise, MD  valsartan (DIOVAN) 80 MG tablet Take 1 tablet (80 mg total) by mouth daily. 09/20/19  Yes Boscia, Greer Ee, NP  VITAMIN D PO Take  1,000 mg by mouth daily.    Yes [provider]    Physical Exam: Vitals:   10/06/19 1700 10/06/19 1809 10/06/19 1830 10/06/19 1848  BP: 107/75 (!) 153/99 (!) 144/90 (!) 143/95  Pulse:  (!) 163  (!) 102  Resp: (!) 27 (!) 26 (!) 27 (!) 25  Temp:      TempSrc:      SpO2: 95% 99% 97% 95%  Weight:      Height:        Constitutional: NAD, calm, comfortable Vitals:   10/06/19 1700 10/06/19 1809 10/06/19 1830 10/06/19 1848  BP: 107/75 (!) 153/99 (!) 144/90 (!) 143/95  Pulse:  (!) 163  (!) 102  Resp: (!) 27 (!) 26 (!) 27 (!) 25  Temp:      TempSrc:      SpO2: 95% 99% 97% 95%  Weight:      Height:       General  WNWD woman looking younger than stated age in mild respiratory distress Eyes: PERRL, lids and conjunctivae normal ENMT: Mucous membranes are moist. Posterior pharynx clear of any exudate or lesions.Normal dentition.  Neck: normal, supple, no masses, no thyromegaly Respiratory: Increased WOB using abdominal musculature. Decreased BS right base, diffuse crackles bilaterally, mild expiratory wheeze.  Cardiovascular: Rapid IRIR rate, no murmurs / rubs / gallops. No extremity edema. 2+ pedal pulses. No carotid bruits.  Abdomen: no tenderness, no masses palpated. No hepatosplenomegaly. Bowel sounds hypoactive.  Musculoskeletal: no clubbing / cyanosis. No joint deformity upper and lower extremities. Good ROM, no contractures. Normal muscle tone.  Skin: no rashes, lesions, ulcers. No induration Neurologic: CN 2-12 grossly intact. Sensation intact, DTR normal. Strength 5/5 in all 4.  Psychiatric: Normal judgment and insight. Alert and oriented x 3. Normal mood.   (Anything < 9 systems with 2 bullets each down codes to level 1) (If patient refuses exam can't bill higher level) (Make sure to document decubitus ulcers present on admission -- if possible -- and whether patient has chronic indwelling catheter at time of admission)  Labs on Admission: I have personally reviewed  following labs and imaging studies  CBC: Recent Labs  Lab 10/06/19 1604  WBC 8.6  NEUTROABS 6.6  HGB 12.7  HCT 39.5  MCV 89.8  PLT [id number]   Basic Metabolic Panel: Recent Labs  Lab 10/06/19 1604  NA 138  K 4.5  CL 105  CO2 21*  GLUCOSE 170*  BUN 20  CREATININE 0.89  CALCIUM 8.7*   GFR: Estimated Creatinine Clearance: 40.6 mL/min (by C-G formula based on SCr of 0.89 mg/dL). Liver Function Tests: Recent Labs  Lab 10/06/19 1604  AST 46*  ALT 45*  ALKPHOS 74  BILITOT 1.2  PROT 7.3  ALBUMIN 3.7   No results for input(s): LIPASE, AMYLASE in the last 168 hours. No results for input(s): AMMONIA in the last 168 hours. Coagulation Profile: No results for input(s): INR, PROTIME in the last 168 hours. Cardiac Enzymes: No results for input(s): CKTOTAL, CKMB, CKMBINDEX, TROPONINI in  the last 168 hours. BNP (last 3 results) No results for input(s): PROBNP in the last 8760 hours. HbA1C: No results for input(s): HGBA1C in the last 72 hours. CBG: No results for input(s): GLUCAP in the last 168 hours. Lipid Profile: No results for input(s): CHOL, HDL, LDLCALC, TRIG, CHOLHDL, LDLDIRECT in the last 72 hours. Thyroid Function Tests: No results for input(s): TSH, T4TOTAL, FREET4, T3FREE, THYROIDAB in the last 72 hours. Anemia Panel: No results for input(s): VITAMINB12, FOLATE, FERRITIN, TIBC, IRON, RETICCTPCT in the last 72 hours. Urine analysis:    Component Value Date/Time   APPEARANCEUR Clear 03/15/2019 0230   GLUCOSEU Negative 03/15/2019 0230   BILIRUBINUR Negative 03/15/2019 0230   PROTEINUR Negative 03/15/2019 0230   NITRITE Negative 03/15/2019 0230   LEUKOCYTESUR Negative 03/15/2019 0230    Radiological Exams on Admission: DG Chest 2 View  Result Date: 10/06/2019 CLINICAL DATA:  Cough and shortness of breath for several days. EXAM: CHEST - 2 VIEW COMPARISON:  04/22/2009 FINDINGS: Heart size is normal. Azygos fissure again noted. New multifocal ill-defined areas of  airspace opacity are seen in both lungs, right side greater than left. Small bilateral pleural effusions are also noted. IMPRESSION: New multifocal bilateral airspace opacity, right side greater than left, and small bilateral pleural effusions. Electronically Signed   By: Marlaine Hind M.D.   On: 10/06/2019 16:55    EKG: Independently reviewed. A. Fib with RVR, question of anterior injury but no acute ischemia  Assessment/Plan Active Problems:   Atrial fibrillation with RVR (HCC)   Uncontrolled type 2 diabetes mellitus with hyperglycemia ([place])   Essential hypertension  (please populate well all problems here in Problem List. (For example, if patient is on BP meds at home and you resume or decide to hold them, it is a problem that needs to be her. Same for CAD, COPD, HLD and so on)   1. Cardiovascular - A Fib with RVR with some slowing with cardiazem drip. Suspect acute CHF 2/2 rapid rate with decreased cardiac output. Initial troponin 41 Plan Tele admit  Continue cardiazem drip for rate control  Lopressor 12.5 bid ordered  Lasix 40 mg IV q6 x 3 doses for pulmonary edema  Oxygen at 2L [place] 2/2 increased WOB  Cycle Troponin  Chads-Vasc score 5: eliquis 1.5 mg bid ordered  Cardiology consult  2. DM - last A1C 06/19/19 6.2% Plan Hold metformin  SS coverage  3. HTN - continue ARB - renal function is normal   DVT prophylaxis: Eliquis (Lovenox/Heparin/SCD's/anticoagulated/None (if comfort care) Code Status: full code (Full/Partial (specify details) Family Communication: spoke with son explaining diagnosis and tx plan. Answered his questions (Specify name, relationship. Do not write "discussed with patient". Specify tel # if discussed over the phone) Disposition Plan: home when medically stable (specify when and where you expect patient to be discharged) Consults called: Cardiology - left message with on-call [facility] Dr. Garen Lah (with names) Admission status: inpatient (inpatient /  obs / tele / medical floor / SDU)   Adella Hare MD [facility] Pager [phone]  If 7PM-7AM, please contact night-coverage [email or web address] Password [facility]  10/06/2019, 7:26 PM

## 2019-10-06 NOTE — ED Notes (Signed)
First call report, Merry Proud reports primary in other room please hold

## 2019-10-06 NOTE — ED Provider Notes (Signed)
[facility] Emergency Department Provider Note  Time seen: 4:48 PM  I have reviewed the triage vital signs and the nursing notes.   HISTORY  Chief Complaint Shortness of Breath and Cough   HPI Vanessa Gentry is a 86 y.o. female with a past medical history of arthritis, diabetes, gastric reflux, hypertension, presents to the emergency department for shortness of breath.  According to the patient for the past 1 week or so she has intermittently been feeling short of breath, worse with exertion.  States she has had a dry cough as well.  Denies any sputum production.  Denies any fever.  Patient states chest tightness at times but denies any pain.   Past Medical History:  Diagnosis Date  . Arthritis   . Diabetes mellitus without complication ([place])   . Dysrhythmia   . GERD (gastroesophageal reflux disease)   . Hypertension   . Pneumonia     Patient Active Problem List   Diagnosis Date Noted  . Alopecia 09/20/2019  . Type 2 diabetes mellitus with hyperglycemia, without long-term current use of insulin ([place]) 07/01/2019  . Tinea unguium 07/01/2019  . Cellulitis and abscess of hand 01/11/2019  . Essential hypertension 06/04/2018  . Uncontrolled type 2 diabetes mellitus with hyperglycemia ([place]) 12/04/2017  . Dysuria 12/04/2017  . Need for vaccination against Streptococcus pneumoniae 12/04/2017  . Midline low back pain without sciatica 12/04/2017  . Encounter for general adult medical examination with abnormal findings 12/04/2017    Past Surgical History:  Procedure Laterality Date  . ABDOMINAL HYSTERECTOMY    . BREAST BIOPSY Left 2011   NEG  . CATARACT EXTRACTION W/PHACO Right 03/13/2015   Procedure: CATARACT EXTRACTION PHACO AND INTRAOCULAR LENS PLACEMENT (IOC);  Surgeon: Leandrew Koyanagi, MD;  Location: [facility] ORS;  Service: Ophthalmology;  Laterality: Right;  [place]  2:40  AP 20.3   CDE 32.41 cassette lot #   [id number]  . CHOLECYSTECTOMY    . ORIF ANKLE  FRACTURE    . UPPER GI ENDOSCOPY      Prior to Admission medications   Medication Sig Start Date End Date Taking? Authorizing Provider  aspirin EC 81 MG tablet Take 81 mg by mouth daily.    [provider]  metFORMIN (GLUCOPHAGE) 850 MG tablet Take 1 tablet (850 mg total) by mouth 2 (two) times daily with a meal. 09/12/19   Lavera Guise, MD  valsartan (DIOVAN) 80 MG tablet Take 1 tablet (80 mg total) by mouth daily. 09/20/19   Ronnell Freshwater, NP  VITAMIN D PO Take by mouth.    [provider]    Allergies  Allergen Reactions  . Sulfa Antibiotics Other (See Comments)  . Iodinated Diagnostic Agents Rash    Betadine ok Betadine ok    Family History  Problem Relation Age of Onset  . Breast cancer Neg Hx     Social History Social History   Tobacco Use  . Smoking status: Never Smoker  . Smokeless tobacco: Never Used  Substance Use Topics  . Alcohol use: No  . Drug use: No    Review of Systems Constitutional: Negative for fever. Cardiovascular: Intermittent chest tightness Respiratory: Mild shortness of breath worse with exertion Gastrointestinal: Negative for abdominal pain Musculoskeletal: Negative for musculoskeletal complaints Neurological: Negative for headache All other ROS negative  ____________________________________________   PHYSICAL EXAM:  VITAL SIGNS: ED Triage Vitals  Enc Vitals Group     BP 10/06/19 1552 (!) 139/99     Pulse  Rate 10/06/19 1552 85     Resp 10/06/19 1552 15     Temp 10/06/19 1552 98.2 F (36.8 C)     Temp Source 10/06/19 1552 Oral     SpO2 10/06/19 1552 97 %     Weight 10/06/19 1605 144 lb (65.3 kg)     Height 10/06/19 1605 5\' 4"  (1.626 m)     Head Circumference --      Peak Flow --      Pain Score 10/06/19 1605 0     Pain Loc --      Pain Edu? --      Excl. in [place]? --    Constitutional: Alert and oriented. Well appearing and in no distress. Eyes: Normal exam ENT      Head: Normocephalic and atraumatic.       Mouth/Throat: Mucous membranes are moist. Cardiovascular: Irregular rhythm rate around 150 bpm. Respiratory: Normal respiratory effort without tachypnea nor retractions. Breath sounds are clear Gastrointestinal: Soft and nontender. No distention.   Musculoskeletal: Nontender with normal range of motion in all extremities.  Neurologic:  Normal speech and language. No gross focal neurologic deficits  Skin:  Skin is warm, dry and intact.  Psychiatric: Mood and affect are normal.   ____________________________________________    EKG  EKG viewed and interpreted by myself shows atrial fibrillation with rapid ventricular response at 164 bpm with a narrow QRS, normal axis, normal intervals nonspecific ST changes.  ____________________________________________    RADIOLOGY  Chest x-ray shows new multifocal airspace opacity right greater than left.  ____________________________________________   INITIAL IMPRESSION / ASSESSMENT AND PLAN / ED COURSE  Pertinent labs & imaging results that were available during my care of the patient were reviewed by me and considered in my medical decision making (see chart for details).   Patient presents to the emergency department for shortness of breath.  Patient found to be in atrial fibrillation with rapid ventricular response at 160 to 180 bpm.  Patient states she does not feel her heart racing but does feel mild chest tightness and feels mildly short of breath.  Patient denies any history of atrial fibrillation previously.  We will check labs including cardiac enzymes.  We will dose IV fluids and diltiazem and continue to closely monitor the patient in the emergency department.  Reassuringly patient's blood pressure remains hypertensive 139/99.  Patient's labs have resulted showing a troponin elevation of 41 likely demand ischemia.  Remainder lab work is largely [place].  Patient's x-ray does show multifocal opacities, however with a new A. fib  with RVR this could the interstitial edema versus infectious process.  We will swab for coronavirus rapid and PCR.  Patient continues to be in A. fib RVR around 130 despite IV diltiazem.  We will start on a diltiazem infusion and admit to the hospitalist service for further treatment.  ELARA BATEMON was evaluated in Emergency Department on 10/06/2019 for the symptoms described in the history of present illness. She was evaluated in the context of the global COVID-19 pandemic, which necessitated consideration that the patient might be at risk for infection with the SARS-CoV-2 virus that causes COVID-19. Institutional protocols and algorithms that pertain to the evaluation of patients at risk for COVID-19 are in a state of rapid change based on information released by regulatory bodies including the CDC and federal and state organizations. These policies and algorithms were followed during the patient's care in the ED.  CRITICAL CARE Performed by: Harvest Dark  Total critical care time: 30 minutes  Critical care time was exclusive of separately billable procedures and treating other patients.  Critical care was necessary to treat or prevent imminent or life-threatening deterioration.  Critical care was time spent personally by me on the following activities: development of treatment plan with patient and/or surrogate as well as nursing, discussions with consultants, evaluation of patient's response to treatment, examination of patient, obtaining history from patient or surrogate, ordering and performing treatments and interventions, ordering and review of laboratory studies, ordering and review of radiographic studies, pulse oximetry and re-evaluation of patient's condition.   ____________________________________________   FINAL CLINICAL IMPRESSION(S) / ED DIAGNOSES  New onset atrial fibrillation with rapid ventricular response   Harvest Dark, MD 10/06/19 1757

## 2019-10-06 NOTE — ED Notes (Signed)
Off hold att after 15 min, floor notified of coming to room for bedside report

## 2019-10-07 ENCOUNTER — Inpatient Hospital Stay: Payer: PPO

## 2019-10-07 ENCOUNTER — Inpatient Hospital Stay (HOSPITAL_COMMUNITY)
Admit: 2019-10-07 | Discharge: 2019-10-07 | Disposition: A | Discharge status: Home / Self Care | Payer: PPO | Attending: Cardiology | Admitting: Cardiology

## 2019-10-07 ENCOUNTER — Other Ambulatory Visit: Payer: Self-pay

## 2019-10-07 DIAGNOSIS — I4891 Unspecified atrial fibrillation: Principal | ICD-10-CM

## 2019-10-07 DIAGNOSIS — I340 Nonrheumatic mitral (valve) insufficiency: Secondary | ICD-10-CM

## 2019-10-07 DIAGNOSIS — I361 Nonrheumatic tricuspid (valve) insufficiency: Secondary | ICD-10-CM

## 2019-10-07 DIAGNOSIS — I10 Essential (primary) hypertension: Secondary | ICD-10-CM

## 2019-10-07 DIAGNOSIS — R0600 Dyspnea, unspecified: Secondary | ICD-10-CM

## 2019-10-07 LAB — ECHOCARDIOGRAM COMPLETE
Height: 62 in
Weight: 2414.4 oz

## 2019-10-07 LAB — LIPID PANEL
Cholesterol: 150 mg/dL (ref 0–200)
HDL: 41 mg/dL (ref >40)
LDL Cholesterol: 96 mg/dL (ref 0–99)
Total CHOL/HDL Ratio: 3.7 RATIO
Triglycerides: 64 mg/dL (ref <150)
VLDL: 13 mg/dL (ref 0–40)

## 2019-10-07 LAB — BASIC METABOLIC PANEL
Anion gap: 10 (ref 5–15)
BUN: 17 mg/dL (ref 8–23)
CO2: 20 mmol/L — ABNORMAL LOW (ref 22–32)
Calcium: 8 mg/dL — ABNORMAL LOW (ref 8.9–10.3)
Chloride: 107 mmol/L (ref 98–111)
Creatinine, Ser: 0.78 mg/dL (ref 0.44–1.00)
GFR calc Af Amer: >60 mL/min (ref >60)
GFR calc non Af Amer: >60 mL/min (ref >60)
Glucose, Bld: 271 mg/dL — ABNORMAL HIGH (ref 70–99)
Potassium: 3.7 mmol/L (ref 3.5–5.1)
Sodium: 137 mmol/L (ref 135–145)

## 2019-10-07 LAB — GLUCOSE, CAPILLARY
Glucose-Capillary: 117 mg/dL — ABNORMAL HIGH (ref 70–99)
Glucose-Capillary: 177 mg/dL — ABNORMAL HIGH (ref 70–99)
Glucose-Capillary: 141 mg/dL — ABNORMAL HIGH (ref 70–99)
Glucose-Capillary: 150 mg/dL — ABNORMAL HIGH (ref 70–99)

## 2019-10-07 LAB — PROCALCITONIN: Procalcitonin: <0.1 ng/mL

## 2019-10-07 LAB — TROPONIN I (HIGH SENSITIVITY): Troponin I (High Sensitivity): 43 ng/L — ABNORMAL HIGH (ref <18)

## 2019-10-07 LAB — POC SARS CORONAVIRUS 2 AG: SARS Coronavirus 2 Ag: NEGATIVE

## 2019-10-07 MED ORDER — SODIUM CHLORIDE 0.9% FLUSH
10.0000 mL | Freq: Two times a day (BID) | INTRAVENOUS | Status: DC
  Administered 2019-10-07 – 2019-10-12 (×10): 10 mL via INTRAVENOUS

## 2019-10-07 MED ORDER — DILTIAZEM HCL 30 MG PO TABS
30.0000 mg | ORAL_TABLET | Freq: Four times a day (QID) | ORAL | Status: DC
  Administered 2019-10-07 – 2019-10-08 (×3): 30 mg via ORAL
  Filled 2019-10-07 (×3): qty 1

## 2019-10-07 MED ORDER — METOPROLOL TARTRATE 25 MG PO TABS
12.5000 mg | ORAL_TABLET | ORAL | Status: AC
  Administered 2019-10-07: 12.5 mg via ORAL
  Filled 2019-10-07: qty 1

## 2019-10-07 MED ORDER — METOPROLOL TARTRATE 25 MG PO TABS
25.0000 mg | ORAL_TABLET | Freq: Two times a day (BID) | ORAL | Status: DC
  Administered 2019-10-07 – 2019-10-09 (×4): 25 mg via ORAL
  Filled 2019-10-07 (×4): qty 1

## 2019-10-07 MED ORDER — DILTIAZEM HCL 30 MG PO TABS
60.0000 mg | ORAL_TABLET | Freq: Three times a day (TID) | ORAL | Status: DC
  Administered 2019-10-07: 60 mg via ORAL
  Filled 2019-10-07: qty 2

## 2019-10-07 MED ORDER — METOPROLOL TARTRATE 5 MG/5ML IV SOLN
2.5000 mg | INTRAVENOUS | Status: DC | PRN
  Administered 2019-10-07 – 2019-10-10 (×4): 2.5 mg via INTRAVENOUS
  Filled 2019-10-07 (×5): qty 5

## 2019-10-07 NOTE — Progress Notes (Addendum)
Pt. MEWS is a 4 it is not an acute change. Heart rate in the 140's  Notified Rapid Response Nurse and MD. Given new orders. Nurse will continue to monitor.

## 2019-10-07 NOTE — Progress Notes (Signed)
*  PRELIMINARY RESULTS* Echocardiogram 2D Echocardiogram has been performed.  Vanessa Gentry 10/07/2019, 2:52 PM

## 2019-10-07 NOTE — Consult Note (Signed)
Cardiology Consultation:   Patient ID: Vanessa Gentry MRN: [id number]; DOB: 07-20-1933  Admit date: 10/06/2019 Date of Consult: 10/07/2019  Primary Care Provider: Ronnell Freshwater, NP Primary Cardiologist: [place] rounding Primary Electrophysiologist:  None    Patient Profile:   Vanessa Gentry is a 86 y.o. female with a hx of diabetes, hypertension who is being seen today for the evaluation of atrial flutter with rapid ventricular response at the request of Dr. Linda Hedges.  History of Present Illness:   Vanessa Gentry is an 86 year old female with history of diabetes, hypertension who presents due to worsening shortness of breath and cough over the past 5 days.  She describes symptoms of heavy breathing which got worse.  Denies fever or chills, denies chest pain or palpitations.  Yesterday, she presented at a urgent care and was recommended to come to the emergency room.  She denies any history of heart disease.  In the ED, she was found to be in atrial fibrillation with rapid ventricular response.  She was started on IV diltiazem drip and oral beta-blocker.  Upon my exam, she states feeling much better compared to admission.  Her chest x-ray showed multifocal bilateral airspace opacity and small effusions.  Chest x-ray today showed the same findings suggesting multifocal pneumonia.  Heart Pathway Score:     Past Medical History:  Diagnosis Date  . Arthritis   . Diabetes mellitus without complication ([place])   . Dysrhythmia   . GERD (gastroesophageal reflux disease)   . Hypertension   . Pneumonia     Past Surgical History:  Procedure Laterality Date  . ABDOMINAL HYSTERECTOMY    . BREAST BIOPSY Left 2011   NEG  . CATARACT EXTRACTION W/PHACO Right 03/13/2015   Procedure: CATARACT EXTRACTION PHACO AND INTRAOCULAR LENS PLACEMENT (IOC);  Surgeon: Leandrew Koyanagi, MD;  Location: [facility] ORS;  Service: Ophthalmology;  Laterality: Right;  [place]  2:40  AP 20.3   CDE 32.41 cassette lot #    [id number]  . CHOLECYSTECTOMY    . ORIF ANKLE FRACTURE    . UPPER GI ENDOSCOPY       Home Medications:  Prior to Admission medications   Medication Sig Start Date End Date Taking? Authorizing Provider  aspirin EC 81 MG tablet Take 81 mg by mouth daily.   Yes [provider]  Biotin 10 MG TABS Take 10 mg by mouth daily.   Yes [provider]  metFORMIN (GLUCOPHAGE) 850 MG tablet Take 1 tablet (850 mg total) by mouth 2 (two) times daily with a meal. 09/12/19  Yes Lavera Guise, MD  valsartan (DIOVAN) 80 MG tablet Take 1 tablet (80 mg total) by mouth daily. 09/20/19  Yes Boscia, Greer Ee, NP  VITAMIN D PO Take 1,000 mg by mouth daily.    Yes [provider]    Inpatient Medications: Scheduled Meds: . apixaban  5 mg Oral BID  . diltiazem  60 mg Oral Q8H  . insulin aspart  0-15 Units Subcutaneous TID WC  . mouth rinse  15 mL Mouth Rinse BID  . metoprolol tartrate  25 mg Oral BID   Continuous Infusions: . diltiazem (CARDIZEM) infusion 15 mg/hr (10/07/19 0321)   PRN Meds: acetaminophen, ondansetron (ZOFRAN) IV  Allergies:    Allergies  Allergen Reactions  . Sulfa Antibiotics Other (See Comments)  . Iodinated Diagnostic Agents Rash    Betadine ok Betadine ok    Social History:   Social History   Socioeconomic History  . Marital  status: Divorced    Spouse name: Not on file  . Number of children: Not on file  . Years of education: Not on file  . Highest education level: Not on file  Occupational History  . Not on file  Tobacco Use  . Smoking status: Never Smoker  . Smokeless tobacco: Never Used  Substance and Sexual Activity  . Alcohol use: No  . Drug use: No  . Sexual activity: Not on file  Other Topics Concern  . Not on file  Social History Narrative  . Not on file   Social Determinants of Health   Financial Resource Strain:   . Difficulty of Paying Living Expenses: Not on file  Food Insecurity:   . Worried About Charity fundraiser  in the Last Year: Not on file  . Ran Out of Food in the Last Year: Not on file  Transportation Needs:   . Lack of Transportation (Medical): Not on file  . Lack of Transportation (Non-Medical): Not on file  Physical Activity:   . Days of Exercise per Week: Not on file  . Minutes of Exercise per Session: Not on file  Stress:   . Feeling of Stress : Not on file  Social Connections:   . Frequency of Communication with Friends and Family: Not on file  . Frequency of Social Gatherings with Friends and Family: Not on file  . Attends Religious Services: Not on file  . Active Member of Clubs or Organizations: Not on file  . Attends Archivist Meetings: Not on file  . Marital Status: Not on file  Intimate Partner Violence:   . Fear of Current or Ex-Partner: Not on file  . Emotionally Abused: Not on file  . Physically Abused: Not on file  . Sexually Abused: Not on file    Family History:    Family History  Problem Relation Age of Onset  . Breast cancer Neg Hx      ROS:  Please see the history of present illness.   All other ROS reviewed and negative.     Physical Exam/Data:   Vitals:   10/07/19 0319 10/07/19 0758 10/07/19 0929 10/07/19 1153  BP: 111/74 112/74  93/65  Pulse: 69 (!) 112 (!) 123 66  Resp: 20 18  16   Temp: 98 F (36.7 C) 98.3 F (36.8 C)  98.6 F (37 C)  TempSrc: Oral Oral  Oral  SpO2: 97% 97%  96%  Weight: 68.4 kg     Height:        Intake/Output Summary (Last 24 hours) at 10/07/2019 1206 Last data filed at 10/07/2019 0755 Gross per 24 hour  Intake 1000 ml  Output 1950 ml  Net -950 ml   Last 3 Weights 10/07/2019 10/06/2019 10/06/2019  Weight (lbs) 150 lb 14.4 oz 150 lb 14.4 oz 144 lb  Weight (kg) 68.448 kg 68.448 kg 65.318 kg     Body mass index is 27.6 kg/m.  General:  Well nourished, well developed, in no acute distress HEENT: normal Lymph: no adenopathy Neck: no JVD Endocrine:  No thryomegaly Vascular: No carotid bruits; FA pulses 2+  bilaterally without bruits  Cardiac: Regular irregular; no murmur  Lungs: Poor inspiratory effort, Rales at bases. Abd: soft, nontender, no hepatomegaly  Ext: no edema Musculoskeletal:  No deformities, BUE and BLE strength normal and equal Skin: warm and dry  Neuro:  CNs 2-12 intact, no focal abnormalities noted Psych:  Normal affect   EKG:  The EKG was  personally reviewed and demonstrates: Atrial flutter, heart rate 164. Telemetry:  Telemetry was personally reviewed and demonstrates: Atrial flutter, heart rate 96-1 14    Laboratory Data:  High Sensitivity Troponin:   Recent Labs  Lab 10/06/19 1604 10/06/19 2203 10/07/19 0020  TROPONINIHS 41* 38* 43*     Chemistry Recent Labs  Lab 10/06/19 1604 10/07/19 0020  NA 138 137  K 4.5 3.7  CL 105 107  CO2 21* 20*  GLUCOSE 170* 271*  BUN 20 17  CREATININE 0.89 0.78  CALCIUM 8.7* 8.0*  GFRNONAA 60* >60  GFRAA >60 >60  ANIONGAP 12 10    Recent Labs  Lab 10/06/19 1604  PROT 7.3  ALBUMIN 3.7  AST 46*  ALT 45*  ALKPHOS 74  BILITOT 1.2   Hematology Recent Labs  Lab 10/06/19 1604  WBC 8.6  RBC 4.40  HGB 12.7  HCT 39.5  MCV 89.8  MCH 28.9  MCHC 32.2  RDW 14.6  PLT 303   BNP Recent Labs  Lab 10/06/19 1604  BNP 240.0*    DDimer No results for input(s): DDIMER in the last 168 hours.   Radiology/Studies:  DG Chest 2 View  Result Date: 10/06/2019 CLINICAL DATA:  Cough and shortness of breath for several days. EXAM: CHEST - 2 VIEW COMPARISON:  04/22/2009 FINDINGS: Heart size is normal. Azygos fissure again noted. New multifocal ill-defined areas of airspace opacity are seen in both lungs, right side greater than left. Small bilateral pleural effusions are also noted. IMPRESSION: New multifocal bilateral airspace opacity, right side greater than left, and small bilateral pleural effusions. Electronically Signed   By: Marlaine Hind M.D.   On: 10/06/2019 16:55   DG Chest Port 1 View  Result Date:  10/07/2019 CLINICAL DATA:  Atrial fibrillation. Abnormal chest x-ray. EXAM: PORTABLE CHEST 1 VIEW COMPARISON:  Radiograph yesterday at 4:14 p.m. FINDINGS: Mild cardiomegaly. Small bilateral pleural effusions. Bilateral multifocal airspace opacity with slight worsening at the basis from prior exam. Peripheral septal thickening. Azygos fissure is noted. No pneumothorax. Degenerative change in the spine. IMPRESSION: 1. Multifocal bilateral airspace opacity with slight worsening since yesterday. Findings suggest multifocal pneumonia as well as pulmonary edema. 2. Mild cardiomegaly and small pleural effusions. Electronically Signed   By: Keith Rake M.D.   On: 10/07/2019 00:27     Assessment and Plan:   1. Afib/flutter with rvr -CHA2DS2-VASc of 5 -Cardizem 30 every 6, Lopressor 25 mg twice daily. -Titrate down diltiazem drip -Eliquis 5 mg twice daily -Get echocardiogram  2.  History of hypertension -Blood pressure controlled on Cardizem and beta-blocker  3.  Multifocal infiltrate/pneumonia on chest x-ray -Management as per primary team   Signed, Kate Sable, MD  10/07/2019 12:06 PM

## 2019-10-07 NOTE — Progress Notes (Signed)
PROGRESS NOTE    Vanessa Gentry  [id number] DOB: [birth year] DOA: 10/06/2019 PCP: Ronnell Freshwater, NP    Brief Narrative:  Vanessa Gentry is a 86 y.o. female with medical history significant of arthritis, diabetes, gastric reflux, hypertension, presents to the emergency department for shortness of breath. According to the patient for the past 1 week or so she has intermittently been feeling short of breath, worse with exertion. States she has had a dry cough as well. Denies any sputum production. Denies any fever. Patient states chest tightness at times but denies any pain.  1/31: Patient seen and examined.  EKG reviewed per cardiology.  Current rhythm atrial flutter.  Patient currently on diltiazem infusion at 15 cc/h.  Rate high 90s majority of the time however does not increase to 130s to 140s.  Patient overall stable.  Chest pain-free.  Case discussed with cardiology.  Recommending aggressive oral regimen with Cardizem p.o. as well as metoprolol p.o. with attempts to titrate off Cardizem infusion.    Assessment & Plan:   Active Problems:   Uncontrolled type 2 diabetes mellitus with hyperglycemia (HCC)   Essential hypertension   Atrial fibrillation with rapid ventricular response (HCC)   Dyspnea  Atrial fibrillation/flutter with rapid ventricular response Patient currently on Cardizem infusion CHA2DS2-VASc of 5 Continue Cardizem infusion for now we will attempt to titrate off Start p.o. Cardizem 30 mg every 6 hours Metoprolol tartrate 25 mg twice daily Continue apixaban 5 mg twice daily Continue telemetry monitoring  Possible multifocal pneumonia Unclear etiology of infiltrate noted on chest x-ray Clinically the patient is afebrile and does not appear septic or toxic Respiratory procalcitonin ordered If elevated will initiate empiric CAP coverage If negative no indication for antibiotics  Diabetes mellitus type 2 Last hemoglobin A1c 6.2 Home Metformin  held Sliding scale coverage Carb controlled diet  Hypertension Hold home ARB Cardizem as above Metoprolol as above  DVT prophylaxis: Eliquis Code Status: Full Family Communication: Son Avaiah Gulick via phone, [phone] on 1/31 Disposition Plan: Anticipate home, 24 to 48 hours once rate control achieved   Consultants:   Cardiology-[facility]  Procedures:   None  Antimicrobials:   None   Subjective: Patient seen and examined No acute events Remains on Cardizem infusion Chest pain-free  Objective: Vitals:   10/07/19 0758 10/07/19 0929 10/07/19 1153 10/07/19 1347  BP: 112/74  93/65 (!) 87/58  Pulse: (!) 112 (!) 123 66 70  Resp: 18  16   Temp: 98.3 F (36.8 C)  98.6 F (37 C)   TempSrc: Oral  Oral   SpO2: 97%  96% 95%  Weight:      Height:        Intake/Output Summary (Last 24 hours) at 10/07/2019 1358 Last data filed at 10/07/2019 0755 Gross per 24 hour  Intake 1000 ml  Output 1950 ml  Net -950 ml   Filed Weights   10/06/19 1605 10/06/19 2143 10/07/19 0319  Weight: 65.3 kg 68.4 kg 68.4 kg    Examination:  General exam: Appears calm and comfortable  Respiratory system: Clear to auscultation. Respiratory effort normal. Cardiovascular system: Tachycardic, regular rhythm, no murmur  gastrointestinal system: Abdomen is nondistended, soft and nontender. No organomegaly or masses felt. Normal bowel sounds heard. Central nervous system: Alert and oriented. No focal neurological deficits. Extremities: Symmetric 5 x 5 power. Skin: No rashes, lesions or ulcers Psychiatry: Judgement and insight appear normal. Mood & affect appropriate.     Data Reviewed: I have personally reviewed following  labs and imaging studies  CBC: Recent Labs  Lab 10/06/19 1604  WBC 8.6  NEUTROABS 6.6  HGB 12.7  HCT 39.5  MCV 89.8  PLT [id number]   Basic Metabolic Panel: Recent Labs  Lab 10/06/19 1604 10/07/19 0020  NA 138 137  K 4.5 3.7  CL 105 107  CO2 21* 20*  GLUCOSE  170* 271*  BUN 20 17  CREATININE 0.89 0.78  CALCIUM 8.7* 8.0*   GFR: Estimated Creatinine Clearance: 47.4 mL/min (by C-G formula based on SCr of 0.78 mg/dL). Liver Function Tests: Recent Labs  Lab 10/06/19 1604  AST 46*  ALT 45*  ALKPHOS 74  BILITOT 1.2  PROT 7.3  ALBUMIN 3.7   No results for input(s): LIPASE, AMYLASE in the last 168 hours. No results for input(s): AMMONIA in the last 168 hours. Coagulation Profile: No results for input(s): INR, PROTIME in the last 168 hours. Cardiac Enzymes: No results for input(s): CKTOTAL, CKMB, CKMBINDEX, TROPONINI in the last 168 hours. BNP (last 3 results) No results for input(s): PROBNP in the last 8760 hours. HbA1C: No results for input(s): HGBA1C in the last 72 hours. CBG: Recent Labs  Lab 10/06/19 2207 10/07/19 0809 10/07/19 1136  GLUCAP 188* 117* 177*   Lipid Profile: Recent Labs    10/07/19 0020  CHOL 150  HDL 41  LDLCALC 96  TRIG 64  CHOLHDL 3.7   Thyroid Function Tests: No results for input(s): TSH, T4TOTAL, FREET4, T3FREE, THYROIDAB in the last 72 hours. Anemia Panel: No results for input(s): VITAMINB12, FOLATE, FERRITIN, TIBC, IRON, RETICCTPCT in the last 72 hours. Sepsis Labs: No results for input(s): PROCALCITON, LATICACIDVEN in the last 168 hours.  Recent Results (from the past 240 hour(s))  Respiratory Panel by RT PCR (Flu A&B, Covid) - Nasopharyngeal Swab     Status: None   Collection Time: 10/06/19  6:12 PM   Specimen: Nasopharyngeal Swab  Result Value Ref Range Status   SARS Coronavirus 2 by RT PCR NEGATIVE NEGATIVE Final    Comment: (NOTE) SARS-CoV-2 target nucleic acids are NOT DETECTED. The SARS-CoV-2 RNA is generally detectable in upper respiratoy specimens during the acute phase of infection. The lowest concentration of SARS-CoV-2 viral copies this assay can detect is 131 copies/mL. A negative result does not preclude SARS-Cov-2 infection and should not be used as the sole basis for  treatment or other patient management decisions. A negative result may occur with  improper specimen collection/handling, submission of specimen other than nasopharyngeal swab, presence of viral mutation(s) within the areas targeted by this assay, and inadequate number of viral copies (<131 copies/mL). A negative result must be combined with clinical observations, patient history, and epidemiological information. The expected result is Negative. Fact Sheet for Patients:  PinkCheek.be Fact Sheet for Healthcare Providers:  GravelBags.it This test is not yet ap proved or cleared by the [place] FDA and  has been authorized for detection and/or diagnosis of SARS-CoV-2 by FDA under an Emergency Use Authorization (EUA). This EUA will remain  in effect (meaning this test can be used) for the duration of the COVID-19 declaration under Section 564(b)(1) of the Act, 21 U.S.C. section 360bbb-3(b)(1), unless the authorization is terminated or revoked sooner.    Influenza A by PCR NEGATIVE NEGATIVE Final   Influenza B by PCR NEGATIVE NEGATIVE Final    Comment: (NOTE) The Xpert Xpress SARS-CoV-2/FLU/RSV assay is intended as an aid in  the diagnosis of influenza from Nasopharyngeal swab specimens and  should not be used as  a sole basis for treatment. Nasal washings and  aspirates are unacceptable for Xpert Xpress SARS-CoV-2/FLU/RSV  testing. Fact Sheet for Patients: PinkCheek.be Fact Sheet for Healthcare Providers: GravelBags.it This test is not yet approved or cleared by the [place] FDA and  has been authorized for detection and/or diagnosis of SARS-CoV-2 by  FDA under an Emergency Use Authorization (EUA). This EUA will remain  in effect (meaning this test can be used) for the duration of the  Covid-19 declaration under Section 564(b)(1) of the Act, 21  U.S.C. section  360bbb-3(b)(1), unless the authorization is  terminated or revoked. Performed at [facility], [address] [place], [place] [address]          Radiology Studies: DG Chest 2 View  Result Date: 10/06/2019 CLINICAL DATA:  Cough and shortness of breath for several days. EXAM: CHEST - 2 VIEW COMPARISON:  04/22/2009 FINDINGS: Heart size is normal. Azygos fissure again noted. New multifocal ill-defined areas of airspace opacity are seen in both lungs, right side greater than left. Small bilateral pleural effusions are also noted. IMPRESSION: New multifocal bilateral airspace opacity, right side greater than left, and small bilateral pleural effusions. Electronically Signed   By: Marlaine Hind M.D.   On: 10/06/2019 16:55   DG Chest Port 1 View  Result Date: 10/07/2019 CLINICAL DATA:  Atrial fibrillation. Abnormal chest x-ray. EXAM: PORTABLE CHEST 1 VIEW COMPARISON:  Radiograph yesterday at 4:14 p.m. FINDINGS: Mild cardiomegaly. Small bilateral pleural effusions. Bilateral multifocal airspace opacity with slight worsening at the basis from prior exam. Peripheral septal thickening. Azygos fissure is noted. No pneumothorax. Degenerative change in the spine. IMPRESSION: 1. Multifocal bilateral airspace opacity with slight worsening since yesterday. Findings suggest multifocal pneumonia as well as pulmonary edema. 2. Mild cardiomegaly and small pleural effusions. Electronically Signed   By: Keith Rake M.D.   On: 10/07/2019 00:27        Scheduled Meds: . apixaban  5 mg Oral BID  . diltiazem  30 mg Oral Q6H  . insulin aspart  0-15 Units Subcutaneous TID WC  . mouth rinse  15 mL Mouth Rinse BID  . metoprolol tartrate  25 mg Oral BID   Continuous Infusions: . diltiazem (CARDIZEM) infusion 10 mg/hr (10/07/19 1344)     LOS: 1 day    Time spent: 35 minutes    Sidney Ace, MD [facility] Pager [phone]  If 7PM-7AM, please contact  night-coverage 10/07/2019, 1:58 PM

## 2019-10-07 NOTE — Progress Notes (Signed)
Pt resting comfortably, BP is 87/55 HR of 77. MD notified

## 2019-10-07 NOTE — Plan of Care (Signed)
  Problem: Clinical Measurements: Goal: Ability to maintain clinical measurements within normal limits will improve Outcome: Progressing Goal: Will remain free from infection Outcome: Progressing Goal: Diagnostic test results will improve Outcome: Progressing Goal: Cardiovascular complication will be avoided Outcome: Progressing   Problem: Education: Goal: Knowledge of disease or condition will improve Outcome: Progressing Goal: Understanding of medication regimen will improve Outcome: Progressing Goal: Individualized Educational Video(s) Outcome: Progressing   Problem: Activity: Goal: Ability to tolerate increased activity will improve Outcome: Progressing   Problem: Cardiac: Goal: Ability to achieve and maintain adequate cardiopulmonary perfusion will improve Outcome: Progressing

## 2019-10-07 NOTE — Progress Notes (Signed)
Pts PO Cardizem given, infusion titrated down to 10 as per order, BP was 93/65, HR at this time was 66. Will continue to monitor closely.

## 2019-10-07 NOTE — Plan of Care (Signed)
  Problem: Clinical Measurements: Goal: Cardiovascular complication will be avoided Outcome: Progressing   Problem: Education: Goal: Understanding of medication regimen will improve Outcome: Progressing   Problem: Cardiac: Goal: Ability to achieve and maintain adequate cardiopulmonary perfusion will improve Outcome: Progressing   Problem: Health Behavior/Discharge Planning: Goal: Ability to safely manage health-related needs after discharge will improve Outcome: Progressing   Problem: Education: Goal: Knowledge of disease or condition will improve Outcome: Progressing Goal: Understanding of medication regimen will improve Outcome: Progressing

## 2019-10-07 NOTE — Progress Notes (Signed)
pt was given po cardizem at 12, titrated down to 10 as written in order, , highest her HR  is 110, her current bp was 87/58 with a hr of 70. I also titrated her O2 down to 1L and she is sating at 97, pt is in no distress and expressed no needs at this time

## 2019-10-08 LAB — GLUCOSE, CAPILLARY
Glucose-Capillary: 136 mg/dL — ABNORMAL HIGH (ref 70–99)
Glucose-Capillary: 178 mg/dL — ABNORMAL HIGH (ref 70–99)
Glucose-Capillary: 138 mg/dL — ABNORMAL HIGH (ref 70–99)
Glucose-Capillary: 123 mg/dL — ABNORMAL HIGH (ref 70–99)

## 2019-10-08 MED ORDER — FUROSEMIDE 40 MG PO TABS
40.0000 mg | ORAL_TABLET | Freq: Every day | ORAL | Status: DC
  Administered 2019-10-08 – 2019-10-12 (×4): 40 mg via ORAL
  Filled 2019-10-08 (×5): qty 1

## 2019-10-08 MED ORDER — DILTIAZEM HCL 30 MG PO TABS
60.0000 mg | ORAL_TABLET | Freq: Four times a day (QID) | ORAL | Status: DC
  Administered 2019-10-08 – 2019-10-09 (×5): 60 mg via ORAL
  Filled 2019-10-08 (×6): qty 2

## 2019-10-08 NOTE — TOC Initial Note (Signed)
Transition of Care [facility]) - Initial/Assessment Note    Patient Details  Name: RIYANA FLAMING MRN: [id number] Date of Birth: [birth year]  [facility] CM/SW Contact:    Shelbie Ammons, RN Phone Number: 10/08/2019, 2:20 PM  Clinical Narrative:    RNCM assessed patient at bedside who was sitting up in bed having just completed breakfast. Patient reports to feeling somewhat better today and feels ready to go home once they get her medicine adjusted. Patient reports that she lives alone independently and still drives but that her children and other family members are close if she needs them. Discussed with patient possibility of having therapy in the home if this was something she needed and she reported that she would be agreeable to it. Patient reports that she has been up and down out of bed with assistance and using the bedside commode.  RNCM contacted MD for PT eval.  RNCM will re-evaluate after PT eval  Expected Discharge Plan: Home/Self Care Barriers to Discharge: Continued Medical Work up   Patient Goals and CMS Choice        Expected Discharge Plan and Services Expected Discharge Plan: Home/Self Care   Discharge Planning Services: CM Consult   Living arrangements for the past 2 months: Single Family Home                                      Prior Living Arrangements/Services Living arrangements for the past 2 months: Single Family Home Lives with:: Self   Do you feel safe going back to the place where you live?: Yes      Need for Family Participation in Patient Care: No (Comment) Care giver support system in place?: No (comment)   Criminal Activity/Legal Involvement Pertinent to Current Situation/Hospitalization: No - Comment as needed  Activities of Daily Living Home Assistive Devices/Equipment: Eyeglasses, CBG Meter ADL Screening (condition at time of admission) Patient's cognitive ability adequate to safely complete daily activities?: Yes Is the  patient deaf or have difficulty hearing?: Yes Does the patient have difficulty seeing, even when wearing glasses/contacts?: No Does the patient have difficulty concentrating, remembering, or making decisions?: No Patient able to express need for assistance with ADLs?: Yes Does the patient have difficulty dressing or bathing?: No Independently performs ADLs?: Yes (appropriate for developmental age) Does the patient have difficulty walking or climbing stairs?: Yes Weakness of Legs: None Weakness of Arms/Hands: None  Permission Sought/Granted                  Emotional Assessment Appearance:: Appears stated age Attitude/Demeanor/Rapport: Engaged Affect (typically observed): Appropriate Orientation: : Oriented to Self, Oriented to Place, Oriented to  Time, Oriented to Situation Alcohol / Substance Use: Never Used Psych Involvement: No (comment)  Admission diagnosis:  New onset atrial fibrillation ([place]) [I48.91] Abnormal CXR [R93.89] Atrial fibrillation with rapid ventricular response (HCC) [I48.91] Atrial fibrillation with RVR ([place]) [I48.91] Dyspnea, unspecified type [R06.00] Patient Active Problem List   Diagnosis Date Noted  . Dyspnea   . Atrial fibrillation with rapid ventricular response ([place]) 10/06/2019  . Alopecia 09/20/2019  . Type 2 diabetes mellitus with hyperglycemia, without long-term current use of insulin ([place]) 07/01/2019  . Tinea unguium 07/01/2019  . Cellulitis and abscess of hand 01/11/2019  . Essential hypertension 06/04/2018  . Uncontrolled type 2 diabetes mellitus with hyperglycemia ([place]) 12/04/2017  . Dysuria 12/04/2017  . Need for vaccination against Streptococcus  pneumoniae 12/04/2017  . Midline low back pain without sciatica 12/04/2017  . Encounter for general adult medical examination with abnormal findings 12/04/2017   PCP:  Ronnell Freshwater, NP Pharmacy:   [id number] - [place], [place] - [place]  [address] Phone: [phone] Fax: [phone]     Social Determinants of Health (SDOH) Interventions    Readmission Risk Interventions No flowsheet data found.

## 2019-10-08 NOTE — Plan of Care (Signed)
  Problem: Education: Goal: Understanding of medication regimen will improve Outcome: Progressing   Problem: Activity: Goal: Ability to tolerate increased activity will improve Outcome: Progressing   Problem: Education: Goal: Knowledge of disease or condition will improve Outcome: Progressing Goal: Understanding of medication regimen will improve Outcome: Progressing  PTS HR CONTINUES TO BE ELEVATED WITH PO MEDS, MD AWARE, CONTINUE TO MONITOR Problem: Cardiac: Goal: Ability to achieve and maintain adequate cardiopulmonary perfusion will improve Outcome: Not Progressing

## 2019-10-08 NOTE — Progress Notes (Signed)
Progress Note  Patient Name: Vanessa Gentry Date of Encounter: 10/08/2019  Primary Cardiologist: New- seen by Agbor-Etang   Subjective   She continues to be in atrial fibrillation but ventricular rate is more controlled.  She continues to complain of exertional dyspnea and dry cough.  Inpatient Medications    Scheduled Meds: . apixaban  5 mg Oral BID  . diltiazem  60 mg Oral Q6H  . insulin aspart  0-15 Units Subcutaneous TID WC  . mouth rinse  15 mL Mouth Rinse BID  . metoprolol tartrate  25 mg Oral BID  . sodium chloride flush  10 mL Intravenous Q12H   Continuous Infusions:  PRN Meds: acetaminophen, metoprolol tartrate, ondansetron (ZOFRAN) IV   Vital Signs    Vitals:   10/08/19 0000 10/08/19 0422 10/08/19 0746 10/08/19 1142  BP:  104/89 109/67 100/85  Pulse: (!) 120 (!) 112 86 91  Resp:   18 18  Temp:  97.9 F (36.6 C) 97.8 F (36.6 C) 98.1 F (36.7 C)  TempSrc:  Oral Oral   SpO2:  94% 96% 94%  Weight:  67.5 kg    Height:        Intake/Output Summary (Last 24 hours) at 10/08/2019 1500 Last data filed at 10/08/2019 0423 Gross per 24 hour  Intake --  Output 1050 ml  Net -1050 ml   Last 3 Weights 10/08/2019 10/07/2019 10/06/2019  Weight (lbs) 148 lb 12.8 oz 150 lb 14.4 oz 150 lb 14.4 oz  Weight (kg) 67.495 kg 68.448 kg 68.448 kg      Telemetry    Atrial fibrillation with ventricular rate between 70 to 100 bpm- Personally Reviewed  ECG     - Personally Reviewed  Physical Exam   GEN: No acute distress.   Neck: No JVD Cardiac:  Irregularly irregular , no murmurs, rubs, or gallops.  Respiratory:  Mildly diminished breath sound at the base with few crackles GI: Soft, nontender, non-distended  MS: No edema; No deformity. Neuro:  Nonfocal  Psych: Normal affect   Labs    High Sensitivity Troponin:   Recent Labs  Lab 10/06/19 1604 10/06/19 2203 10/07/19 0020  TROPONINIHS 41* 38* 43*      Chemistry Recent Labs  Lab 10/06/19 1604 10/07/19 0020   NA 138 137  K 4.5 3.7  CL 105 107  CO2 21* 20*  GLUCOSE 170* 271*  BUN 20 17  CREATININE 0.89 0.78  CALCIUM 8.7* 8.0*  PROT 7.3  --   ALBUMIN 3.7  --   AST 46*  --   ALT 45*  --   ALKPHOS 74  --   BILITOT 1.2  --   GFRNONAA 60* >60  GFRAA >60 >60  ANIONGAP 12 10     Hematology Recent Labs  Lab 10/06/19 1604  WBC 8.6  RBC 4.40  HGB 12.7  HCT 39.5  MCV 89.8  MCH 28.9  MCHC 32.2  RDW 14.6  PLT 303    BNP Recent Labs  Lab 10/06/19 1604  BNP 240.0*     DDimer No results for input(s): DDIMER in the last 168 hours.   Radiology    DG Chest 2 View  Result Date: 10/06/2019 CLINICAL DATA:  Cough and shortness of breath for several days. EXAM: CHEST - 2 VIEW COMPARISON:  04/22/2009 FINDINGS: Heart size is normal. Azygos fissure again noted. New multifocal ill-defined areas of airspace opacity are seen in both lungs, right side greater than left. Small bilateral pleural effusions  are also noted. IMPRESSION: New multifocal bilateral airspace opacity, right side greater than left, and small bilateral pleural effusions. Electronically Signed   By: Marlaine Hind M.D.   On: 10/06/2019 16:55   DG Chest Port 1 View  Result Date: 10/07/2019 CLINICAL DATA:  Atrial fibrillation. Abnormal chest x-ray. EXAM: PORTABLE CHEST 1 VIEW COMPARISON:  Radiograph yesterday at 4:14 p.m. FINDINGS: Mild cardiomegaly. Small bilateral pleural effusions. Bilateral multifocal airspace opacity with slight worsening at the basis from prior exam. Peripheral septal thickening. Azygos fissure is noted. No pneumothorax. Degenerative change in the spine. IMPRESSION: 1. Multifocal bilateral airspace opacity with slight worsening since yesterday. Findings suggest multifocal pneumonia as well as pulmonary edema. 2. Mild cardiomegaly and small pleural effusions. Electronically Signed   By: Keith Rake M.D.   On: 10/07/2019 00:27   ECHOCARDIOGRAM COMPLETE  Result Date: 10/07/2019   ECHOCARDIOGRAM REPORT    Patient Name:   Vanessa Gentry Date of Exam: 10/07/2019 Medical Rec #:  [id number]         Height:       62.0 in Accession #:    [id number]        Weight:       150.9 lb Date of Birth:  [birth year]          BSA:          1.70 m Patient Age:    86 years          BP:           112/74 mmHg Patient Gender: F                 HR:           112 bpm. Exam Location:  [facility] Procedure: 2D Echo Indications:     afib  History:         Patient has no prior history of Echocardiogram examinations.                  Risk Factors:Hypertension and Diabetes.  Sonographer:     Alyse Low Roar Referring Phys:  [id number] Kate Sable Diagnosing Phys: Kate Sable MD IMPRESSIONS  1. Left ventricular ejection fraction, by visual estimation, is 45 to 50%. The left ventricle has mild to moderately decreased function. There is no left ventricular hypertrophy.  2. Left ventricular diastolic parameters are indeterminate.  3. The left ventricle demonstrates global hypokinesis.  4. Global right ventricle has mildly reduced systolic function.The right ventricular size is normal. Right vetricular wall thickness was not assessed.  5. Left atrial size was moderately dilated.  6. Right atrial size was normal.  7. Large pleural effusion in the left lateral region.  8. Mild mitral annular calcification.  9. The mitral valve is degenerative. Mild mitral valve regurgitation. 10. The tricuspid valve is grossly normal. 11. The tricuspid valve is grossly normal. Tricuspid valve regurgitation is mild-moderate. 12. The aortic valve is grossly normal. Aortic valve regurgitation is not visualized. 13. Pulmonic regurgitation is mild. 14. The pulmonic valve was not well visualized. Pulmonic valve regurgitation is mild. 15. The inferior vena cava is dilated in size with <50% respiratory variability, suggesting right atrial pressure of 15 mmHg. FINDINGS  Left Ventricle: Left ventricular ejection fraction, by visual estimation, is 45 to 50%. The left ventricle has  mild to moderately decreased function. The left ventricle demonstrates global hypokinesis. The left ventricular internal cavity size was the left ventricle is normal in size. There is no left ventricular hypertrophy. Left ventricular diastolic  parameters are indeterminate. Right Ventricle: The right ventricular size is normal. Right vetricular wall thickness was not assessed. Global RV systolic function is has mildly reduced systolic function. Left Atrium: Left atrial size was moderately dilated. Right Atrium: Right atrial size was normal in size Pericardium: There is no evidence of pericardial effusion. There is a large pleural effusion in the left lateral region. Mitral Valve: The mitral valve is degenerative in appearance. Mild mitral annular calcification. Mild mitral valve regurgitation. Tricuspid Valve: The tricuspid valve is grossly normal. Tricuspid valve regurgitation is mild-moderate. Aortic Valve: The aortic valve is grossly normal. Aortic valve regurgitation is not visualized. Aortic valve mean gradient measures 3.0 mmHg. Aortic valve peak gradient measures 6.7 mmHg. Aortic valve area, by VTI measures 1.43 cm. Pulmonic Valve: The pulmonic valve was not well visualized. Pulmonic valve regurgitation is mild. Pulmonic regurgitation is mild. Aorta: The aortic root is normal in size and structure. Venous: The inferior vena cava is dilated in size with less than 50% respiratory variability, suggesting right atrial pressure of 15 mmHg. IAS/Shunts: No atrial level shunt detected by color flow Doppler.  LEFT VENTRICLE PLAX 2D LVIDd:         4.85 cm       Diastology LVIDs:         3.89 cm       LV e' lateral:   7.62 cm/s LV PW:         1.05 cm       LV E/e' lateral: 14.4 LV IVS:        1.06 cm       LV e' medial:    7.62 cm/s LVOT diam:     1.70 cm       LV E/e' medial:  14.4 LV SV:         45 ml LV SV Index:   25.51 LVOT Area:     2.27 cm  LV Volumes (MOD) LV area d, A2C:    18.30 cm LV area d, A4C:    20.30  cm LV area s, A2C:    14.40 cm LV area s, A4C:    15.00 cm LV major d, A2C:   5.35 cm LV major d, A4C:   5.76 cm LV major s, A2C:   5.31 cm LV major s, A4C:   5.33 cm LV vol d, MOD A2C: 51.2 ml LV vol d, MOD A4C: 57.7 ml LV vol s, MOD A2C: 33.2 ml LV vol s, MOD A4C: 35.3 ml LV SV MOD A2C:     18.0 ml LV SV MOD A4C:     57.7 ml LV SV MOD BP:      22.2 ml RIGHT VENTRICLE RV S prime:     8.81 cm/s TAPSE (M-mode): 1.3 cm LEFT ATRIUM             Index       RIGHT ATRIUM           Index LA diam:        4.20 cm 2.48 cm/m  RA Area:     14.80 cm LA Vol (A2C):   51.0 ml 30.07 ml/m RA Volume:   38.60 ml  22.76 ml/m LA Vol (A4C):   82.2 ml 48.47 ml/m LA Biplane Vol: 65.5 ml 38.62 ml/m  AORTIC VALVE                   PULMONIC VALVE AV Area (Vmax):    1.55 cm  PV Vmax:        0.54 m/s AV Area (Vmean):   1.70 cm    PV Peak grad:   1.2 mmHg AV Area (VTI):     1.43 cm    RVOT Peak grad: 0 mmHg AV Vmax:           129.00 cm/s AV Vmean:          76.200 cm/s AV VTI:            0.205 m AV Peak Grad:      6.7 mmHg AV Mean Grad:      3.0 mmHg LVOT Vmax:         87.90 cm/s LVOT Vmean:        57.000 cm/s LVOT VTI:          0.129 m LVOT/AV VTI ratio: 0.63  AORTA Ao Root diam: 2.80 cm MITRAL VALVE                        TRICUSPID VALVE MV Area (PHT): 5.02 cm             TR Peak grad:   20.5 mmHg MV PHT:        43.79 msec           TR Vmax:        237.00 cm/s MV Decel Time: 151 msec MV E velocity: 110.00 cm/s 103 cm/s SHUNTS                                     Systemic VTI:  0.13 m                                     Systemic Diam: 1.70 cm  Kate Sable MD Electronically signed by Kate Sable MD Signature Date/Time: 10/07/2019/3:17:15 PM    Final     Cardiac Studies   Echocardiogram was done yesterday:   1. Left ventricular ejection fraction, by visual estimation, is 45 to  50%. The left ventricle has mild to moderately decreased function. There  is no left ventricular hypertrophy.  2. Left ventricular  diastolic parameters are indeterminate.  3. The left ventricle demonstrates global hypokinesis.  4. Global right ventricle has mildly reduced systolic function.The right  ventricular size is normal. Right vetricular wall thickness was not  assessed.  5. Left atrial size was moderately dilated.  6. Right atrial size was normal.  7. Large pleural effusion in the left lateral region.  8. Mild mitral annular calcification.  9. The mitral valve is degenerative. Mild mitral valve regurgitation.  10. The tricuspid valve is grossly normal.  11. The tricuspid valve is grossly normal. Tricuspid valve regurgitation  is mild-moderate.  12. The aortic valve is grossly normal. Aortic valve regurgitation is not  visualized.  13. Pulmonic regurgitation is mild.  14. The pulmonic valve was not well visualized. Pulmonic valve  regurgitation is mild.  15. The inferior vena cava is dilated in size with <50% respiratory  variability, suggesting right atrial pressure of 15 mmHg.   Patient Profile     86 y.o. female with history of diabetes and hypertension who presented with shortness of breath and was found to be in A. fib with RVR.  Initially she was labeled as pneumonia based on chest x-ray finding  but she is afebrile with normal white cell count and normal procalcitonin.  Assessment & Plan    1.  Newly diagnosed atrial fibrillation: Ventricular rate is now more controlled on current dose of diltiazem and metoprolol.  She was started on anticoagulation with Eliquis 5 mg twice daily.  If ventricular rate remains controlled, we can continue with this strategy with plans for outpatient cardioversion after 3 to 4 weeks of anticoagulation.  If in the other hand her ventricular rate is difficult to control, TEE guided cardioversion might be needed.  2.  Dyspnea and cough: Initially she was diagnosed with pneumonia.  However, she is afebrile with normal white cell count and normal procalcitonin.  I  reviewed her chest x-ray and I suspect that she had some volume overload and might benefit from gentle diuresis.  Her blood pressure is soft and thus I am going to avoid IV furosemide for now and use a small dose of oral diuretic.  3.  Mildly elevated troponin: Likely due to supply demand ischemia.  Consider outpatient Lexiscan Myoview.  4.  Cardiomyopathy with mildly reduced EF at 45 to 50%, likely tachycardia induced.  Blood pressure does not allow the addition of an ACE inhibitor or ARB.       For questions or updates, please contact [place] Please consult [email or web address] for contact info under        Signed, Kathlyn [place], MD  10/08/2019, 3:00 PM

## 2019-10-08 NOTE — Evaluation (Addendum)
Physical Therapy Evaluation Patient Details Name: Vanessa Gentry MRN: [id number] DOB: [birth year] Today's Date: 10/08/2019   History of Present Illness  presented to ER secondary to chest tightness, SOB; admitted for management of afib with RVR due to acute CHF exacerbation.  Clinical Impression  Upon evaluation, patient alert and oriented; follows commands and eager for OOB activities as tolerated.  Bilat UE/LE strength and ROM grossly symmetrical and WFL. No focal weakness, sensory deficit or pain reported. Able to complete bed mobility with mod indep; sit/stand, basic transfers and gait (200') without assist device, sup.  Demonstrates reciprocal stepping pattern, fair cadence; veers L at times, but no overt buckling or LOB.  Completes head turns, start/stop and obstacle negotiation without safety concern.  Sats 94-95% on RA with exertion; HR 80-90s with exertion, peaking at 96bpm.  Denies chest tightness/pressure, endorses only mild SOB.  Left on RA end of session; RN informed/aware. Would benefit from skilled PT to address above deficits and promote optimal return to PLOF.; Recommend transition to HHPT upon discharge from acute hospitalization to promote cardiopulmonary endurance and appropriate activity pacing.     Follow Up Recommendations Home health PT    Equipment Recommendations       Recommendations for Other Services       Precautions / Restrictions Precautions Precautions: Fall Restrictions Weight Bearing Restrictions: No      Mobility  Bed Mobility Overal bed mobility: Modified Independent                Transfers Overall transfer level: Needs assistance Equipment used: None Transfers: Sit to/from Stand Sit to Stand: Supervision            Ambulation/Gait Ambulation/Gait assistance: Supervision Gait Distance (Feet): 200 Feet Assistive device: None   Gait velocity: 10' walk time, 9-10 seconds   General Gait Details: reciprocal stepping pattern,  fair cadence; veers L at times, but no overt buckling or LOB.  Completes head turns, start/stop and obstacle negotiation without safety concern.  Sats 94-95% with exertion; HR 80-90s with exertion, peaking at 96bpm.  Denies chest tightness/pressure, endorses only mild SOB.  Stairs            Wheelchair Mobility    Modified Rankin (Stroke Patients Only)       Balance Overall balance assessment: Needs assistance Sitting-balance support: No upper extremity supported;Feet supported Sitting balance-Leahy Scale: Good     Standing balance support: No upper extremity supported Standing balance-Leahy Scale: Fair                               Pertinent Vitals/Pain Pain Assessment: No/denies pain    Home Living Family/patient expects to be discharged to:: Private residence Living Arrangements: Alone Available Help at Discharge: Family;Available PRN/intermittently Type of Home: House Home Access: Stairs to enter   Entrance Stairs-Number of Steps: 1 Home Layout: One level        Prior Function Level of Independence: Independent               Hand Dominance        Extremity/Trunk Assessment   Upper Extremity Assessment Upper Extremity Assessment: Overall WFL for tasks assessed    Lower Extremity Assessment Lower Extremity Assessment: Overall WFL for tasks assessed(grossly 4/5 throughout)       Communication   Communication: No difficulties  Cognition Arousal/Alertness: Awake/alert Behavior During Therapy: WFL for tasks assessed/performed Overall Cognitive Status: Within Functional Limits for tasks assessed  General Comments      Exercises Other Exercises Other Exercises: Educated in role of activity pacing, energy conservation, signs/symptoms of fatigue; patient voiced understanding.  Will continue to reinforce throughout stay as appropriate.   Assessment/Plan    PT Assessment  Patient needs continued PT services  PT Problem List Decreased strength;Decreased activity tolerance;Decreased balance;Decreased mobility;Cardiopulmonary status limiting activity       PT Treatment Interventions DME instruction;Gait training;Stair training;Functional mobility training;Therapeutic activities;Therapeutic exercise;Balance training;Patient/family education    PT Goals (Current goals can be found in the Care Plan section)  Acute Rehab PT Goals Patient Stated Goal: to return home PT Goal Formulation: With patient Time For Goal Achievement: 10/22/19 Potential to Achieve Goals: Good    Frequency Min 2X/week   Barriers to discharge Decreased caregiver support      Co-evaluation               AM-PAC PT "6 Clicks" Mobility  Outcome Measure Help needed turning from your back to your side while in a flat bed without using bedrails?: None Help needed moving from lying on your back to sitting on the side of a flat bed without using bedrails?: None Help needed moving to and from a bed to a chair (including a wheelchair)?: None Help needed standing up from a chair using your arms (e.g., wheelchair or bedside chair)?: None Help needed to walk in hospital room?: None Help needed climbing 3-5 steps with a railing? : A Little 6 Click Score: 23    End of Session Equipment Utilized During Treatment: Gait belt Activity Tolerance: Patient tolerated treatment well Patient left: in chair;with call bell/phone within reach;with chair alarm set Nurse Communication: Mobility status PT Visit Diagnosis: Difficulty in walking, not elsewhere classified (R26.2);Muscle weakness (generalized) (M62.81)    Time: 1455-1510 PT Time Calculation (min) (ACUTE ONLY): 15 min   Charges:   PT Evaluation $PT Eval Moderate Complexity: 1 Mod PT Treatments $Therapeutic Activity: 8-22 mins        Rayaan Lorah H. Owens Shark, PT, DPT, NCS 10/08/19, 4:25 PM [phone]

## 2019-10-08 NOTE — Plan of Care (Signed)
  Problem: Education: Goal: Knowledge of General Education information will improve Description: Including pain rating scale, medication(s)/side effects and non-pharmacologic comfort measures Outcome: Progressing   Problem: Clinical Measurements: Goal: Ability to maintain clinical measurements within normal limits will improve Outcome: Progressing Goal: Will remain free from infection Outcome: Progressing Goal: Respiratory complications will improve Outcome: Progressing   Problem: Education: Goal: Knowledge of disease or condition will improve Outcome: Progressing Goal: Understanding of medication regimen will improve Outcome: Progressing

## 2019-10-08 NOTE — Progress Notes (Signed)
PROGRESS NOTE    Vanessa Gentry  [id number] DOB: 1933/06/17 DOA: 10/06/2019 PCP: Ronnell Freshwater, NP    Brief Narrative:  Vanessa Gentry is a 86 y.o. female with medical history significant of arthritis, diabetes, gastric reflux, hypertension, presents to the emergency department for shortness of breath. According to the patient for the past 1 week or so she has intermittently been feeling short of breath, worse with exertion. States she has had a dry cough as well. Denies any sputum production. Denies any fever. Patient states chest tightness at times but denies any pain.  1/31: Patient seen and examined.  EKG reviewed per cardiology.  Current rhythm atrial flutter.  Patient currently on diltiazem infusion at 15 cc/h.  Rate high 90s majority of the time however does not increase to 130s to 140s.  Patient overall stable.  Chest pain-free.  Case discussed with cardiology.  Recommending aggressive oral regimen with Cardizem p.o. as well as metoprolol p.o. with attempts to titrate off Cardizem infusion.  2/1: Patient seen and examined.  Diltiazem drip titrated off.  Overall heart rate improved however patient continues to have transient elevations up to 130s and 140s.  Patient overall stable.  Chest pain-free.  Case discussed with cardiology.  Will escalate oral regimen in effort to control heart rate    Assessment & Plan:   Active Problems:   Uncontrolled type 2 diabetes mellitus with hyperglycemia (HCC)   Essential hypertension   Atrial fibrillation with rapid ventricular response (HCC)   Dyspnea  Atrial fibrillation/flutter with rapid ventricular response Cardizem infusion titrated off CHA2DS2-VASc of 5 Increase p.o. Cardizem to 60 mg every 6 hours Metoprolol tartrate 25 mg twice daily Continue apixaban 5 mg twice daily Continue telemetry monitoring  Possible multifocal pneumonia, ruled out Unclear etiology of infiltrate noted on chest x-ray Clinically the patient is  afebrile and does not appear septic or toxic Respiratory procalcitonin ordered-negative No indication for antibiotics  Diabetes mellitus type 2 Last hemoglobin A1c 6.2 Home Metformin held Sliding scale coverage Carb controlled diet  Hypertension Hold home ARB Cardizem as above Metoprolol as above  DVT prophylaxis: Eliquis Code Status: Full Family Communication: Son Vanessa Gentry via phone, [phone] on 1/31 Disposition Plan: Anticipate home, 24 to 48 hours once rate control achieved   Consultants:   Cardiology-[facility]  Procedures:   None  Antimicrobials:   None   Subjective: Patient seen and examined No acute events Chest pain-free Titrated off Cardizem drip  Objective: Vitals:   10/08/19 0000 10/08/19 0422 10/08/19 0746 10/08/19 1142  BP:  104/89 109/67 100/85  Pulse: (!) 120 (!) 112 86 91  Resp:   18 18  Temp:  97.9 F (36.6 C) 97.8 F (36.6 C) 98.1 F (36.7 C)  TempSrc:  Oral Oral   SpO2:  94% 96% 94%  Weight:  67.5 kg    Height:        Intake/Output Summary (Last 24 hours) at 10/08/2019 1444 Last data filed at 10/08/2019 0423 Gross per 24 hour  Intake 127.41 ml  Output 1050 ml  Net -922.59 ml   Filed Weights   10/06/19 2143 10/07/19 0319 10/08/19 0422  Weight: 68.4 kg 68.4 kg 67.5 kg    Examination:  General exam: Appears calm and comfortable  Respiratory system: Clear to auscultation. Respiratory effort normal. Cardiovascular system: Tachycardic, regular rhythm, no murmur  gastrointestinal system: Abdomen is nondistended, soft and nontender. No organomegaly or masses felt. Normal bowel sounds heard. Central nervous system: Alert and oriented. No  focal neurological deficits. Extremities: Symmetric 5 x 5 power. Skin: No rashes, lesions or ulcers Psychiatry: Judgement and insight appear normal. Mood & affect appropriate.     Data Reviewed: I have personally reviewed following labs and imaging studies  CBC: Recent Labs  Lab  10/06/19 1604  WBC 8.6  NEUTROABS 6.6  HGB 12.7  HCT 39.5  MCV 89.8  PLT [id number]   Basic Metabolic Panel: Recent Labs  Lab 10/06/19 1604 10/07/19 0020  NA 138 137  K 4.5 3.7  CL 105 107  CO2 21* 20*  GLUCOSE 170* 271*  BUN 20 17  CREATININE 0.89 0.78  CALCIUM 8.7* 8.0*   GFR: Estimated Creatinine Clearance: 47.2 mL/min (by C-G formula based on SCr of 0.78 mg/dL). Liver Function Tests: Recent Labs  Lab 10/06/19 1604  AST 46*  ALT 45*  ALKPHOS 74  BILITOT 1.2  PROT 7.3  ALBUMIN 3.7   No results for input(s): LIPASE, AMYLASE in the last 168 hours. No results for input(s): AMMONIA in the last 168 hours. Coagulation Profile: No results for input(s): INR, PROTIME in the last 168 hours. Cardiac Enzymes: No results for input(s): CKTOTAL, CKMB, CKMBINDEX, TROPONINI in the last 168 hours. BNP (last 3 results) No results for input(s): PROBNP in the last 8760 hours. HbA1C: No results for input(s): HGBA1C in the last 72 hours. CBG: Recent Labs  Lab 10/07/19 1136 10/07/19 1634 10/07/19 2123 10/08/19 0836 10/08/19 1142  GLUCAP 177* 141* 150* 136* 178*   Lipid Profile: Recent Labs    10/07/19 0020  CHOL 150  HDL 41  LDLCALC 96  TRIG 64  CHOLHDL 3.7   Thyroid Function Tests: No results for input(s): TSH, T4TOTAL, FREET4, T3FREE, THYROIDAB in the last 72 hours. Anemia Panel: No results for input(s): VITAMINB12, FOLATE, FERRITIN, TIBC, IRON, RETICCTPCT in the last 72 hours. Sepsis Labs: Recent Labs  Lab 10/07/19 1405  PROCALCITON <0.10    Recent Results (from the past 240 hour(s))  Respiratory Panel by RT PCR (Flu A&B, Covid) - Nasopharyngeal Swab     Status: None   Collection Time: 10/06/19  6:12 PM   Specimen: Nasopharyngeal Swab  Result Value Ref Range Status   SARS Coronavirus 2 by RT PCR NEGATIVE NEGATIVE Final    Comment: (NOTE) SARS-CoV-2 target nucleic acids are NOT DETECTED. The SARS-CoV-2 RNA is generally detectable in upper  respiratoy specimens during the acute phase of infection. The lowest concentration of SARS-CoV-2 viral copies this assay can detect is 131 copies/mL. A negative result does not preclude SARS-Cov-2 infection and should not be used as the sole basis for treatment or other patient management decisions. A negative result may occur with  improper specimen collection/handling, submission of specimen other than nasopharyngeal swab, presence of viral mutation(s) within the areas targeted by this assay, and inadequate number of viral copies (<131 copies/mL). A negative result must be combined with clinical observations, patient history, and epidemiological information. The expected result is Negative. Fact Sheet for Patients:  PinkCheek.be Fact Sheet for Healthcare Providers:  GravelBags.it This test is not yet ap proved or cleared by the [place] FDA and  has been authorized for detection and/or diagnosis of SARS-CoV-2 by FDA under an Emergency Use Authorization (EUA). This EUA will remain  in effect (meaning this test can be used) for the duration of the COVID-19 declaration under Section 564(b)(1) of the Act, 21 U.S.C. section 360bbb-3(b)(1), unless the authorization is terminated or revoked sooner.    Influenza A by PCR NEGATIVE NEGATIVE  Final   Influenza B by PCR NEGATIVE NEGATIVE Final    Comment: (NOTE) The Xpert Xpress SARS-CoV-2/FLU/RSV assay is intended as an aid in  the diagnosis of influenza from Nasopharyngeal swab specimens and  should not be used as a sole basis for treatment. Nasal washings and  aspirates are unacceptable for Xpert Xpress SARS-CoV-2/FLU/RSV  testing. Fact Sheet for Patients: PinkCheek.be Fact Sheet for Healthcare Providers: GravelBags.it This test is not yet approved or cleared by the [place] FDA and  has been authorized for  detection and/or diagnosis of SARS-CoV-2 by  FDA under an Emergency Use Authorization (EUA). This EUA will remain  in effect (meaning this test can be used) for the duration of the  Covid-19 declaration under Section 564(b)(1) of the Act, 21  U.S.C. section 360bbb-3(b)(1), unless the authorization is  terminated or revoked. Performed at [facility], [address], [place], [place] [address]          Radiology Studies: DG Chest 2 View  Result Date: 10/06/2019 CLINICAL DATA:  Cough and shortness of breath for several days. EXAM: CHEST - 2 VIEW COMPARISON:  04/22/2009 FINDINGS: Heart size is normal. Azygos fissure again noted. New multifocal ill-defined areas of airspace opacity are seen in both lungs, right side greater than left. Small bilateral pleural effusions are also noted. IMPRESSION: New multifocal bilateral airspace opacity, right side greater than left, and small bilateral pleural effusions. Electronically Signed   By: Marlaine Hind M.D.   On: 10/06/2019 16:55   DG Chest Port 1 View  Result Date: 10/07/2019 CLINICAL DATA:  Atrial fibrillation. Abnormal chest x-ray. EXAM: PORTABLE CHEST 1 VIEW COMPARISON:  Radiograph yesterday at 4:14 p.m. FINDINGS: Mild cardiomegaly. Small bilateral pleural effusions. Bilateral multifocal airspace opacity with slight worsening at the basis from prior exam. Peripheral septal thickening. Azygos fissure is noted. No pneumothorax. Degenerative change in the spine. IMPRESSION: 1. Multifocal bilateral airspace opacity with slight worsening since yesterday. Findings suggest multifocal pneumonia as well as pulmonary edema. 2. Mild cardiomegaly and small pleural effusions. Electronically Signed   By: Keith Rake M.D.   On: 10/07/2019 00:27   ECHOCARDIOGRAM COMPLETE  Result Date: 10/07/2019   ECHOCARDIOGRAM REPORT   Patient Name:   SURVEEN MONSIVAIS Date of Exam: 10/07/2019 Medical Rec #:  [id number]         Height:       62.0 in Accession #:     [id number]        Weight:       150.9 lb Date of Birth:  10-27-1932          BSA:          1.70 m Patient Age:    5 years          BP:           112/74 mmHg Patient Gender: F                 HR:           112 bpm. Exam Location:  [facility] Procedure: 2D Echo Indications:     afib  History:         Patient has no prior history of Echocardiogram examinations.                  Risk Factors:Hypertension and Diabetes.  Sonographer:     Alyse Low Roar Referring Phys:  [id number] Kate Sable Diagnosing Phys: Kate Sable MD IMPRESSIONS  1. Left ventricular ejection fraction, by  visual estimation, is 45 to 50%. The left ventricle has mild to moderately decreased function. There is no left ventricular hypertrophy.  2. Left ventricular diastolic parameters are indeterminate.  3. The left ventricle demonstrates global hypokinesis.  4. Global right ventricle has mildly reduced systolic function.The right ventricular size is normal. Right vetricular wall thickness was not assessed.  5. Left atrial size was moderately dilated.  6. Right atrial size was normal.  7. Large pleural effusion in the left lateral region.  8. Mild mitral annular calcification.  9. The mitral valve is degenerative. Mild mitral valve regurgitation. 10. The tricuspid valve is grossly normal. 11. The tricuspid valve is grossly normal. Tricuspid valve regurgitation is mild-moderate. 12. The aortic valve is grossly normal. Aortic valve regurgitation is not visualized. 13. Pulmonic regurgitation is mild. 14. The pulmonic valve was not well visualized. Pulmonic valve regurgitation is mild. 15. The inferior vena cava is dilated in size with <50% respiratory variability, suggesting right atrial pressure of 15 mmHg. FINDINGS  Left Ventricle: Left ventricular ejection fraction, by visual estimation, is 45 to 50%. The left ventricle has mild to moderately decreased function. The left ventricle demonstrates global hypokinesis. The left ventricular internal cavity  size was the left ventricle is normal in size. There is no left ventricular hypertrophy. Left ventricular diastolic parameters are indeterminate. Right Ventricle: The right ventricular size is normal. Right vetricular wall thickness was not assessed. Global RV systolic function is has mildly reduced systolic function. Left Atrium: Left atrial size was moderately dilated. Right Atrium: Right atrial size was normal in size Pericardium: There is no evidence of pericardial effusion. There is a large pleural effusion in the left lateral region. Mitral Valve: The mitral valve is degenerative in appearance. Mild mitral annular calcification. Mild mitral valve regurgitation. Tricuspid Valve: The tricuspid valve is grossly normal. Tricuspid valve regurgitation is mild-moderate. Aortic Valve: The aortic valve is grossly normal. Aortic valve regurgitation is not visualized. Aortic valve mean gradient measures 3.0 mmHg. Aortic valve peak gradient measures 6.7 mmHg. Aortic valve area, by VTI measures 1.43 cm. Pulmonic Valve: The pulmonic valve was not well visualized. Pulmonic valve regurgitation is mild. Pulmonic regurgitation is mild. Aorta: The aortic root is normal in size and structure. Venous: The inferior vena cava is dilated in size with less than 50% respiratory variability, suggesting right atrial pressure of 15 mmHg. IAS/Shunts: No atrial level shunt detected by color flow Doppler.  LEFT VENTRICLE PLAX 2D LVIDd:         4.85 cm       Diastology LVIDs:         3.89 cm       LV e' lateral:   7.62 cm/s LV PW:         1.05 cm       LV E/e' lateral: 14.4 LV IVS:        1.06 cm       LV e' medial:    7.62 cm/s LVOT diam:     1.70 cm       LV E/e' medial:  14.4 LV SV:         45 ml LV SV Index:   25.51 LVOT Area:     2.27 cm  LV Volumes (MOD) LV area d, A2C:    18.30 cm LV area d, A4C:    20.30 cm LV area s, A2C:    14.40 cm LV area s, A4C:    15.00 cm LV major d, A2C:  5.35 cm LV major d, A4C:   5.76 cm LV major s,  A2C:   5.31 cm LV major s, A4C:   5.33 cm LV vol d, MOD A2C: 51.2 ml LV vol d, MOD A4C: 57.7 ml LV vol s, MOD A2C: 33.2 ml LV vol s, MOD A4C: 35.3 ml LV SV MOD A2C:     18.0 ml LV SV MOD A4C:     57.7 ml LV SV MOD BP:      22.2 ml RIGHT VENTRICLE RV S prime:     8.81 cm/s TAPSE (M-mode): 1.3 cm LEFT ATRIUM             Index       RIGHT ATRIUM           Index LA diam:        4.20 cm 2.48 cm/m  RA Area:     14.80 cm LA Vol (A2C):   51.0 ml 30.07 ml/m RA Volume:   38.60 ml  22.76 ml/m LA Vol (A4C):   82.2 ml 48.47 ml/m LA Biplane Vol: 65.5 ml 38.62 ml/m  AORTIC VALVE                   PULMONIC VALVE AV Area (Vmax):    1.55 cm    PV Vmax:        0.54 m/s AV Area (Vmean):   1.70 cm    PV Peak grad:   1.2 mmHg AV Area (VTI):     1.43 cm    RVOT Peak grad: 0 mmHg AV Vmax:           129.00 cm/s AV Vmean:          76.200 cm/s AV VTI:            0.205 m AV Peak Grad:      6.7 mmHg AV Mean Grad:      3.0 mmHg LVOT Vmax:         87.90 cm/s LVOT Vmean:        57.000 cm/s LVOT VTI:          0.129 m LVOT/AV VTI ratio: 0.63  AORTA Ao Root diam: 2.80 cm MITRAL VALVE                        TRICUSPID VALVE MV Area (PHT): 5.02 cm             TR Peak grad:   20.5 mmHg MV PHT:        43.79 msec           TR Vmax:        237.00 cm/s MV Decel Time: 151 msec MV E velocity: 110.00 cm/s 103 cm/s SHUNTS                                     Systemic VTI:  0.13 m                                     Systemic Diam: 1.70 cm  Kate Sable MD Electronically signed by Kate Sable MD Signature Date/Time: 10/07/2019/3:17:15 PM    Final         Scheduled Meds: . apixaban  5 mg Oral BID  . diltiazem  60 mg Oral Q6H  .  insulin aspart  0-15 Units Subcutaneous TID WC  . mouth rinse  15 mL Mouth Rinse BID  . metoprolol tartrate  25 mg Oral BID  . sodium chloride flush  10 mL Intravenous Q12H   Continuous Infusions:    LOS: 2 days    Time spent: 35 minutes    Sidney Ace, MD [facility] Pager [phone]  [phone]  If 7PM-7AM, please contact night-coverage 10/08/2019, 2:44 PM

## 2019-10-09 DIAGNOSIS — I429 Cardiomyopathy, unspecified: Secondary | ICD-10-CM

## 2019-10-09 LAB — GLUCOSE, CAPILLARY
Glucose-Capillary: 120 mg/dL — ABNORMAL HIGH (ref 70–99)
Glucose-Capillary: 184 mg/dL — ABNORMAL HIGH (ref 70–99)
Glucose-Capillary: 130 mg/dL — ABNORMAL HIGH (ref 70–99)
Glucose-Capillary: 124 mg/dL — ABNORMAL HIGH (ref 70–99)

## 2019-10-09 LAB — BASIC METABOLIC PANEL
Anion gap: 12 (ref 5–15)
BUN: 19 mg/dL (ref 8–23)
CO2: 24 mmol/L (ref 22–32)
Calcium: 8.4 mg/dL — ABNORMAL LOW (ref 8.9–10.3)
Chloride: 105 mmol/L (ref 98–111)
Creatinine, Ser: 0.8 mg/dL (ref 0.44–1.00)
GFR calc Af Amer: >60 mL/min (ref >60)
GFR calc non Af Amer: >60 mL/min (ref >60)
Glucose, Bld: 147 mg/dL — ABNORMAL HIGH (ref 70–99)
Potassium: 3.6 mmol/L (ref 3.5–5.1)
Sodium: 141 mmol/L (ref 135–145)

## 2019-10-09 MED ORDER — DIGOXIN 250 MCG PO TABS
0.2500 mg | ORAL_TABLET | Freq: Once | ORAL | Status: AC
  Administered 2019-10-09: 0.25 mg via ORAL
  Filled 2019-10-09: qty 1

## 2019-10-09 MED ORDER — POTASSIUM CHLORIDE CRYS ER 20 MEQ PO TBCR
40.0000 meq | EXTENDED_RELEASE_TABLET | Freq: Once | ORAL | Status: AC
  Administered 2019-10-09: 40 meq via ORAL
  Filled 2019-10-09: qty 2

## 2019-10-09 MED ORDER — DIGOXIN 125 MCG PO TABS: 0.1250 mg | ORAL_TABLET | Freq: Every day | ORAL | Status: DC

## 2019-10-09 MED ORDER — METOPROLOL TARTRATE 25 MG PO TABS
25.0000 mg | ORAL_TABLET | Freq: Four times a day (QID) | ORAL | Status: DC
  Administered 2019-10-09 – 2019-10-11 (×7): 25 mg via ORAL
  Filled 2019-10-09 (×7): qty 1

## 2019-10-09 MED ORDER — MAGNESIUM SULFATE IN D5W 1-5 GM/100ML-% IV SOLN
1.0000 g | Freq: Once | INTRAVENOUS | Status: AC
  Administered 2019-10-09: 1 g via INTRAVENOUS
  Filled 2019-10-09: qty 100

## 2019-10-09 MED ORDER — DIGOXIN 250 MCG PO TABS: 0.2500 mg | ORAL_TABLET | Freq: Once | ORAL | Status: DC

## 2019-10-09 MED ORDER — DIGOXIN 125 MCG PO TABS
0.1250 mg | ORAL_TABLET | Freq: Every day | ORAL | Status: DC
  Administered 2019-10-10 – 2019-10-11 (×2): 0.125 mg via ORAL
  Filled 2019-10-09 (×2): qty 1

## 2019-10-09 NOTE — TOC Progression Note (Signed)
Transition of Care [facility]) - Progression Note    Patient Details  Name: BRILYNN ROBBEN MRN: [id number] Date of Birth: [birth year]  Transition of [facility]) CM/SW [place], RN Phone Number: 10/09/2019, 3:54 PM  Clinical Narrative:       Expected Discharge Plan: Home/Self Care Barriers to Discharge: Continued Medical Work up  Expected Discharge Plan and Services Expected Discharge Plan: Home/Self Care   Discharge Planning Services: CM Consult   Living arrangements for the past 2 months: [place]: PT [place]: [employer] ([place]) Date [place]: 10/09/19 Time [place]: [id number] Representative spoke with at [place]: [place] ([place]) Interventions    Readmission Risk Interventions No flowsheet data found.

## 2019-10-09 NOTE — Progress Notes (Signed)
PT Cancellation Note  Patient Details Name: Vanessa Gentry MRN: [id number] DOB: March 22, 1933   Cancelled Treatment:     PT attempt x 2 this afternoon. Pt resting HR in 140s. Will hold this date but will continue to follow per POC.    Willette Pa 10/09/2019, 2:57 PM

## 2019-10-09 NOTE — Care Management Important Message (Signed)
Important Message  Patient Details  Name: Vanessa Gentry MRN: [id number] Date of Birth: 02/15/1933   Medicare Important Message Given:  Yes     [name] 10/09/2019, 11:26 AM

## 2019-10-09 NOTE — Progress Notes (Signed)
PROGRESS NOTE    Vanessa Gentry  [id number] DOB: [birth year] DOA: 10/06/2019 PCP: Ronnell Freshwater, NP    Brief Narrative:  Vanessa Gentry is a 86 y.o. female with medical history significant of arthritis, diabetes, gastric reflux, hypertension, presents to the emergency department for shortness of breath. According to the patient for the past 1 week or so she has intermittently been feeling short of breath, worse with exertion. States she has had a dry cough as well. Denies any sputum production. Denies any fever. Patient states chest tightness at times but denies any pain.  1/31: Patient seen and examined.  EKG reviewed per cardiology.  Current rhythm atrial flutter.  Patient currently on diltiazem infusion at 15 cc/h.  Rate high 90s majority of the time however does not increase to 130s to 140s.  Patient overall stable.  Chest pain-free.  Case discussed with cardiology.  Recommending aggressive oral regimen with Cardizem p.o. as well as metoprolol p.o. with attempts to titrate off Cardizem infusion.  2/1: Patient seen and examined.  Diltiazem drip titrated off.  Overall heart rate improved however patient continues to have transient elevations up to 130s and 140s.  Patient overall stable.  Chest pain-free.  Case discussed with cardiology.  Will escalate oral regimen in effort to control heart rate  2/2: Patient seen and examined.  Remained relatively stable on p.o. Cardizem and p.o. metoprolol.  Chest pain-free.  Patient continues to have some exertional dyspnea and cough.    Assessment & Plan:   Active Problems:   Uncontrolled type 2 diabetes mellitus with hyperglycemia (HCC)   Essential hypertension   Atrial fibrillation with rapid ventricular response (HCC)   Dyspnea  Atrial fibrillation/flutter with rapid ventricular response Cardizem infusion titrated off CHA2DS2-VASc of 5 Continue p.o. Cardizem 60 mg every 6 hours Metoprolol tartrate 25 mg twice daily Continue  apixaban 5 mg twice daily Continue telemetry monitoring Appreciate further cardiology recommendations Anticipate discharge to home with home health within 24 to 48 hours  Possible multifocal pneumonia, ruled out Unclear etiology of infiltrate noted on chest x-ray Clinically the patient is afebrile and does not appear septic or toxic Respiratory procalcitonin ordered-negative No indication for antibiotics  Diabetes mellitus type 2 Last hemoglobin A1c 6.2 Home Metformin held Sliding scale coverage Carb controlled diet  Hypertension Hold home ARB Cardizem as above Metoprolol as above  DVT prophylaxis: Eliquis Code Status: Full Family Communication: Son Vanessa Gentry via phone, [phone] on 2/2 Disposition Plan: Home with home health, anticipate 24 to 48 hours   Consultants:   Cardiology-[facility]  Procedures:   None  Antimicrobials:   None   Subjective: Patient seen and examined No acute events Chest pain-free Rate control improved on p.o. Cardizem and p.o. metoprolol  Objective: Vitals:   10/09/19 0743 10/09/19 0817 10/09/19 0942 10/09/19 1131  BP: 105/72  98/68 (!) 108/92  Pulse: 65 (!) 116 100 62  Resp: 16  18 18   Temp: 98.3 F (36.8 C)  98.3 F (36.8 C) (!) 97.5 F (36.4 C)  TempSrc: Oral  Oral Oral  SpO2: 94%  95% 96%  Weight:      Height:        Intake/Output Summary (Last 24 hours) at 10/09/2019 1333 Last data filed at 10/09/2019 1100 Gross per 24 hour  Intake 78.82 ml  Output 750 ml  Net -671.18 ml   Filed Weights   10/07/19 0319 10/08/19 0422 10/09/19 0451  Weight: 68.4 kg 67.5 kg 66.2 kg    Examination:  General exam: Appears calm and comfortable  Respiratory system: Clear to auscultation. Respiratory effort normal. Cardiovascular system: Normal rate, irregular rhythm, no murmur gastrointestinal system: Abdomen is nondistended, soft and nontender. No organomegaly or masses felt. Normal bowel sounds heard. Central nervous system: Alert  and oriented. No focal neurological deficits. Extremities: Symmetric 5 x 5 power. Skin: No rashes, lesions or ulcers Psychiatry: Judgement and insight appear normal. Mood & affect appropriate.     Data Reviewed: I have personally reviewed following labs and imaging studies  CBC: Recent Labs  Lab 10/06/19 1604  WBC 8.6  NEUTROABS 6.6  HGB 12.7  HCT 39.5  MCV 89.8  PLT [id number]   Basic Metabolic Panel: Recent Labs  Lab 10/06/19 1604 10/07/19 0020 10/09/19 0719  NA 138 137 141  K 4.5 3.7 3.6  CL 105 107 105  CO2 21* 20* 24  GLUCOSE 170* 271* 147*  BUN 20 17 19   CREATININE 0.89 0.78 0.80  CALCIUM 8.7* 8.0* 8.4*   GFR: Estimated Creatinine Clearance: 46.7 mL/min (by C-G formula based on SCr of 0.8 mg/dL). Liver Function Tests: Recent Labs  Lab 10/06/19 1604  AST 46*  ALT 45*  ALKPHOS 74  BILITOT 1.2  PROT 7.3  ALBUMIN 3.7   No results for input(s): LIPASE, AMYLASE in the last 168 hours. No results for input(s): AMMONIA in the last 168 hours. Coagulation Profile: No results for input(s): INR, PROTIME in the last 168 hours. Cardiac Enzymes: No results for input(s): CKTOTAL, CKMB, CKMBINDEX, TROPONINI in the last 168 hours. BNP (last 3 results) No results for input(s): PROBNP in the last 8760 hours. HbA1C: No results for input(s): HGBA1C in the last 72 hours. CBG: Recent Labs  Lab 10/08/19 1142 10/08/19 1645 10/08/19 2152 10/09/19 0746 10/09/19 1137  GLUCAP 178* 138* 123* 120* 184*   Lipid Profile: Recent Labs    10/07/19 0020  CHOL 150  HDL 41  LDLCALC 96  TRIG 64  CHOLHDL 3.7   Thyroid Function Tests: No results for input(s): TSH, T4TOTAL, FREET4, T3FREE, THYROIDAB in the last 72 hours. Anemia Panel: No results for input(s): VITAMINB12, FOLATE, FERRITIN, TIBC, IRON, RETICCTPCT in the last 72 hours. Sepsis Labs: Recent Labs  Lab 10/07/19 1405  PROCALCITON <0.10    Recent Results (from the past 240 hour(s))  Respiratory Panel by RT PCR (Flu  A&B, Covid) - Nasopharyngeal Swab     Status: None   Collection Time: 10/06/19  6:12 PM   Specimen: Nasopharyngeal Swab  Result Value Ref Range Status   SARS Coronavirus 2 by RT PCR NEGATIVE NEGATIVE Final    Comment: (NOTE) SARS-CoV-2 target nucleic acids are NOT DETECTED. The SARS-CoV-2 RNA is generally detectable in upper respiratoy specimens during the acute phase of infection. The lowest concentration of SARS-CoV-2 viral copies this assay can detect is 131 copies/mL. A negative result does not preclude SARS-Cov-2 infection and should not be used as the sole basis for treatment or other patient management decisions. A negative result may occur with  improper specimen collection/handling, submission of specimen other than nasopharyngeal swab, presence of viral mutation(s) within the areas targeted by this assay, and inadequate number of viral copies (<131 copies/mL). A negative result must be combined with clinical observations, patient history, and epidemiological information. The expected result is Negative. Fact Sheet for Patients:  PinkCheek.be Fact Sheet for Healthcare Providers:  GravelBags.it This test is not yet ap proved or cleared by the [place] FDA and  has been authorized for detection and/or diagnosis  of SARS-CoV-2 by FDA under an Emergency Use Authorization (EUA). This EUA will remain  in effect (meaning this test can be used) for the duration of the COVID-19 declaration under Section 564(b)(1) of the Act, 21 U.S.C. section 360bbb-3(b)(1), unless the authorization is terminated or revoked sooner.    Influenza A by PCR NEGATIVE NEGATIVE Final   Influenza B by PCR NEGATIVE NEGATIVE Final    Comment: (NOTE) The Xpert Xpress SARS-CoV-2/FLU/RSV assay is intended as an aid in  the diagnosis of influenza from Nasopharyngeal swab specimens and  should not be used as a sole basis for treatment. Nasal washings  and  aspirates are unacceptable for Xpert Xpress SARS-CoV-2/FLU/RSV  testing. Fact Sheet for Patients: PinkCheek.be Fact Sheet for Healthcare Providers: GravelBags.it This test is not yet approved or cleared by the [place] FDA and  has been authorized for detection and/or diagnosis of SARS-CoV-2 by  FDA under an Emergency Use Authorization (EUA). This EUA will remain  in effect (meaning this test can be used) for the duration of the  Covid-19 declaration under Section 564(b)(1) of the Act, 21  U.S.C. section 360bbb-3(b)(1), unless the authorization is  terminated or revoked. Performed at [facility], [address], [place], [place] [address]          Radiology Studies: ECHOCARDIOGRAM COMPLETE  Result Date: 10/07/2019   ECHOCARDIOGRAM REPORT   Patient Name:   Vanessa Gentry Date of Exam: 10/07/2019 Medical Rec #:  [id number]         Height:       62.0 in Accession #:    [id number]        Weight:       150.9 lb Date of Birth:  1933/06/25          BSA:          1.70 m Patient Age:    6 years          BP:           112/74 mmHg Patient Gender: F                 HR:           112 bpm. Exam Location:  [facility] Procedure: 2D Echo Indications:     afib  History:         Patient has no prior history of Echocardiogram examinations.                  Risk Factors:Hypertension and Diabetes.  Sonographer:     Alyse Low Roar Referring Phys:  [id number] Kate Sable Diagnosing Phys: Kate Sable MD IMPRESSIONS  1. Left ventricular ejection fraction, by visual estimation, is 45 to 50%. The left ventricle has mild to moderately decreased function. There is no left ventricular hypertrophy.  2. Left ventricular diastolic parameters are indeterminate.  3. The left ventricle demonstrates global hypokinesis.  4. Global right ventricle has mildly reduced systolic function.The right ventricular size is normal. Right vetricular wall  thickness was not assessed.  5. Left atrial size was moderately dilated.  6. Right atrial size was normal.  7. Large pleural effusion in the left lateral region.  8. Mild mitral annular calcification.  9. The mitral valve is degenerative. Mild mitral valve regurgitation. 10. The tricuspid valve is grossly normal. 11. The tricuspid valve is grossly normal. Tricuspid valve regurgitation is mild-moderate. 12. The aortic valve is grossly normal. Aortic valve regurgitation is not visualized. 13. Pulmonic regurgitation is mild. 14. The pulmonic  valve was not well visualized. Pulmonic valve regurgitation is mild. 15. The inferior vena cava is dilated in size with <50% respiratory variability, suggesting right atrial pressure of 15 mmHg. FINDINGS  Left Ventricle: Left ventricular ejection fraction, by visual estimation, is 45 to 50%. The left ventricle has mild to moderately decreased function. The left ventricle demonstrates global hypokinesis. The left ventricular internal cavity size was the left ventricle is normal in size. There is no left ventricular hypertrophy. Left ventricular diastolic parameters are indeterminate. Right Ventricle: The right ventricular size is normal. Right vetricular wall thickness was not assessed. Global RV systolic function is has mildly reduced systolic function. Left Atrium: Left atrial size was moderately dilated. Right Atrium: Right atrial size was normal in size Pericardium: There is no evidence of pericardial effusion. There is a large pleural effusion in the left lateral region. Mitral Valve: The mitral valve is degenerative in appearance. Mild mitral annular calcification. Mild mitral valve regurgitation. Tricuspid Valve: The tricuspid valve is grossly normal. Tricuspid valve regurgitation is mild-moderate. Aortic Valve: The aortic valve is grossly normal. Aortic valve regurgitation is not visualized. Aortic valve mean gradient measures 3.0 mmHg. Aortic valve peak gradient measures  6.7 mmHg. Aortic valve area, by VTI measures 1.43 cm. Pulmonic Valve: The pulmonic valve was not well visualized. Pulmonic valve regurgitation is mild. Pulmonic regurgitation is mild. Aorta: The aortic root is normal in size and structure. Venous: The inferior vena cava is dilated in size with less than 50% respiratory variability, suggesting right atrial pressure of 15 mmHg. IAS/Shunts: No atrial level shunt detected by color flow Doppler.  LEFT VENTRICLE PLAX 2D LVIDd:         4.85 cm       Diastology LVIDs:         3.89 cm       LV e' lateral:   7.62 cm/s LV PW:         1.05 cm       LV E/e' lateral: 14.4 LV IVS:        1.06 cm       LV e' medial:    7.62 cm/s LVOT diam:     1.70 cm       LV E/e' medial:  14.4 LV SV:         45 ml LV SV Index:   25.51 LVOT Area:     2.27 cm  LV Volumes (MOD) LV area d, A2C:    18.30 cm LV area d, A4C:    20.30 cm LV area s, A2C:    14.40 cm LV area s, A4C:    15.00 cm LV major d, A2C:   5.35 cm LV major d, A4C:   5.76 cm LV major s, A2C:   5.31 cm LV major s, A4C:   5.33 cm LV vol d, MOD A2C: 51.2 ml LV vol d, MOD A4C: 57.7 ml LV vol s, MOD A2C: 33.2 ml LV vol s, MOD A4C: 35.3 ml LV SV MOD A2C:     18.0 ml LV SV MOD A4C:     57.7 ml LV SV MOD BP:      22.2 ml RIGHT VENTRICLE RV S prime:     8.81 cm/s TAPSE (M-mode): 1.3 cm LEFT ATRIUM             Index       RIGHT ATRIUM           Index LA diam:  4.20 cm 2.48 cm/m  RA Area:     14.80 cm LA Vol (A2C):   51.0 ml 30.07 ml/m RA Volume:   38.60 ml  22.76 ml/m LA Vol (A4C):   82.2 ml 48.47 ml/m LA Biplane Vol: 65.5 ml 38.62 ml/m  AORTIC VALVE                   PULMONIC VALVE AV Area (Vmax):    1.55 cm    PV Vmax:        0.54 m/s AV Area (Vmean):   1.70 cm    PV Peak grad:   1.2 mmHg AV Area (VTI):     1.43 cm    RVOT Peak grad: 0 mmHg AV Vmax:           129.00 cm/s AV Vmean:          76.200 cm/s AV VTI:            0.205 m AV Peak Grad:      6.7 mmHg AV Mean Grad:      3.0 mmHg LVOT Vmax:         87.90 cm/s LVOT  Vmean:        57.000 cm/s LVOT VTI:          0.129 m LVOT/AV VTI ratio: 0.63  AORTA Ao Root diam: 2.80 cm MITRAL VALVE                        TRICUSPID VALVE MV Area (PHT): 5.02 cm             TR Peak grad:   20.5 mmHg MV PHT:        43.79 msec           TR Vmax:        237.00 cm/s MV Decel Time: 151 msec MV E velocity: 110.00 cm/s 103 cm/s SHUNTS                                     Systemic VTI:  0.13 m                                     Systemic Diam: 1.70 cm  Kate Sable MD Electronically signed by Kate Sable MD Signature Date/Time: 10/07/2019/3:17:15 PM    Final         Scheduled Meds: . apixaban  5 mg Oral BID  . diltiazem  60 mg Oral Q6H  . furosemide  40 mg Oral Daily  . insulin aspart  0-15 Units Subcutaneous TID WC  . mouth rinse  15 mL Mouth Rinse BID  . metoprolol tartrate  25 mg Oral BID  . sodium chloride flush  10 mL Intravenous Q12H   Continuous Infusions:    LOS: 3 days    Time spent: 35 minutes    Sidney Ace, MD [facility] Pager [phone]  If 7PM-7AM, please contact night-coverage 10/09/2019, 1:33 PM

## 2019-10-09 NOTE — Progress Notes (Signed)
Progress Note  Patient Name: Vanessa Gentry Date of Encounter: 10/09/2019  Primary Cardiologist: [facility], Dr. Garen Lah  Subjective   Relatively asx despite soft pressures and elevated rates in atrial fibrillation.  Reports an earlier episode of feeling short of breath and dizzy; however, she is unable to specify when this occurred and stated that it was just "earlier today."  Also feels intermittent palpitations. No chest pain, racing heart rate, abdominal distention, or lower extremity edema.  Inpatient Medications    Scheduled Meds: . apixaban  5 mg Oral BID  . diltiazem  60 mg Oral Q6H  . furosemide  40 mg Oral Daily  . insulin aspart  0-15 Units Subcutaneous TID WC  . mouth rinse  15 mL Mouth Rinse BID  . metoprolol tartrate  25 mg Oral BID  . sodium chloride flush  10 mL Intravenous Q12H   Continuous Infusions:  PRN Meds: acetaminophen, metoprolol tartrate, ondansetron (ZOFRAN) IV   Vital Signs    Vitals:   10/09/19 0817 10/09/19 0942 10/09/19 1131 10/09/19 1532  BP:  98/68 (!) 108/92 116/86  Pulse: (!) 116 100 62 84  Resp:  18 18 18   Temp:  98.3 F (36.8 C) (!) 97.5 F (36.4 C) 98.2 F (36.8 C)  TempSrc:  Oral Oral Oral  SpO2:  95% 96% 100%  Weight:      Height:        Intake/Output Summary (Last 24 hours) at 10/09/2019 1553 Last data filed at 10/09/2019 1527 Gross per 24 hour  Intake 78.82 ml  Output 1000 ml  Net -921.18 ml   Last 3 Weights 10/09/2019 10/08/2019 10/07/2019  Weight (lbs) 145 lb 14.4 oz 148 lb 12.8 oz 150 lb 14.4 oz  Weight (kg) 66.18 kg 67.495 kg 68.448 kg      Telemetry    Atrial fibrillation with rates into the 130s to 140s- Personally Reviewed  ECG    No new tracings- personally Reviewed  Physical Exam   GEN: No acute distress.  Resting comfortably in bed. Neck: No JVD Cardiac:  Tachycardic, IRIR, 1/6 systolic murmur. No rubs or gallops.  Respiratory:  Reduced bibasilar breath sounds GI: Soft, nontender, non-distended  MS: No  edema; No deformity. Neuro:  Nonfocal  Psych: Normal affect   Labs    High Sensitivity Troponin:   Recent Labs  Lab 10/06/19 1604 10/06/19 2203 10/07/19 0020  TROPONINIHS 41* 38* 43*      Cardiac EnzymesNo results for input(s): TROPONINI in the last 168 hours. No results for input(s): TROPIPOC in the last 168 hours.   Chemistry Recent Labs  Lab 10/06/19 1604 10/07/19 0020 10/09/19 0719  NA 138 137 141  K 4.5 3.7 3.6  CL 105 107 105  CO2 21* 20* 24  GLUCOSE 170* 271* 147*  BUN 20 17 19   CREATININE 0.89 0.78 0.80  CALCIUM 8.7* 8.0* 8.4*  PROT 7.3  --   --   ALBUMIN 3.7  --   --   AST 46*  --   --   ALT 45*  --   --   ALKPHOS 74  --   --   BILITOT 1.2  --   --   GFRNONAA 60* >60 >60  GFRAA >60 >60 >60  ANIONGAP 12 10 12      Hematology Recent Labs  Lab 10/06/19 1604  WBC 8.6  RBC 4.40  HGB 12.7  HCT 39.5  MCV 89.8  MCH 28.9  MCHC 32.2  RDW 14.6  PLT  303    BNP Recent Labs  Lab 10/06/19 1604  BNP 240.0*     DDimer No results for input(s): DDIMER in the last 168 hours.   Radiology    No results found.  Cardiac Studies   Echo 10/09/19  1. Left ventricular ejection fraction, by visual estimation, is 45 to  50%. The left ventricle has mild to moderately decreased function. There  is no left ventricular hypertrophy.   2. Left ventricular diastolic parameters are indeterminate.   3. The left ventricle demonstrates global hypokinesis.   4. Global right ventricle has mildly reduced systolic function.The right  ventricular size is normal. Right vetricular wall thickness was not  assessed.   5. Left atrial size was moderately dilated.   6. Right atrial size was normal.   7. Large pleural effusion in the left lateral region.   8. Mild mitral annular calcification.   9. The mitral valve is degenerative. Mild mitral valve regurgitation.  10. The tricuspid valve is grossly normal.  11. The tricuspid valve is grossly normal. Tricuspid valve  regurgitation  is mild-moderate.  12. The aortic valve is grossly normal. Aortic valve regurgitation is not  visualized.  13. Pulmonic regurgitation is mild.  14. The pulmonic valve was not well visualized. Pulmonic valve  regurgitation is mild.  15. The inferior vena cava is dilated in size with <50% respiratory  variability, suggesting right atrial pressure of 15 mmHg.     Patient Profile     86 year old female with history of newly diagnosed atrial fibrillation with RVR, HFrEF (EF 45-50%), hypertension, DM2, and seen today for A. fib with RVR.  Assessment & Plan    Newly diagnosed atrial fibrillation with rapid ventricular rate --Remains in A. fib with RVR.  Rate control has been complicated by softer pressures today with her morning BB held.   --Discontinued diltiazem, given mildly reduced EF. --Changed frequency of po metoprolol tartrate 25 mg to every 6h with hold parameters for more optimal titration / HR control as BP allows. --Started digoxin 0.25 today 10/09/19 with digoxin 0.125 daily to start tomorrow 10/10/2019. Consider digoxin level check on Thursday. --Continue to monitor renal function and electrolytes.  Earlier K 3.6. She received KCl 27mEq earlier today.  Recheck of BMET and magnesium tomorrow. --Continue Evergreen with Eliquis 5mg  BID. CHA2DS2VASc score of at least 6 (CHF, HTN, agex2, DM2, female) with recommendation for long-term anticoagulation. --If able to control ventricular rate, will plan for outpatient cardioversion after 3 to 4 weeks of anticoagulation. As previously mentioned, if ventricular rate remains difficult to control, TEE guarded cardioversion might be needed this admission / later this week.  Mildly elevated troponin --No chest pain.  Mildly elevated troponin thought likely 2/2 supply demand ischemia in the setting of her rapid ventricular rate and pulmonary congestion.  EKG without acute ST or T changes.  Not consistent with ACS.  Given her mildly reduced EF  and risk factors for cardiac etiology, cannot completely exclude CAD recommend consideration of outpatient Lexiscan Myoview at discharge for further ischemic work-up.   Cardiomyopathy with mildly reduced EF 45 to 50% --No shortness of breath though did report an earlier episode of dizziness.  Mildly reduced EF on above echocardiogram.  Consider tachycardia induced cardiomyopathy.  As above, cannot completely rule out ischemia and recommend outpatient [facility] discharge.  Lasix 40 mg daily was added 2/1 for pulmonary congestion.  Appears relatively euvolemic today; therefore, could consider holding her low-dose Lasix in the setting of lower pressures  and as suspect she is approaching her baseline volume status. Cr increased overnight from 0.78  0.80 with BUN 17  19.  -2.7 L for the admission with -650 cc yesterday.  Weight 67.5 kg  66.2kg with initial wt 65.3kg.  Continue daily BMET to monitor renal function electrolytes.  Current blood pressure precludes addition of ACE or ARB.   For questions or updates, please contact [place] Please consult [email or web address] for contact info under        Signed, Arvil Chaco, PA-C  10/09/2019, 3:53 PM

## 2019-10-09 NOTE — Progress Notes (Addendum)
Pt's HR elevated afib with RVR 130-150's. Jaqueline, PA on the floor and notified. MEWS score of 2.S. Per PA will start pt on dig. Will give and continue to monitor.

## 2019-10-09 NOTE — Progress Notes (Addendum)
Per Dr. END okay to hold cardizem, BP 108/92 HR 62.

## 2019-10-10 DIAGNOSIS — I428 Other cardiomyopathies: Secondary | ICD-10-CM

## 2019-10-10 LAB — GLUCOSE, CAPILLARY
Glucose-Capillary: 150 mg/dL — ABNORMAL HIGH (ref 70–99)
Glucose-Capillary: 180 mg/dL — ABNORMAL HIGH (ref 70–99)
Glucose-Capillary: 167 mg/dL — ABNORMAL HIGH (ref 70–99)

## 2019-10-10 MED ORDER — DIGOXIN 250 MCG PO TABS
0.2500 mg | ORAL_TABLET | Freq: Four times a day (QID) | ORAL | Status: AC
  Administered 2019-10-10 – 2019-10-11 (×3): 0.25 mg via ORAL
  Filled 2019-10-10 (×3): qty 1

## 2019-10-10 NOTE — Progress Notes (Signed)
Progress Note  Patient Name: Vanessa Gentry Date of Encounter: 10/10/2019  Primary Cardiologist: New- Agbor-Etang  Subjective   Denies chest pain, shortness of breath, palpitations.  Heart rate still elevated at 140s to 150s.  Inpatient Medications    Scheduled Meds: . apixaban  5 mg Oral BID  . digoxin  0.125 mg Oral Daily  . digoxin  0.25 mg Oral Q6H  . furosemide  40 mg Oral Daily  . insulin aspart  0-15 Units Subcutaneous TID WC  . mouth rinse  15 mL Mouth Rinse BID  . metoprolol tartrate  25 mg Oral Q6H  . sodium chloride flush  10 mL Intravenous Q12H   Continuous Infusions:  PRN Meds: acetaminophen, metoprolol tartrate, ondansetron (ZOFRAN) IV   Vital Signs    Vitals:   10/10/19 0744 10/10/19 0813 10/10/19 0819 10/10/19 0822  BP: 108/78 126/85 121/90 (!) 112/92  Pulse: (!) 135 (!) 140 98 (!) 105  Resp: 18     Temp: 98 F (36.7 C)     TempSrc: Oral     SpO2: 97%     Weight:      Height:        Intake/Output Summary (Last 24 hours) at 10/10/2019 1349 Last data filed at 10/10/2019 1300 Gross per 24 hour  Intake 480 ml  Output 1100 ml  Net -620 ml   Last 3 Weights 10/10/2019 10/09/2019 10/08/2019  Weight (lbs) 148 lb 13 oz 145 lb 14.4 oz 148 lb 12.8 oz  Weight (kg) 67.5 kg 66.18 kg 67.495 kg      Telemetry    Atrial fibrillation, heart rate 150- Personally Reviewed  ECG    No new ECG obtained today- Personally Reviewed  Physical Exam   GEN: No acute distress.   Neck: No JVD Cardiac:  Irregular irregular, tachycardic, no murmurs, rubs, or gallops.  Respiratory: Clear to auscultation bilaterally. GI: Soft, nontender, non-distended  MS: No edema; No deformity. Neuro:  Nonfocal  Psych: Normal affect   Labs    High Sensitivity Troponin:   Recent Labs  Lab 10/06/19 1604 10/06/19 2203 10/07/19 0020  TROPONINIHS 41* 38* 43*      Chemistry Recent Labs  Lab 10/06/19 1604 10/07/19 0020 10/09/19 0719  NA 138 137 141  K 4.5 3.7 3.6  CL 105  107 105  CO2 21* 20* 24  GLUCOSE 170* 271* 147*  BUN 20 17 19   CREATININE 0.89 0.78 0.80  CALCIUM 8.7* 8.0* 8.4*  PROT 7.3  --   --   ALBUMIN 3.7  --   --   AST 46*  --   --   ALT 45*  --   --   ALKPHOS 74  --   --   BILITOT 1.2  --   --   GFRNONAA 60* >60 >60  GFRAA >60 >60 >60  ANIONGAP 12 10 12      Hematology Recent Labs  Lab 10/06/19 1604  WBC 8.6  RBC 4.40  HGB 12.7  HCT 39.5  MCV 89.8  MCH 28.9  MCHC 32.2  RDW 14.6  PLT 303    BNP Recent Labs  Lab 10/06/19 1604  BNP 240.0*     DDimer No results for input(s): DDIMER in the last 168 hours.   Radiology    No results found.  Cardiac Studies   TTE 10/07/2019 1. Left ventricular ejection fraction, by visual estimation, is 45 to  50%. The left ventricle has mild to moderately decreased function. There  is no left ventricular hypertrophy.  2. Left ventricular diastolic parameters are indeterminate.  3. The left ventricle demonstrates global hypokinesis.  4. Global right ventricle has mildly reduced systolic function.The right  ventricular size is normal. Right vetricular wall thickness was not  assessed.  5. Left atrial size was moderately dilated.  6. Right atrial size was normal.  7. Large pleural effusion in the left lateral region.  8. Mild mitral annular calcification.  9. The mitral valve is degenerative. Mild mitral valve regurgitation.  10. The tricuspid valve is grossly normal.  11. The tricuspid valve is grossly normal. Tricuspid valve regurgitation  is mild-moderate.  12. The aortic valve is grossly normal. Aortic valve regurgitation is not  visualized.  13. Pulmonic regurgitation is mild.  14. The pulmonic valve was not well visualized. Pulmonic valve  regurgitation is mild.  15. The inferior vena cava is dilated in size with <50% respiratory  variability, suggesting right atrial pressure of 15 mmHg.   Patient Profile     86 y.o. female with history of hypertension, diabetes,  new onset atrial fibrillation with RVR, mildly reduced EF of 45 to 50% being seen for atrial fibrillation rapid ventricular response  Assessment & Plan    1. A. fib RVR -Heart rates still not controlled -Low normal blood pressure preventing the use of Cardizem. -Original plan was for TEE cardioversion tomorrow but patient declined any procedures due to her brother and a family friend who had poor response after cardioversion.  She will only take medications. -Continue Lopressor 25 mg every 6 hours -We will plan for dig load of 0.25 mg every 6 hours x 3 doses. -Check dig levels in the a.m. -Amiodarone may be another option if digoxin does not work with the caveat being patient may spontaneously/pharmacologically cardiovert and we are not sure as to when patient went into atrial fibrillation or if there is a thrombus.  2.  Mildly reduced ejection fraction. -He is euvolemic -Ischemic work-up can be done as outpatient or repeat limited echo when heart rate is better controlled.  As A. fib with rapid ventricular response could cause EF to be erroneously low. -Continue Lopressor as above -BP low normal preventing use of ACE/ARB.    Signed, Kate Sable, MD  10/10/2019, 1:49 PM

## 2019-10-10 NOTE — Progress Notes (Signed)
PROGRESS NOTE    Vanessa Gentry  [id number] DOB: [birth year] DOA: 10/06/2019 PCP: Ronnell Freshwater, NP    Brief Narrative:  Vanessa Gentry is a 86 y.o. female with medical history significant of arthritis, diabetes, gastric reflux, hypertension, presents to the emergency department for shortness of breath. According to the patient for the past 1 week or so she has intermittently been feeling short of breath, worse with exertion. States she has had a dry cough as well. Denies any sputum production. Denies any fever. Patient states chest tightness at times but denies any pain.  1/31: Patient seen and examined.  EKG reviewed per cardiology.  Current rhythm atrial flutter.  Patient currently on diltiazem infusion at 15 cc/h.  Rate high 90s majority of the time however does not increase to 130s to 140s.  Patient overall stable.  Chest pain-free.  Case discussed with cardiology.  Recommending aggressive oral regimen with Cardizem p.o. as well as metoprolol p.o. with attempts to titrate off Cardizem infusion.  2/1: Patient seen and examined.  Diltiazem drip titrated off.  Overall heart rate improved however patient continues to have transient elevations up to 130s and 140s.  Patient overall stable.  Chest pain-free.  Case discussed with cardiology.  Will escalate oral regimen in effort to control heart rate  2/2: Patient seen and examined.  Remained relatively stable on p.o. Cardizem and p.o. metoprolol.  Chest pain-free.  Patient continues to have some exertional dyspnea and cough.  2/3: patient is anxious and wanting to go home but HR fluctuates 100-150s at rest. Refusing any procedure  Assessment & Plan:   Active Problems:   Uncontrolled type 2 diabetes mellitus with hyperglycemia (HCC)   Essential hypertension   Atrial fibrillation with rapid ventricular response (HCC)   Dyspnea   Cardiomyopathy ([place])  Atrial fibrillation/flutter with rapid ventricular response - Heart rates  100-150s at rest -Low normal blood pressure preventing the use of Cardizem. -Original plan was for TEE cardioversion tomorrow but patient declines  -Continue Lopressor 25 mg every 6 hours -cardio ordered dig load of 0.25 mg every 6 hours x 3 doses. -Check dig levels in the a.m. -Appreciate cardio input  Multifocal pneumonia, ruled out Unclear etiology of infiltrate noted on chest x-ray Clinically the patient is afebrile and does not appear septic or toxic Respiratory procalcitonin ordered-negative No indication for antibiotics  Diabetes mellitus type 2 Last hemoglobin A1c 6.2 Home Metformin held Sliding scale coverage Carb controlled diet  Hypertension Hold home ARB Cardizem as above Metoprolol as above  DVT prophylaxis: Eliquis Code Status: Full Family Communication: Son Bernelle Mehring via phone, [phone] on 2/2 Disposition Plan: Home with home health, anticipate 24 to 48 hours   Consultants:   Cardiology-[facility]  Procedures:   None  Antimicrobials:   None   Subjective: Patient seen and examined HR remains elevated  Objective: Vitals:   10/10/19 1507 10/10/19 1513 10/10/19 1521 10/10/19 1959  BP:  112/68 112/68 108/82  Pulse:  72 72 (!) 54  Resp: 20 18  18   Temp: 98.3 F (36.8 C)   98.2 F (36.8 C)  TempSrc:    Oral  SpO2: 95% 95%  96%  Weight:      Height:        Intake/Output Summary (Last 24 hours) at 10/10/2019 2045 Last data filed at 10/10/2019 1742 Gross per 24 hour  Intake 720 ml  Output 1050 ml  Net -330 ml   Filed Weights   10/08/19 0422 10/09/19 0451 10/10/19  0353  Weight: 67.5 kg 66.2 kg 67.5 kg    Examination:  General exam: Appears calm and comfortable  Respiratory system: Clear to auscultation. Respiratory effort normal. Cardiovascular system: Normal rate, irregular rhythm, no murmur gastrointestinal system: Abdomen is nondistended, soft and nontender. No organomegaly or masses felt. Normal bowel sounds heard. Central  nervous system: Alert and oriented. No focal neurological deficits. Extremities: Symmetric 5 x 5 power. Skin: No rashes, lesions or ulcers Psychiatry: Judgement and insight appear normal. Mood & affect appropriate.     Data Reviewed: I have personally reviewed following labs and imaging studies  CBC: Recent Labs  Lab 10/06/19 1604  WBC 8.6  NEUTROABS 6.6  HGB 12.7  HCT 39.5  MCV 89.8  PLT [id number]   Basic Metabolic Panel: Recent Labs  Lab 10/06/19 1604 10/07/19 0020 10/09/19 0719  NA 138 137 141  K 4.5 3.7 3.6  CL 105 107 105  CO2 21* 20* 24  GLUCOSE 170* 271* 147*  BUN 20 17 19   CREATININE 0.89 0.78 0.80  CALCIUM 8.7* 8.0* 8.4*   GFR: Estimated Creatinine Clearance: 47.2 mL/min (by C-G formula based on SCr of 0.8 mg/dL). Liver Function Tests: Recent Labs  Lab 10/06/19 1604  AST 46*  ALT 45*  ALKPHOS 74  BILITOT 1.2  PROT 7.3  ALBUMIN 3.7   No results for input(s): LIPASE, AMYLASE in the last 168 hours. No results for input(s): AMMONIA in the last 168 hours. Coagulation Profile: No results for input(s): INR, PROTIME in the last 168 hours. Cardiac Enzymes: No results for input(s): CKTOTAL, CKMB, CKMBINDEX, TROPONINI in the last 168 hours. BNP (last 3 results) No results for input(s): PROBNP in the last 8760 hours. HbA1C: No results for input(s): HGBA1C in the last 72 hours. CBG: Recent Labs  Lab 10/09/19 1644 10/09/19 2113 10/10/19 0746 10/10/19 1133 10/10/19 1642  GLUCAP 130* 124* 150* 180* 167*   Lipid Profile: No results for input(s): CHOL, HDL, LDLCALC, TRIG, CHOLHDL, LDLDIRECT in the last 72 hours. Thyroid Function Tests: No results for input(s): TSH, T4TOTAL, FREET4, T3FREE, THYROIDAB in the last 72 hours. Anemia Panel: No results for input(s): VITAMINB12, FOLATE, FERRITIN, TIBC, IRON, RETICCTPCT in the last 72 hours. Sepsis Labs: Recent Labs  Lab 10/07/19 1405  PROCALCITON <0.10    Recent Results (from the past 240 hour(s))    Respiratory Panel by RT PCR (Flu A&B, Covid) - Nasopharyngeal Swab     Status: None   Collection Time: 10/06/19  6:12 PM   Specimen: Nasopharyngeal Swab  Result Value Ref Range Status   SARS Coronavirus 2 by RT PCR NEGATIVE NEGATIVE Final    Comment: (NOTE) SARS-CoV-2 target nucleic acids are NOT DETECTED. The SARS-CoV-2 RNA is generally detectable in upper respiratoy specimens during the acute phase of infection. The lowest concentration of SARS-CoV-2 viral copies this assay can detect is 131 copies/mL. A negative result does not preclude SARS-Cov-2 infection and should not be used as the sole basis for treatment or other patient management decisions. A negative result may occur with  improper specimen collection/handling, submission of specimen other than nasopharyngeal swab, presence of viral mutation(s) within the areas targeted by this assay, and inadequate number of viral copies (<131 copies/mL). A negative result must be combined with clinical observations, patient history, and epidemiological information. The expected result is Negative. Fact Sheet for Patients:  PinkCheek.be Fact Sheet for Healthcare Providers:  GravelBags.it This test is not yet ap proved or cleared by the [place] FDA and  has  been authorized for detection and/or diagnosis of SARS-CoV-2 by FDA under an Emergency Use Authorization (EUA). This EUA will remain  in effect (meaning this test can be used) for the duration of the COVID-19 declaration under Section 564(b)(1) of the Act, 21 U.S.C. section 360bbb-3(b)(1), unless the authorization is terminated or revoked sooner.    Influenza A by PCR NEGATIVE NEGATIVE Final   Influenza B by PCR NEGATIVE NEGATIVE Final    Comment: (NOTE) The Xpert Xpress SARS-CoV-2/FLU/RSV assay is intended as an aid in  the diagnosis of influenza from Nasopharyngeal swab specimens and  should not be used as a sole  basis for treatment. Nasal washings and  aspirates are unacceptable for Xpert Xpress SARS-CoV-2/FLU/RSV  testing. Fact Sheet for Patients: PinkCheek.be Fact Sheet for Healthcare Providers: GravelBags.it This test is not yet approved or cleared by the [place] FDA and  has been authorized for detection and/or diagnosis of SARS-CoV-2 by  FDA under an Emergency Use Authorization (EUA). This EUA will remain  in effect (meaning this test can be used) for the duration of the  Covid-19 declaration under Section 564(b)(1) of the Act, 21  U.S.C. section 360bbb-3(b)(1), unless the authorization is  terminated or revoked. Performed at [facility], [address], [place], [place] [address]          Radiology Studies: No results found.      Scheduled Meds: . apixaban  5 mg Oral BID  . digoxin  0.125 mg Oral Daily  . digoxin  0.25 mg Oral Q6H  . furosemide  40 mg Oral Daily  . insulin aspart  0-15 Units Subcutaneous TID WC  . mouth rinse  15 mL Mouth Rinse BID  . metoprolol tartrate  25 mg Oral Q6H  . sodium chloride flush  10 mL Intravenous Q12H   Continuous Infusions:    LOS: 4 days    Time spent: 35 minutes    Max Sane, MD [facility]  If 7PM-7AM, please contact night-coverage 10/10/2019, 8:45 PM

## 2019-10-10 NOTE — TOC Progression Note (Signed)
Transition of Care [facility]) - Progression Note    Patient Details  Name: Vanessa Gentry MRN: [id number] Date of Birth: 1933/04/05  [facility] CM/SW [place], RN Phone Number: 10/10/2019, 2:24 PM  Clinical Narrative:    Met with patient today and discussed [place] [facility] with her.  She is agreeable to work with [facility].  Will continue to follow for further needs.   Expected Discharge Plan: Home/Self Care Barriers to Discharge: Continued Medical Work up  Expected Discharge Plan and Services Expected Discharge Plan: Home/Self Care   Discharge Planning Services: CM Consult   Living arrangements for the past 2 months: Single Family Home                 DME Arranged: N/A DME Agency: NA       HH Arranged: PT [place] Agency: [place] ([place]) Date [place]: 10/09/19 Time [place]: [place] Representative spoke with at [place]: [place] ([place]) Interventions    Readmission Risk Interventions No flowsheet data found.

## 2019-10-10 NOTE — Progress Notes (Signed)
Patients HR (afib) elevated up into the 150's this morning. Gave PRN IV metoprolol. Patient is asymptomatic. MD notified. Will continue on digoxin and metoprolol. Yellow MEWS score of 3.

## 2019-10-10 NOTE — Progress Notes (Signed)
PT Cancellation Note  Patient Details Name: MAIREN BOHAN MRN: [id number] DOB: [birth year]   Cancelled Treatment:    Reason Eval/Treat Not Completed: Medical issues which prohibited therapy. Patient's HR is in the mid 140s at rest. Will continue to follow and see as appropriate.    Norris Bodley 10/10/2019, 9:15 AM

## 2019-10-11 DIAGNOSIS — I420 Dilated cardiomyopathy: Secondary | ICD-10-CM

## 2019-10-11 DIAGNOSIS — J90 Pleural effusion, not elsewhere classified: Secondary | ICD-10-CM

## 2019-10-11 DIAGNOSIS — E1165 Type 2 diabetes mellitus with hyperglycemia: Secondary | ICD-10-CM

## 2019-10-11 DIAGNOSIS — I4892 Unspecified atrial flutter: Secondary | ICD-10-CM

## 2019-10-11 LAB — CBC
HCT: 39.1 % (ref 36.0–46.0)
Hemoglobin: 12.8 g/dL (ref 12.0–15.0)
MCH: 28.8 pg (ref 26.0–34.0)
MCHC: 32.7 g/dL (ref 30.0–36.0)
MCV: 87.9 fL (ref 80.0–100.0)
Platelets: 334 10*3/uL (ref 150–400)
RBC: 4.45 MIL/uL (ref 3.87–5.11)
RDW: 14.4 % (ref 11.5–15.5)
WBC: 7.7 10*3/uL (ref 4.0–10.5)
nRBC: 0 % (ref 0.0–0.2)

## 2019-10-11 LAB — BASIC METABOLIC PANEL
Anion gap: 9 (ref 5–15)
BUN: 21 mg/dL (ref 8–23)
CO2: 24 mmol/L (ref 22–32)
Calcium: 8.5 mg/dL — ABNORMAL LOW (ref 8.9–10.3)
Chloride: 106 mmol/L (ref 98–111)
Creatinine, Ser: 0.77 mg/dL (ref 0.44–1.00)
GFR calc Af Amer: >60 mL/min (ref >60)
GFR calc non Af Amer: >60 mL/min (ref >60)
Glucose, Bld: 138 mg/dL — ABNORMAL HIGH (ref 70–99)
Potassium: 4.1 mmol/L (ref 3.5–5.1)
Sodium: 139 mmol/L (ref 135–145)

## 2019-10-11 LAB — PROTIME-INR
INR: 1.2 (ref 0.8–1.2)
Prothrombin Time: 15.2 seconds (ref 11.4–15.2)

## 2019-10-11 LAB — DIGOXIN LEVEL: Digoxin Level: 1.9 ng/mL (ref 0.8–2.0)

## 2019-10-11 LAB — GLUCOSE, CAPILLARY
Glucose-Capillary: 118 mg/dL — ABNORMAL HIGH (ref 70–99)
Glucose-Capillary: 148 mg/dL — ABNORMAL HIGH (ref 70–99)
Glucose-Capillary: 129 mg/dL — ABNORMAL HIGH (ref 70–99)
Glucose-Capillary: 172 mg/dL — ABNORMAL HIGH (ref 70–99)

## 2019-10-11 MED ORDER — METOPROLOL TARTRATE 50 MG PO TABS
50.0000 mg | ORAL_TABLET | Freq: Four times a day (QID) | ORAL | Status: DC
  Administered 2019-10-11 – 2019-10-12 (×5): 50 mg via ORAL
  Filled 2019-10-11 (×5): qty 1

## 2019-10-11 MED ORDER — SODIUM CHLORIDE 0.9 % IV SOLN: INTRAVENOUS | Status: DC

## 2019-10-11 NOTE — Progress Notes (Signed)
PROGRESS NOTE    Vanessa Gentry  [id number] DOB: 05-13-1933 DOA: 10/06/2019 PCP: Ronnell Freshwater, NP    Brief Narrative:  Vanessa Gentry is a 86 y.o. female with medical history significant of arthritis, diabetes, gastric reflux, hypertension, presents to the emergency department for shortness of breath. According to the patient for the past 1 week or so she has intermittently been feeling short of breath, worse with exertion. States she has had a dry cough as well. Denies any sputum production. Denies any fever. Patient states chest tightness at times but denies any pain.  1/31: Patient seen and examined.  EKG reviewed per cardiology.  Current rhythm atrial flutter.  Patient currently on diltiazem infusion at 15 cc/h.  Rate high 90s majority of the time however does not increase to 130s to 140s.  Patient overall stable.  Chest pain-free.  Case discussed with cardiology.  Recommending aggressive oral regimen with Cardizem p.o. as well as metoprolol p.o. with attempts to titrate off Cardizem infusion.  2/1: Patient seen and examined.  Diltiazem drip titrated off.  Overall heart rate improved however patient continues to have transient elevations up to 130s and 140s.  Patient overall stable.  Chest pain-free.  Case discussed with cardiology.  Will escalate oral regimen in effort to control heart rate  2/2: Patient seen and examined.  Remained relatively stable on p.o. Cardizem and p.o. metoprolol.  Chest pain-free.  Patient continues to have some exertional dyspnea and cough.  2/3: patient is anxious and wanting to go home but HR fluctuates 100-150s at rest. Refusing any procedure  2/4: much calm today, HR still in 120s, agreeable with procedure if required now  Assessment & Plan:   Active Problems:   Uncontrolled type 2 diabetes mellitus with hyperglycemia (HCC)   Essential hypertension   Atrial fibrillation with rapid ventricular response (HCC)   Dyspnea   Cardiomyopathy  ([place])  Atrial fibrillation/flutter with rapid ventricular response - still uncontrolled. continue metoprolol 50 mg QID, on clonidine -On Eliquis 5 twice daily --cardio planning cardioversion tomorrow  Multifocal pneumonia, ruled out Unclear etiology of infiltrate noted on chest x-ray Clinically the patient is afebrile and does not appear septic or toxic Respiratory procalcitonin ordered-negative No indication for antibiotics  Diabetes mellitus type 2 Last hemoglobin A1c 6.2 Home Metformin held Sliding scale coverage Carb controlled diet  Hypertension metoprolol  DVT prophylaxis: Eliquis Code Status: Full Family Communication: Son Ramesha Frisque via phone, [phone] on 2/4 Disposition Plan: Home with home health, in 2-3 days depending on cardiac w/up and HR control   Consultants:   Cardiology-[facility]  Procedures:   None  Antimicrobials:   None   Subjective: Patient seen and examined HR remains elevated, calm today. No new c/o,  Objective: Vitals:   10/11/19 1401 10/11/19 1602 10/11/19 1801 10/11/19 1944  BP: (!) 128/93 (!) 125/94 (!) 142/105 137/87  Pulse: 60 (!) 44 71 (!) 44  Resp:      Temp:    97.9 F (36.6 C)  TempSrc:    Oral  SpO2: 95% 98% 97% 94%  Weight:      Height:        Intake/Output Summary (Last 24 hours) at 10/11/2019 2054 Last data filed at 10/11/2019 2029 Gross per 24 hour  Intake 960 ml  Output 300 ml  Net 660 ml   Filed Weights   10/09/19 0451 10/10/19 0353 10/11/19 0528  Weight: 66.2 kg 67.5 kg 66.6 kg    Examination:  General exam: Appears calm  and comfortable  Respiratory system: Clear to auscultation. Respiratory effort normal. Cardiovascular system: Normal rate, irregular rhythm, no murmur gastrointestinal system: Abdomen is nondistended, soft and nontender. No organomegaly or masses felt. Normal bowel sounds heard. Central nervous system: Alert and oriented. No focal neurological deficits. Extremities: Symmetric 5 x 5  power. Skin: No rashes, lesions or ulcers Psychiatry: Judgement and insight appear normal. Mood & affect appropriate.     Data Reviewed: I have personally reviewed following labs and imaging studies  CBC: Recent Labs  Lab 10/06/19 1604 10/11/19 0452  WBC 8.6 7.7  NEUTROABS 6.6  --   HGB 12.7 12.8  HCT 39.5 39.1  MCV 89.8 87.9  PLT 303 [id number]   Basic Metabolic Panel: Recent Labs  Lab 10/06/19 1604 10/07/19 0020 10/09/19 0719 10/11/19 0452  NA 138 137 141 139  K 4.5 3.7 3.6 4.1  CL 105 107 105 106  CO2 21* 20* 24 24  GLUCOSE 170* 271* 147* 138*  BUN 20 17 19 21   CREATININE 0.89 0.78 0.80 0.77  CALCIUM 8.7* 8.0* 8.4* 8.5*   GFR: Estimated Creatinine Clearance: 46.9 mL/min (by C-G formula based on SCr of 0.77 mg/dL). Liver Function Tests: Recent Labs  Lab 10/06/19 1604  AST 46*  ALT 45*  ALKPHOS 74  BILITOT 1.2  PROT 7.3  ALBUMIN 3.7   No results for input(s): LIPASE, AMYLASE in the last 168 hours. No results for input(s): AMMONIA in the last 168 hours. Coagulation Profile: Recent Labs  Lab 10/11/19 1844  INR 1.2   Cardiac Enzymes: No results for input(s): CKTOTAL, CKMB, CKMBINDEX, TROPONINI in the last 168 hours. BNP (last 3 results) No results for input(s): PROBNP in the last 8760 hours. HbA1C: No results for input(s): HGBA1C in the last 72 hours. CBG: Recent Labs  Lab 10/10/19 1133 10/10/19 1642 10/11/19 0829 10/11/19 1311 10/11/19 1629  GLUCAP 180* 167* 118* 148* 129*   Lipid Profile: No results for input(s): CHOL, HDL, LDLCALC, TRIG, CHOLHDL, LDLDIRECT in the last 72 hours. Thyroid Function Tests: No results for input(s): TSH, T4TOTAL, FREET4, T3FREE, THYROIDAB in the last 72 hours. Anemia Panel: No results for input(s): VITAMINB12, FOLATE, FERRITIN, TIBC, IRON, RETICCTPCT in the last 72 hours. Sepsis Labs: Recent Labs  Lab 10/07/19 1405  PROCALCITON <0.10    Recent Results (from the past 240 hour(s))  Respiratory Panel by RT PCR  (Flu A&B, Covid) - Nasopharyngeal Swab     Status: None   Collection Time: 10/06/19  6:12 PM   Specimen: Nasopharyngeal Swab  Result Value Ref Range Status   SARS Coronavirus 2 by RT PCR NEGATIVE NEGATIVE Final    Comment: (NOTE) SARS-CoV-2 target nucleic acids are NOT DETECTED. The SARS-CoV-2 RNA is generally detectable in upper respiratoy specimens during the acute phase of infection. The lowest concentration of SARS-CoV-2 viral copies this assay can detect is 131 copies/mL. A negative result does not preclude SARS-Cov-2 infection and should not be used as the sole basis for treatment or other patient management decisions. A negative result may occur with  improper specimen collection/handling, submission of specimen other than nasopharyngeal swab, presence of viral mutation(s) within the areas targeted by this assay, and inadequate number of viral copies (<131 copies/mL). A negative result must be combined with clinical observations, patient history, and epidemiological information. The expected result is Negative. Fact Sheet for Patients:  PinkCheek.be Fact Sheet for Healthcare Providers:  GravelBags.it This test is not yet ap proved or cleared by the [place] and  has been authorized for detection and/or diagnosis of SARS-CoV-2 by FDA under an Emergency Use Authorization (EUA). This EUA will remain  in effect (meaning this test can be used) for the duration of the COVID-19 declaration under Section 564(b)(1) of the Act, 21 U.S.C. section 360bbb-3(b)(1), unless the authorization is terminated or revoked sooner.    Influenza A by PCR NEGATIVE NEGATIVE Final   Influenza B by PCR NEGATIVE NEGATIVE Final    Comment: (NOTE) The Xpert Xpress SARS-CoV-2/FLU/RSV assay is intended as an aid in  the diagnosis of influenza from Nasopharyngeal swab specimens and  should not be used as a sole basis for treatment. Nasal  washings and  aspirates are unacceptable for Xpert Xpress SARS-CoV-2/FLU/RSV  testing. Fact Sheet for Patients: PinkCheek.be Fact Sheet for Healthcare Providers: GravelBags.it This test is not yet approved or cleared by the [place] FDA and  has been authorized for detection and/or diagnosis of SARS-CoV-2 by  FDA under an Emergency Use Authorization (EUA). This EUA will remain  in effect (meaning this test can be used) for the duration of the  Covid-19 declaration under Section 564(b)(1) of the Act, 21  U.S.C. section 360bbb-3(b)(1), unless the authorization is  terminated or revoked. Performed at [facility], [address], [place], [place] [address]          Radiology Studies: No results found.      Scheduled Meds: . apixaban  5 mg Oral BID  . digoxin  0.125 mg Oral Daily  . furosemide  40 mg Oral Daily  . insulin aspart  0-15 Units Subcutaneous TID WC  . mouth rinse  15 mL Mouth Rinse BID  . metoprolol tartrate  50 mg Oral Q6H  . sodium chloride flush  10 mL Intravenous Q12H   Continuous Infusions: . [START ON 10/12/2019] sodium chloride       LOS: 5 days    Time spent: 35 minutes    Max Sane, MD [facility]  If 7PM-7AM, please contact night-coverage 10/11/2019, 8:54 PM

## 2019-10-11 NOTE — Progress Notes (Signed)
Progress Note  Patient Name: Vanessa Gentry Date of Encounter: 10/11/2019  Primary Cardiologist: New- Agbor-Etang  Subjective   Having some periods of shortness of breath even at rest Shortness of breath walking around the room Received several doses of digoxin, metoprolol with no improvement in heart rate At best was 120-130 while sleeping 140 while talking with me on exam this morning Nurses report peak heart rate 170  Inpatient Medications    Scheduled Meds: . apixaban  5 mg Oral BID  . digoxin  0.125 mg Oral Daily  . furosemide  40 mg Oral Daily  . insulin aspart  0-15 Units Subcutaneous TID WC  . mouth rinse  15 mL Mouth Rinse BID  . metoprolol tartrate  50 mg Oral Q6H  . sodium chloride flush  10 mL Intravenous Q12H   Continuous Infusions:  PRN Meds: acetaminophen, metoprolol tartrate, ondansetron (ZOFRAN) IV   Vital Signs    Vitals:   10/11/19 0528 10/11/19 0828 10/11/19 1009 10/11/19 1119  BP: (!) 124/96 115/77  116/83  Pulse: 65 (!) 107 (!) 109 (!) 156  Resp: 17 16  16   Temp: 97.8 F (36.6 C) 98 F (36.7 C)  (!) 97.4 F (36.3 C)  TempSrc:  Oral  Oral  SpO2: 98% 99%  94%  Weight: 66.6 kg     Height:        Intake/Output Summary (Last 24 hours) at 10/11/2019 1518 Last data filed at 10/11/2019 1300 Gross per 24 hour  Intake 960 ml  Output 0 ml  Net 960 ml   Last 3 Weights 10/11/2019 10/10/2019 10/09/2019  Weight (lbs) 146 lb 13.2 oz 148 lb 13 oz 145 lb 14.4 oz  Weight (kg) 66.6 kg 67.5 kg 66.18 kg      Telemetry    Atrial fibrillation, heart rate 140, variable- Personally Reviewed  ECG    No new ECG obtained today- Personally Reviewed  Physical Exam   Constitutional:  oriented to person, place, and time. No distress.  HENT:  Head: Grossly normal Eyes:  no discharge. No scleral icterus.  Neck: JVD 8+, no carotid bruits  Cardiovascular: Atrial fibrillation with RVR no murmurs appreciated Pulmonary/Chest: Clear to auscultation bilaterally, no  wheezes or rails Abdominal: Soft.  no distension.  no tenderness.  Musculoskeletal: Normal range of motion Neurological:  normal muscle tone. Coordination normal. No atrophy Skin: Skin warm and dry Psychiatric: normal affect, pleasant   Labs    High Sensitivity Troponin:   Recent Labs  Lab 10/06/19 1604 10/06/19 2203 10/07/19 0020  TROPONINIHS 41* 38* 43*      Chemistry Recent Labs  Lab 10/06/19 1604 10/06/19 1604 10/07/19 0020 10/09/19 0719 10/11/19 0452  NA 138   < > 137 141 139  K 4.5   < > 3.7 3.6 4.1  CL 105   < > 107 105 106  CO2 21*   < > 20* 24 24  GLUCOSE 170*   < > 271* 147* 138*  BUN 20   < > 17 19 21   CREATININE 0.89   < > 0.78 0.80 0.77  CALCIUM 8.7*   < > 8.0* 8.4* 8.5*  PROT 7.3  --   --   --   --   ALBUMIN 3.7  --   --   --   --   AST 46*  --   --   --   --   ALT 45*  --   --   --   --  ALKPHOS 74  --   --   --   --   BILITOT 1.2  --   --   --   --   GFRNONAA 60*   < > >60 >60 >60  GFRAA >60   < > >60 >60 >60  ANIONGAP 12   < > 10 12 9    < > = values in this interval not displayed.     Hematology Recent Labs  Lab 10/06/19 1604 10/11/19 0452  WBC 8.6 7.7  RBC 4.40 4.45  HGB 12.7 12.8  HCT 39.5 39.1  MCV 89.8 87.9  MCH 28.9 28.8  MCHC 32.2 32.7  RDW 14.6 14.4  PLT 303 334    BNP Recent Labs  Lab 10/06/19 1604  BNP 240.0*     DDimer No results for input(s): DDIMER in the last 168 hours.   Radiology    No results found.  Cardiac Studies   TTE 10/07/2019 1. Left ventricular ejection fraction, by visual estimation, is 45 to  50%. The left ventricle has mild to moderately decreased function. There  is no left ventricular hypertrophy.  2. Left ventricular diastolic parameters are indeterminate.  3. The left ventricle demonstrates global hypokinesis.  4. Global right ventricle has mildly reduced systolic function.The right  ventricular size is normal. Right vetricular wall thickness was not  assessed.  5. Left atrial  size was moderately dilated.  6. Right atrial size was normal.  7. Large pleural effusion in the left lateral region.    Patient Profile     86 y.o. female with history of hypertension, diabetes, new onset atrial fibrillation with RVR, mildly reduced EF of 45 to 50% being seen for atrial fibrillation rapid ventricular response  Assessment & Plan    1. A. Fib/flutter RVR --Difficult to control rate despite digoxin loading, metoprolol Soft blood pressure limiting addition of other rate controlling agents We will try metoprolol tartrate 50 every 6 -On Eliquis 5 twice daily --Long discussion with her concerning various treatment options which are limited: Including continued attempts for rate control versus TEE cardioversion --After long discussion concerning risk-benefit of the procedure, she is willing to proceed with TEE cardioversion tomorrow morning if unable to control rate through the course of the day today --I personally called specials, anesthesia, placed on the schedule for tomorrow morning -Orders placed, discussed with nursing  2.  Mildly reduced ejection fraction. Suspect tachycardia mediated Continue Lopressor as above, holding other medications such as ACE inhibitor or ARB given low blood pressure -Plan is to restore normal sinus rhythm  3 pleural effusion Secondary to atrial flutter/flutter with RVR Causing systolic and diastolic CHF/pulmonary edema Improved symptoms on Lasix daily   Total encounter time more than 35 minutes  Greater than 50% was spent in counseling and coordination of care with the patient     Signed, Ida Rogue, MD  10/11/2019, 3:18 PM

## 2019-10-11 NOTE — Plan of Care (Signed)
  Problem: Education: Goal: Knowledge of General Education information will improve Description Including pain rating scale, medication(s)/side effects and non-pharmacologic comfort measures Outcome: Progressing   

## 2019-10-11 NOTE — Progress Notes (Signed)
PT Cancellation Note  Patient Details Name: Vanessa Gentry MRN: [id number] DOB: 05-27-1933   Cancelled Treatment:     Chart reviewed/  Monitored HR on telemonitor for a few moments before attempt.  HR fluctuating 120's to high of 146 at rest in bed.  Will hold session at this time and continue as appropriate.   Chesley Noon 10/11/2019, 9:14 AM

## 2019-10-11 NOTE — Plan of Care (Signed)
  Problem: Education: Goal: Knowledge of General Education information will improve Description: Including pain rating scale, medication(s)/side effects and non-pharmacologic comfort measures Outcome: Progressing   Problem: Health Behavior/Discharge Planning: Goal: Ability to manage health-related needs will improve Outcome: Not Progressing Note: Patient still very tachycardic today. For a cardioversion in the AM. Orders have been placed. Patient is agreeable. Will continue to monitor heart rate and rhythm for the remainder of the shift. Wenda Low [facility]

## 2019-10-12 ENCOUNTER — Encounter
Admission: EM | Disposition: A | Discharge status: Home Health | Payer: Self-pay | Source: Home / Self Care | Attending: Internal Medicine

## 2019-10-12 ENCOUNTER — Inpatient Hospital Stay: Payer: PPO | Admitting: Anesthesiology

## 2019-10-12 ENCOUNTER — Other Ambulatory Visit: Payer: Self-pay

## 2019-10-12 ENCOUNTER — Inpatient Hospital Stay (HOSPITAL_COMMUNITY)
Admit: 2019-10-12 | Discharge: 2019-10-12 | Disposition: A | Discharge status: Home / Self Care | Payer: PPO | Attending: Cardiovascular Disease | Admitting: Cardiovascular Disease

## 2019-10-12 ENCOUNTER — Encounter: Payer: Self-pay | Admitting: Internal Medicine

## 2019-10-12 DIAGNOSIS — I4891 Unspecified atrial fibrillation: Secondary | ICD-10-CM

## 2019-10-12 DIAGNOSIS — I700 Atherosclerosis of aorta: Secondary | ICD-10-CM

## 2019-10-12 DIAGNOSIS — R0603 Acute respiratory distress: Secondary | ICD-10-CM

## 2019-10-12 DIAGNOSIS — I4892 Unspecified atrial flutter: Secondary | ICD-10-CM

## 2019-10-12 HISTORY — PX: CARDIOVERSION: SHX1299

## 2019-10-12 HISTORY — PX: TEE WITHOUT CARDIOVERSION: SHX5443

## 2019-10-12 LAB — CBC
HCT: 42.7 % (ref 36.0–46.0)
Hemoglobin: 14 g/dL (ref 12.0–15.0)
MCH: 28.6 pg (ref 26.0–34.0)
MCHC: 32.8 g/dL (ref 30.0–36.0)
MCV: 87.1 fL (ref 80.0–100.0)
Platelets: 377 10*3/uL (ref 150–400)
RBC: 4.9 MIL/uL (ref 3.87–5.11)
RDW: 14.6 % (ref 11.5–15.5)
WBC: 9.1 10*3/uL (ref 4.0–10.5)
nRBC: 0 % (ref 0.0–0.2)

## 2019-10-12 LAB — BASIC METABOLIC PANEL
Anion gap: 11 (ref 5–15)
BUN: 21 mg/dL (ref 8–23)
CO2: 22 mmol/L (ref 22–32)
Calcium: 8.7 mg/dL — ABNORMAL LOW (ref 8.9–10.3)
Chloride: 107 mmol/L (ref 98–111)
Creatinine, Ser: 0.87 mg/dL (ref 0.44–1.00)
GFR calc Af Amer: >60 mL/min (ref >60)
GFR calc non Af Amer: >60 mL/min (ref >60)
Glucose, Bld: 156 mg/dL — ABNORMAL HIGH (ref 70–99)
Potassium: 3.9 mmol/L (ref 3.5–5.1)
Sodium: 140 mmol/L (ref 135–145)

## 2019-10-12 LAB — GLUCOSE, CAPILLARY
Glucose-Capillary: 155 mg/dL — ABNORMAL HIGH (ref 70–99)
Glucose-Capillary: 142 mg/dL — ABNORMAL HIGH (ref 70–99)
Glucose-Capillary: 171 mg/dL — ABNORMAL HIGH (ref 70–99)

## 2019-10-12 SURGERY — CARDIOVERSION
Anesthesia: General

## 2019-10-12 SURGERY — ECHOCARDIOGRAM, TRANSESOPHAGEAL
Anesthesia: General

## 2019-10-12 MED ORDER — METOPROLOL TARTRATE 100 MG PO TABS: 100.0000 mg | ORAL_TABLET | Freq: Two times a day (BID) | ORAL | 0 refills | Status: DC

## 2019-10-12 MED ORDER — LIDOCAINE HCL (CARDIAC) PF 100 MG/5ML IV SOSY
PREFILLED_SYRINGE | INTRAVENOUS | Status: DC | PRN
  Administered 2019-10-12: 30 mg via INTRATRACHEAL

## 2019-10-12 MED ORDER — LIDOCAINE VISCOUS HCL 2 % MT SOLN
OROMUCOSAL | Status: AC
  Administered 2019-10-12: 15 mL
  Filled 2019-10-12: qty 15

## 2019-10-12 MED ORDER — AMIODARONE HCL 150 MG/3ML IV SOLN
150.0000 mg | Freq: Once | INTRAVENOUS | Status: AC
  Administered 2019-10-12: 150 mg via INTRAVENOUS
  Filled 2019-10-12: qty 3

## 2019-10-12 MED ORDER — AMIODARONE HCL 150 MG/3ML IV SOLN
INTRAVENOUS | Status: AC
  Filled 2019-10-12: qty 3

## 2019-10-12 MED ORDER — AMIODARONE HCL 200 MG PO TABS: 200.0000 mg | ORAL_TABLET | Freq: Two times a day (BID) | ORAL | 0 refills | Status: DC

## 2019-10-12 MED ORDER — PHENYLEPHRINE HCL (PRESSORS) 10 MG/ML IV SOLN
INTRAVENOUS | Status: DC | PRN
  Administered 2019-10-12: 200 ug via INTRAVENOUS
  Administered 2019-10-12: 100 ug via INTRAVENOUS

## 2019-10-12 MED ORDER — BUTAMBEN-TETRACAINE-BENZOCAINE 2-2-14 % EX AERO
INHALATION_SPRAY | CUTANEOUS | Status: AC
  Administered 2019-10-12: 3
  Filled 2019-10-12: qty 5

## 2019-10-12 MED ORDER — AMIODARONE HCL 200 MG PO TABS
400.0000 mg | ORAL_TABLET | Freq: Two times a day (BID) | ORAL | Status: DC
  Administered 2019-10-12: 400 mg via ORAL
  Filled 2019-10-12: qty 2

## 2019-10-12 MED ORDER — PROPOFOL 10 MG/ML IV BOLUS
INTRAVENOUS | Status: DC | PRN
  Administered 2019-10-12: 20 mg via INTRAVENOUS
  Administered 2019-10-12: 50 mg via INTRAVENOUS
  Administered 2019-10-12: 20 mg via INTRAVENOUS

## 2019-10-12 MED ORDER — AMIODARONE HCL 400 MG PO TABS: 400.0000 mg | ORAL_TABLET | Freq: Two times a day (BID) | ORAL | 0 refills | Status: DC

## 2019-10-12 MED ORDER — AMIODARONE HCL 200 MG PO TABS: 200.0000 mg | ORAL_TABLET | Freq: Two times a day (BID) | ORAL | Status: DC

## 2019-10-12 MED ORDER — AMIODARONE IV BOLUS ONLY 150 MG/100ML
150.0000 mg | Freq: Once | INTRAVENOUS | Status: DC
  Filled 2019-10-12: qty 100

## 2019-10-12 MED ORDER — APIXABAN 5 MG PO TABS: 5.0000 mg | ORAL_TABLET | Freq: Two times a day (BID) | ORAL | 0 refills | Status: DC

## 2019-10-12 NOTE — Care Management Important Message (Signed)
Important Message  Patient Details  Name: Vanessa Gentry MRN: [id number] Date of Birth: [birth year]   Medicare Important Message Given:  Yes     [name] 10/12/2019, 11:06 AM

## 2019-10-12 NOTE — Anesthesia Preprocedure Evaluation (Addendum)
Anesthesia Evaluation  Patient identified by MRN, date of birth, ID band Patient awake    Reviewed: Allergy & Precautions, NPO status , Patient's Chart, lab work & pertinent test results  History of Anesthesia Complications Negative for: history of anesthetic complications  Airway Mallampati: II  TM Distance: >3 FB Neck ROM: Full    Dental no notable dental hx. (+) Teeth Intact   Pulmonary neg pulmonary ROS, shortness of breath, neg sleep apnea, neg COPD, Patient abstained from smoking.Not current smoker,  Dyspnea since her dysrythmia began   Pulmonary exam normal breath sounds clear to auscultation       Cardiovascular Exercise Tolerance: Good METShypertension, Pt. on medications (-) CAD and (-) Past MI negative cardio ROS  + dysrhythmias Atrial Fibrillation  Rhythm:Regular Rate:Normal - Systolic murmurs TTE [id number]: 1. Left ventricular ejection fraction, by visual estimation, is 45 to  50%. The left ventricle has mild to moderately decreased function. There  is no left ventricular hypertrophy.  2. Left ventricular diastolic parameters are indeterminate.  3. The left ventricle demonstrates global hypokinesis.  4. Global right ventricle has mildly reduced systolic function.The right  ventricular size is normal. Right vetricular wall thickness was not  assessed.  5. Left atrial size was moderately dilated.  6. Right atrial size was normal.  7. Large pleural effusion in the left lateral region.  8. Mild mitral annular calcification.  9. The mitral valve is degenerative. Mild mitral valve regurgitation.  10. The tricuspid valve is grossly normal.  11. The tricuspid valve is grossly normal. Tricuspid valve regurgitation  is mild-moderate.  12. The aortic valve is grossly normal. Aortic valve regurgitation is not  visualized.  13. Pulmonic regurgitation is mild.  14. The pulmonic valve was not well visualized. Pulmonic  valve  regurgitation is mild.  15. The inferior vena cava is dilated in size with <50% respiratory  variability, suggesting right atrial pressure of 15 mmHg.    Neuro/Psych negative neurological ROS  negative psych ROS   GI/Hepatic GERD  ,(+)     (-) substance abuse  ,   Endo/Other  diabetes  Renal/GU negative Renal ROS     Musculoskeletal  (+) Arthritis ,   Abdominal   Peds  Hematology   Anesthesia Other Findings Past Medical History: No date: Arthritis No date: Diabetes mellitus without complication (HCC) No date: Dysrhythmia No date: GERD (gastroesophageal reflux disease) No date: Hypertension No date: Pneumonia  Reproductive/Obstetrics                            Anesthesia Physical Anesthesia Plan  ASA: II  Anesthesia Plan: General   Post-op Pain Management:    Induction: Intravenous  PONV Risk Score and Plan: 3 and Ondansetron and TIVA  Airway Management Planned: Natural Airway  Additional Equipment: None  Intra-op Plan:   Post-operative Plan:   Informed Consent: I have reviewed the patients History and Physical, chart, labs and discussed the procedure including the risks, benefits and alternatives for the proposed anesthesia with the patient or authorized representative who has indicated his/her understanding and acceptance.     Dental advisory given  Plan Discussed with: CRNA and Surgeon  Anesthesia Plan Comments: (Discussed risks of anesthesia with patient, including PONV, conversion to GETA and its associated risks including sore throat, lip/dental damage. Rare risks discussed as well, such as cardiorespiratory sequelae. Patient understands.)        Anesthesia Quick Evaluation

## 2019-10-12 NOTE — Anesthesia Postprocedure Evaluation (Signed)
Anesthesia Post Note  Patient: Vanessa Gentry  Procedure(s) Performed: TRANSESOPHAGEAL ECHOCARDIOGRAM (TEE) (N/A )  Patient location during evaluation: Specials Recovery Anesthesia Type: General Level of consciousness: awake and alert Pain management: pain level controlled Vital Signs Assessment: post-procedure vital signs reviewed and stable Respiratory status: spontaneous breathing, nonlabored ventilation, respiratory function stable and patient connected to nasal cannula oxygen Cardiovascular status: blood pressure returned to baseline and stable Postop Assessment: no apparent nausea or vomiting Anesthetic complications: no     Last Vitals:  Vitals:   10/12/19 0828 10/12/19 0829  BP:    Pulse: 77 71  Resp: (!) 38 (!) 33  Temp:    SpO2: 95% 94%    Last Pain:  Vitals:   10/12/19 0748  TempSrc: Oral  PainSc: 0-No pain                 Arita Miss

## 2019-10-12 NOTE — Transfer of Care (Signed)
Immediate Anesthesia Transfer of Care Note  Patient: Vanessa Gentry  Procedure(s) Performed: TRANSESOPHAGEAL ECHOCARDIOGRAM (TEE) (N/A )  Patient Location: [facility] Recovery  Anesthesia Type:General  Level of Consciousness: drowsy  Airway & Oxygen Therapy: Patient Spontanous Breathing and Patient connected to nasal cannula oxygen  Post-op Assessment: Report given to RN and Post -op Vital signs reviewed and stable  Post vital signs: Reviewed and stable  Last Vitals:  Vitals Value Taken Time  BP 99/58 10/12/19 0840  Temp    Pulse 117 10/12/19 0841  Resp 17 10/12/19 0841  SpO2 95 % 10/12/19 0841  Vitals shown include unvalidated device data.  Last Pain:  Vitals:   10/12/19 0748  TempSrc: Oral  PainSc: 0-No pain         Complications: No apparent anesthesia complications

## 2019-10-12 NOTE — Progress Notes (Signed)
PT Cancellation Note  Patient Details Name: Vanessa Gentry MRN: [id number] DOB: 03-15-1933   Cancelled Treatment:    Reason Eval/Treat Not Completed: Patient at procedure or test/unavailable   Pt at procedure this am.  Will hold session in AM and attempt later as time allows this pm.  If we are unable to see her, will schedule her tomorrow.   Chesley Noon 10/12/2019, 10:33 AM

## 2019-10-12 NOTE — CV Procedure (Signed)
Cardioversion procedure note For atrial fibrillation with RVR.  Procedure Details:  Consent: Risks of procedure as well as the alternatives and risks of each were explained to the (patient/caregiver). Consent for procedure obtained.  Time Out: Verified patient identification, verified procedure, site/side was marked, verified correct patient position, special equipment/implants available, medications/allergies/relevent history reviewed, required imaging and test results available. Performed  Patient placed on cardiac monitor, pulse oximetry, supplemental oxygen as necessary.  Sedation given: propofol IV, Dr. Bertell Maria Pacer pads placed anterior and posterior chest.   Cardioverted 2 time(s).  Cardioverted at  150J.,recurrent fibrillation rate 150,   200 J Synchronized biphasic, recurrent afib with RVR Amiodarone 150 iv bolus, converting to NSR    Evaluation: Findings: Post procedure EKG shows: NSR Complications: None Patient did tolerate procedure well.  Time Spent Directly with the Patient:  66 minutes   Esmond Plants, M.D., Ph.D.

## 2019-10-12 NOTE — Progress Notes (Signed)
Patient has been admitted with A-fib RVR.  Pt is scheduled for cardioversion/TEE this am.  Cardiac meds are currently being adjusted.

## 2019-10-12 NOTE — TOC Transition Note (Signed)
Transition of Care [facility]) - CM/SW Discharge Note   Patient Details  Name: Vanessa Gentry MRN: [id number] Date of Birth: 1932-12-17  Transition of Care [facility]) CM/SW Contact:  Victorino Dike, RN Phone Number: 10/12/2019, 3:43 PM   Clinical Narrative:      Patient to discharge home with [facility] PT services provided by [facility].  No further TOC needs at this time, please re-consult for new needs.    Final next level of care: [place] Barriers to Discharge: Continued Medical Work up   Patient Goals and CMS Choice        Discharge Placement                       Discharge Plan and Services   Discharge Planning Services: CM Consult            DME Arranged: N/A DME Agency: NA       HH Arranged: PT [place] Agency: [place] ([place]) Date [place]: 10/09/19 Time [place]: [id number] Representative spoke with at [place]: [place] ([place]) Interventions     Readmission Risk Interventions No flowsheet data found.

## 2019-10-12 NOTE — Discharge Instructions (Signed)

## 2019-10-12 NOTE — Progress Notes (Signed)
Physical Therapy Treatment Patient Details Name: Vanessa Gentry MRN: [id number] DOB: 01/25/1933 Today's Date: 10/12/2019    History of Present Illness presented to ER secondary to chest tightness, SOB; admitted for management of afib with RVR due to acute CHF exacerbation.    PT Comments    Pt feeling good after cardioversion this am.  OK by nursing for gait.  HR remained in 60's at rest and during gait x 1 lap around unit with no AD.  Reports she is walking in room on her own to bathroom.  She reports no concerns for mobility at home. She does have 1 step into house on the side where she holds the fence for support and 3 in the front with no rails.  She stated she has been having some difficulty prior to admit with no rails and goes sideways.  She was encouraged to use side door for support of fence and to have handrails installed on the front for safety in the future and to have +1 assist when she attempts them.  Voiced understanding and stated she has been thinking of handrails on the front for some time.   Follow Up Recommendations  Home health PT     Equipment Recommendations       Recommendations for Other Services       Precautions / Restrictions Precautions Precautions: Fall Restrictions Weight Bearing Restrictions: No    Mobility  Bed Mobility Overal bed mobility: Modified Independent                Transfers Overall transfer level: Needs assistance Equipment used: None Transfers: Sit to/from Stand Sit to Stand: Supervision            Ambulation/Gait Ambulation/Gait assistance: Supervision Gait Distance (Feet): 200 Feet Assistive device: None       General Gait Details: HR stable in 60's with gait.   Stairs             Wheelchair Mobility    Modified Rankin (Stroke Patients Only)       Balance Overall balance assessment: Needs assistance Sitting-balance support: No upper extremity supported;Feet supported Sitting balance-Leahy  Scale: Good     Standing balance support: No upper extremity supported Standing balance-Leahy Scale: Fair                              Cognition Arousal/Alertness: Awake/alert Behavior During Therapy: WFL for tasks assessed/performed Overall Cognitive Status: Within Functional Limits for tasks assessed                                        Exercises      General Comments        Pertinent Vitals/Pain Pain Assessment: No/denies pain    Home Living                      Prior Function            PT Goals (current goals can now be found in the care plan section) Progress towards PT goals: Progressing toward goals    Frequency    Min 2X/week      PT Plan Current plan remains appropriate    Co-evaluation              AM-PAC PT "6 Clicks" Mobility   Outcome Measure  Help  needed turning from your back to your side while in a flat bed without using bedrails?: None Help needed moving from lying on your back to sitting on the side of a flat bed without using bedrails?: None Help needed moving to and from a bed to a chair (including a wheelchair)?: None Help needed standing up from a chair using your arms (e.g., wheelchair or bedside chair)?: None Help needed to walk in hospital room?: None Help needed climbing 3-5 steps with a railing? : A Little 6 Click Score: 23    End of Session Equipment Utilized During Treatment: Gait belt Activity Tolerance: Patient tolerated treatment well Patient left: in bed;with call bell/phone within reach Nurse Communication: Mobility status;Other (comment)       Time: [id number] PT Time Calculation (min) (ACUTE ONLY): 9 min  Charges:  $Gait Training: 8-22 mins                    Chesley Noon, PTA 10/12/19, 2:32 PM

## 2019-10-12 NOTE — Progress Notes (Signed)
Transesophageal Echocardiogram :  Indication: atrial fib with RVR Requesting/ordering  physician:   Procedure: Benzocaine spray x2 and 2 mls x 2 of viscous lidocaine were given orally to provide local anesthesia to the oropharynx. The patient was positioned supine on the left side, bite block provided. The patient was moderately sedated with the doses of versed and fentanyl as detailed below.  Using digital technique an omniplane probe was advanced into the distal esophagus without incident.   Moderate sedation:  Sedation used:  By anesthesia, propofol   See report in EPIC  for complete details: In brief, transgastric imaging revealed mildly depressedl LV function with no RWMAs and no mural apical thrombus.  .  Estimated ejection fraction was 45%. Global hypokinesis.  Right sided cardiac chambers were normal with no evidence of pulmonary hypertension.  Imaging of the septum showed no ASD or VSD 2D and color flow confirmed small  PFO  The LA was well visualized in orthogonal views.  There was  spontaneous contrast and  no thrombus in the LA and LA appendage At least mildly dilated Left atrium   The descending thoracic aorta had moderate  mural aortic debris with no evidence of aneurysmal dilation or disection  Cardioversion to follow procedure NSR restored   Vanessa Gentry 10/12/2019 8:44 AM

## 2019-10-12 NOTE — Plan of Care (Signed)
  Problem: Education: Goal: Knowledge of General Education information will improve Description: Including pain rating scale, medication(s)/side effects and non-pharmacologic comfort measures Outcome: Progressing   Problem: Clinical Measurements: Goal: Diagnostic test results will improve Outcome: Progressing   Problem: Safety: Goal: Ability to remain free from injury will improve Outcome: Progressing   Problem: Education: Goal: Understanding of medication regimen will improve Outcome: Progressing   Problem: Education: Goal: Knowledge of disease or condition will improve Outcome: Progressing   Problem: Education: Goal: Understanding of medication regimen will improve Outcome: Progressing

## 2019-10-12 NOTE — Progress Notes (Signed)
Progress Note  Patient Name: Vanessa Gentry Date of Encounter: 10/12/2019  Primary Cardiologist: New- Agbor-Etang  Subjective   Unable to control rate yesterday and overnight, Successful TEE and cardioversion this AM TEE with mildly depressed EF, likely secondary to arrhythmia. Moderate mural atherosclerosis noted descending aorta. Amiodarone IV given 150 mg IV during cardioversion to maintain NSR (had paroxysmal fib following cardioversion)   Inpatient Medications    Scheduled Meds: . amiodarone  400 mg Oral BID  . apixaban  5 mg Oral BID  . furosemide  40 mg Oral Daily  . insulin aspart  0-15 Units Subcutaneous TID WC  . mouth rinse  15 mL Mouth Rinse BID  . metoprolol tartrate  50 mg Oral Q6H  . sodium chloride flush  10 mL Intravenous Q12H   Continuous Infusions: . sodium chloride 20 mL/hr at 10/12/19 0556   PRN Meds: acetaminophen, metoprolol tartrate, ondansetron (ZOFRAN) IV   Vital Signs    Vitals:   10/12/19 0826 10/12/19 0827 10/12/19 0828 10/12/19 0829  BP:      Pulse: (!) 34 78 77 71  Resp: (!) 37 (!) 39 (!) 38 (!) 33  Temp:      TempSrc:      SpO2: 98% 99% 95% 94%  Weight:      Height:        Intake/Output Summary (Last 24 hours) at 10/12/2019 0848 Last data filed at 10/12/2019 0830 Gross per 24 hour  Intake 1160 ml  Output 950 ml  Net 210 ml   Last 3 Weights 10/12/2019 10/12/2019 10/11/2019  Weight (lbs) 141 lb 12.1 oz 141 lb 11.2 oz 146 lb 13.2 oz  Weight (kg) 64.3 kg 64.275 kg 66.6 kg      Telemetry    NSR rate 60 bpm- Personally Reviewed  ECG    No new ECG obtained today- Personally Reviewed  Physical Exam   Constitutional:  oriented to person, place, and time. No distress.  HENT:  Head: Grossly normal Eyes:  no discharge. No scleral icterus.  Neck: no JVD, no carotid bruits  Cardiovascular: NSR no murmurs appreciated Pulmonary/Chest: Clear to auscultation bilaterally, no wheezes or rails Abdominal: Soft.  no distension.  no  tenderness.  Musculoskeletal: Normal range of motion Neurological:  normal muscle tone. Coordination normal. No atrophy Skin: Skin warm and dry Psychiatric: normal affect, pleasant   Labs    High Sensitivity Troponin:   Recent Labs  Lab 10/06/19 1604 10/06/19 2203 10/07/19 0020  TROPONINIHS 41* 38* 43*      Chemistry Recent Labs  Lab 10/06/19 1604 10/07/19 0020 10/09/19 0719 10/11/19 0452 10/12/19 0613  NA 138   < > 141 139 140  K 4.5   < > 3.6 4.1 3.9  CL 105   < > 105 106 107  CO2 21*   < > 24 24 22   GLUCOSE 170*   < > 147* 138* 156*  BUN 20   < > 19 21 21   CREATININE 0.89   < > 0.80 0.77 0.87  CALCIUM 8.7*   < > 8.4* 8.5* 8.7*  PROT 7.3  --   --   --   --   ALBUMIN 3.7  --   --   --   --   AST 46*  --   --   --   --   ALT 45*  --   --   --   --   ALKPHOS 74  --   --   --   --  BILITOT 1.2  --   --   --   --   GFRNONAA 60*   < > >60 >60 >60  GFRAA >60   < > >60 >60 >60  ANIONGAP 12   < > 12 9 11    < > = values in this interval not displayed.     Hematology Recent Labs  Lab 10/06/19 1604 10/11/19 0452 10/12/19 0613  WBC 8.6 7.7 9.1  RBC 4.40 4.45 4.90  HGB 12.7 12.8 14.0  HCT 39.5 39.1 42.7  MCV 89.8 87.9 87.1  MCH 28.9 28.8 28.6  MCHC 32.2 32.7 32.8  RDW 14.6 14.4 14.6  PLT 303 334 377    BNP Recent Labs  Lab 10/06/19 1604  BNP 240.0*     DDimer No results for input(s): DDIMER in the last 168 hours.   Radiology    No results found.  Cardiac Studies   TTE 10/07/2019 1. Left ventricular ejection fraction, by visual estimation, is 45 to  50%. The left ventricle has mild to moderately decreased function. There  is no left ventricular hypertrophy.  2. Left ventricular diastolic parameters are indeterminate.  3. The left ventricle demonstrates global hypokinesis.  4. Global right ventricle has mildly reduced systolic function.The right  ventricular size is normal. Right vetricular wall thickness was not  assessed.  5. Left atrial  size was moderately dilated.  6. Right atrial size was normal.  7. Large pleural effusion in the left lateral region.    Patient Profile     86 y.o. female with history of hypertension, diabetes, new onset atrial fibrillation with RVR, mildly reduced EF of 45 to 50% being seen for atrial fibrillation rapid ventricular response  Assessment & Plan    1. A. Fib/flutter RVR Will change metoprolol to 100 mg po BID (she has received a dose of metoprolol 50 at midnight and 6 AM),  Will start this AM Will start amiodarone 400 BID 5 days then down to 200 mg po BID,  outpt office visit for further medication titration. --continue eliquis 5 BID, need coupon  2.  Mildly reduced ejection fraction. Suspect tachycardia mediated As outpatient wil look to change metoprolol to succinate, consider adding ACE/ARB  3 pleural effusion Secondary to atrial flutter/flutter with RVR Causing systolic and diastolic CHF/pulmonary edema ---would continue lasix 40 daily PO at d/c with close outpt monitoring of renal function  4. Aortic atherosclerosis LDL 96 Consider adding crestor 5 mg daily    Total encounter time more than 25 minutes  Greater than 50% was spent in counseling and coordination of care with the patient     Signed, Ida Rogue, MD  10/12/2019, 8:48 AM

## 2019-10-12 NOTE — Progress Notes (Signed)
*  PRELIMINARY RESULTS* Echocardiogram Echocardiogram Transesophageal has been performed.  Sherrie Sport 10/12/2019, 8:42 AM

## 2019-10-14 NOTE — Discharge Summary (Signed)
[facility] [place] NAME: Vanessa Gentry    MR#:  [id number]  DATE OF BIRTH:  1932/12/28  DATE OF ADMISSION:  10/06/2019   ADMITTING PHYSICIAN: Neena Rhymes, MD  DATE OF DISCHARGE: 10/12/2019  5:03 PM  PRIMARY CARE PHYSICIAN: Ronnell Freshwater, NP   ADMISSION DIAGNOSIS:  New onset atrial fibrillation (HCC) [I48.91] Abnormal CXR [R93.89] Atrial fibrillation with rapid ventricular response (HCC) [I48.91] Atrial fibrillation with RVR ([place]) [I48.91] Dyspnea, unspecified type [R06.00] DISCHARGE DIAGNOSIS:  Active Problems:   Uncontrolled type 2 diabetes mellitus with hyperglycemia ([place])   Essential hypertension   New onset atrial fibrillation ([place])   Dyspnea   Cardiomyopathy ([place])  SECONDARY DIAGNOSIS:   Past Medical History:  Diagnosis Date  . Arthritis   . Diabetes mellitus without complication ([place])   . Dysrhythmia   . GERD (gastroesophageal reflux disease)   . Hypertension   . Pneumonia    HOSPITAL COURSE:  86 y.o. female with history of hypertension, diabetes, new onset atrial fibrillation with RVR, mildly reduced EF of 45 to 50% admitted for atrial fibrillation rapid ventricular response  1. A. Fib/flutter RVR - S/P successful cardioversion on 2/5  - rate controlled on metoprolol to 100 mg po BID - continue at D/C - at D/C -> amiodarone 400 BID 5 days then down to 200 mg po BID,  - outpt cardio f/up for further medication titration. - continue eliquis 5 BID for anticogulation  2.  Mildly reduced ejection fraction - Suspect tachycardia mediated - outpt cardio f/up and consider change metoprolol to succinate, also consider ACE/ARB as an outpt  3 pleural effusion Secondary to atrial flutter/flutter with RVR Causing systolic and diastolic CHF/pulmonary edema - continue lasix 40 daily PO at d/c with close outpt monitoring of renal function  4. Multifocal pneumonia, ruled out  5. Diabetes mellitus type 2 Last hemoglobin A1c  6.2  6. Hypertension metoprolol DISCHARGE CONDITIONS:  stable CONSULTS OBTAINED:  Treatment Team:  Kate Sable, MD End, Harrell Gave, MD DRUG ALLERGIES:   Allergies  Allergen Reactions  . Sulfa Antibiotics Other (See Comments)  . Iodinated Diagnostic Agents Rash    Betadine ok Betadine ok   DISCHARGE MEDICATIONS:   Allergies as of 10/12/2019      Reactions   Sulfa Antibiotics Other (See Comments)   Iodinated Diagnostic Agents Rash   Betadine ok Betadine ok      Medication List    STOP taking these medications   valsartan 80 MG tablet Commonly known as: DIOVAN   VITAMIN D PO     TAKE these medications   amiodarone 400 MG tablet Commonly known as: PACERONE Take 1 tablet (400 mg total) by mouth 2 (two) times daily. Amiodarone 400 mg twice a day for first 5 days followed by 200 mg oral twice a day   amiodarone 200 MG tablet Commonly known as: PACERONE Take 1 tablet (200 mg total) by mouth 2 (two) times daily. Start taking on: October 17, 2019   apixaban 5 MG Tabs tablet Commonly known as: ELIQUIS Take 1 tablet (5 mg total) by mouth 2 (two) times daily.   aspirin EC 81 MG tablet Take 81 mg by mouth daily.   Biotin 10 MG Tabs Take 10 mg by mouth daily.   metFORMIN 850 MG tablet Commonly known as: Glucophage Take 1 tablet (850 mg total) by mouth 2 (two) times daily with a meal.   metoprolol tartrate 100 MG tablet Commonly known as: LOPRESSOR Take 1  tablet (100 mg total) by mouth 2 (two) times daily.      DISCHARGE INSTRUCTIONS:   DIET:  Cardiac diet DISCHARGE CONDITION:  Stable ACTIVITY:  Activity as tolerated OXYGEN:  Home Oxygen: No.  Oxygen Delivery: room air DISCHARGE LOCATION:  home   If you experience worsening of your admission symptoms, develop shortness of breath, life threatening emergency, suicidal or homicidal thoughts you must seek medical attention immediately by calling 911 or calling your MD immediately  if symptoms less  severe.  You Must read complete instructions/literature along with all the possible adverse reactions/side effects for all the Medicines you take and that have been prescribed to you. Take any new Medicines after you have completely understood and accpet all the possible adverse reactions/side effects.   Please note  You were cared for by a hospitalist during your hospital stay. If you have any questions about your discharge medications or the care you received while you were in the hospital after you are discharged, you can call the unit and asked to speak with the hospitalist on call if the hospitalist that took care of you is not available. Once you are discharged, your primary care physician will handle any further medical issues. Please note that NO REFILLS for any discharge medications will be authorized once you are discharged, as it is imperative that you return to your primary care physician (or establish a relationship with a primary care physician if you do not have one) for your aftercare needs so that they can reassess your need for medications and monitor your lab values.    On the day of Discharge:  VITAL SIGNS:  Blood pressure 115/71, pulse 60, temperature 97.8 F (36.6 C), temperature source Oral, resp. rate 16, height 5\' 2"  (1.575 m), weight 64.3 kg, SpO2 95 %. PHYSICAL EXAMINATION:  GENERAL:  86 y.o.-year-old patient lying in the bed with no acute distress.  EYES: Pupils equal, round, reactive to light and accommodation. No scleral icterus. Extraocular muscles intact.  HEENT: Head atraumatic, normocephalic. Oropharynx and nasopharynx clear.  NECK:  Supple, no jugular venous distention. No thyroid enlargement, no tenderness.  LUNGS: Normal breath sounds bilaterally, no wheezing, rales,rhonchi or crepitation. No use of accessory muscles of respiration.  CARDIOVASCULAR: S1, S2 normal. No murmurs, rubs, or gallops.  ABDOMEN: Soft, non-tender, non-distended. Bowel sounds present.  No organomegaly or mass.  EXTREMITIES: No pedal edema, cyanosis, or clubbing.  NEUROLOGIC: Cranial nerves II through XII are intact. Muscle strength 5/5 in all extremities. Sensation intact. Gait not checked.  PSYCHIATRIC: The patient is alert and oriented x 3.  SKIN: No obvious rash, lesion, or ulcer.  DATA REVIEW:   CBC Recent Labs  Lab 10/12/19 0613  WBC 9.1  HGB 14.0  HCT 42.7  PLT 377    Chemistries  Recent Labs  Lab 10/12/19 0613  NA 140  K 3.9  CL 107  CO2 22  GLUCOSE 156*  BUN 21  CREATININE 0.87  CALCIUM 8.7*      Follow-up Information    Ronnell Freshwater, NP. Schedule an appointment as soon as possible for a visit in 1 week(s).   Specialty: Family Medicine Contact information: [place] [address] [phone]        Minna Merritts, MD. Schedule an appointment as soon as possible for a visit in 1 week(s).   Specialty: Cardiology Contact information: [address] [place] [address] [phone]  Management plans discussed with the patient, family and they are in agreement.  CODE STATUS: Prior   TOTAL TIME TAKING CARE OF THIS PATIENT: 45 minutes.    Max Sane M.D on 10/14/2019 at 4:27 PM  [facility]   CC: Primary care physician; Ronnell Freshwater, NP   Note: This dictation was prepared with Dragon dictation along with smaller phrase technology. Any transcriptional errors that result from this process are unintentional.

## 2019-10-15 ENCOUNTER — Telehealth: Payer: Self-pay | Admitting: Cardiology

## 2019-10-15 NOTE — Telephone Encounter (Signed)
Patient wants to fu in office s/p procedure at hospital with [facility] .    She is new for [employer].   Is it ok with both of you to change per patient request?

## 2019-10-15 NOTE — Telephone Encounter (Signed)
Confirmed visit on 10/16/2019 and screened for covid. klh

## 2019-10-16 ENCOUNTER — Ambulatory Visit (INDEPENDENT_AMBULATORY_CARE_PROVIDER_SITE_OTHER): Payer: PPO | Admitting: Nurse Practitioner

## 2019-10-16 ENCOUNTER — Other Ambulatory Visit: Payer: Self-pay

## 2019-10-16 VITALS — BP 131/54 | HR 52 | Temp 96.5°F | Ht 64.0 in | Wt 146.4 lb

## 2019-10-16 DIAGNOSIS — I10 Essential (primary) hypertension: Secondary | ICD-10-CM

## 2019-10-16 DIAGNOSIS — E1165 Type 2 diabetes mellitus with hyperglycemia: Secondary | ICD-10-CM | POA: Diagnosis not present

## 2019-10-16 DIAGNOSIS — Z09 Encounter for follow-up examination after completed treatment for conditions other than malignant neoplasm: Secondary | ICD-10-CM

## 2019-10-16 DIAGNOSIS — I4891 Unspecified atrial fibrillation: Secondary | ICD-10-CM | POA: Diagnosis not present

## 2019-10-16 NOTE — Telephone Encounter (Signed)
Okay with me 

## 2019-10-16 NOTE — Progress Notes (Signed)
[facility] [place] [address]  Internal MEDICINE  Office Visit Note  Patient Name: Vanessa Gentry  [id number]  [id number]  Date of Service: 10/17/2019     Chief Complaint  Patient presents with  . Hospitalization Follow-up    atrial fibrillation  . Diarrhea    since getting out hosp, would like to know if she can take something for this      The patient is here for hospital follow up. She was in the hospital from 10/06/2019 through 10/12/2019. Initially went to ER for shortness of breath. She was found to be in new onseet a-fib with rapid ventricular response. She did have successful cardioversion on 10/12/2019. She is now on metoprolol 100mg  bid, amiodarone 200mg  bid, and eliquis 5mg  bid. She is feeling tired and a little weak. She does have follow up with Cardiology next Tuesday. Her HgbA1c prior to admission was 6.2. she states that she did have decreased appetite while hospitalized. This is gradually getting a bit better.   Pt is here for recent hospital follow up.  Current Medication: Outpatient Encounter Medications as of 10/16/2019  Medication Sig  . amiodarone (PACERONE) 200 MG tablet Take 1 tablet (200 mg total) by mouth 2 (two) times daily.  Marland Kitchen amiodarone (PACERONE) 400 MG tablet Take 1 tablet (400 mg total) by mouth 2 (two) times daily. Amiodarone 400 mg twice a day for first 5 days followed by 200 mg oral twice a day  . apixaban (ELIQUIS) 5 MG TABS tablet Take 1 tablet (5 mg total) by mouth 2 (two) times daily.  Marland Kitchen aspirin EC 81 MG tablet Take 81 mg by mouth daily.  . Biotin 10 MG TABS Take 10 mg by mouth daily.  . metFORMIN (GLUCOPHAGE) 850 MG tablet Take 1 tablet (850 mg total) by mouth 2 (two) times daily with a meal.  . metoprolol tartrate (LOPRESSOR) 100 MG tablet Take 1 tablet (100 mg total) by mouth 2 (two) times daily.   No facility-administered encounter medications on file as of 10/16/2019.    Surgical History: Past Surgical  History:  Procedure Laterality Date  . ABDOMINAL HYSTERECTOMY    . BREAST BIOPSY Left 2011   NEG  . CARDIOVERSION N/A 10/12/2019   Procedure: CARDIOVERSION;  Surgeon: Minna Merritts, MD;  Location: [facility] ORS;  Service: Cardiovascular;  Laterality: N/A;  . CATARACT EXTRACTION W/PHACO Right 03/13/2015   Procedure: CATARACT EXTRACTION PHACO AND INTRAOCULAR LENS PLACEMENT ([place]);  Surgeon: Leandrew Koyanagi, MD;  Location: [facility] ORS;  Service: Ophthalmology;  Laterality: Right;  [place]  2:40  AP 20.3   CDE 32.41 cassette lot #   [id number]  . CHOLECYSTECTOMY    . ORIF ANKLE FRACTURE    . TEE WITHOUT CARDIOVERSION N/A 10/12/2019   Procedure: TRANSESOPHAGEAL ECHOCARDIOGRAM (TEE);  Surgeon: Minna Merritts, MD;  Location: [facility] ORS;  Service: Cardiovascular;  Laterality: N/A;  . UPPER GI ENDOSCOPY      Medical History: Past Medical History:  Diagnosis Date  . Arthritis   . Diabetes mellitus without complication ([place])   . Dysrhythmia   . GERD (gastroesophageal reflux disease)   . Hypertension   . Pneumonia     Family History: Family History  Problem Relation Age of Onset  . Breast cancer Neg Hx     Social History   Socioeconomic History  . Marital status: Divorced    Spouse name: Not on file  . Number of children: Not on file  . Years of  education: Not on file  . Highest education level: Not on file  Occupational History  . Not on file  Tobacco Use  . Smoking status: Never Smoker  . Smokeless tobacco: Never Used  Substance and Sexual Activity  . Alcohol use: No  . Drug use: No  . Sexual activity: Not on file  Other Topics Concern  . Not on file  Social History Narrative  . Not on file   Social Determinants of Health   Financial Resource Strain:   . Difficulty of Paying Living Expenses: Not on file  Food Insecurity:   . Worried About Charity fundraiser in the Last Year: Not on file  . Ran Out of Food in the Last Year: Not on file  Transportation Needs:   . Lack of  Transportation (Medical): Not on file  . Lack of Transportation (Non-Medical): Not on file  Physical Activity:   . Days of Exercise per Week: Not on file  . Minutes of Exercise per Session: Not on file  Stress:   . Feeling of Stress : Not on file  Social Connections:   . Frequency of Communication with Friends and Family: Not on file  . Frequency of Social Gatherings with Friends and Family: Not on file  . Attends Religious Services: Not on file  . Active Member of Clubs or Organizations: Not on file  . Attends Archivist Meetings: Not on file  . Marital Status: Not on file  Intimate Partner Violence:   . Fear of Current or Ex-Partner: Not on file  . Emotionally Abused: Not on file  . Physically Abused: Not on file  . Sexually Abused: Not on file      Review of Systems  Constitutional: Positive for activity change and fatigue. Negative for chills and unexpected weight change.       Improving after discharge from hospital   HENT: Negative for congestion, postnasal drip, rhinorrhea, sneezing and sore throat.   Respiratory: Positive for shortness of breath. Negative for cough, chest tightness and wheezing.        SOB with exertion.   Cardiovascular: Negative for chest pain and palpitations.  Gastrointestinal: Negative for abdominal pain, constipation, diarrhea, nausea and vomiting.  Endocrine: Negative for cold intolerance, heat intolerance, polydipsia and polyuria.  Musculoskeletal: Negative for arthralgias, back pain, joint swelling and neck pain.  Skin: Negative for rash.  Allergic/Immunologic: Negative for environmental allergies.  Neurological: Negative for dizziness, tremors, numbness and headaches.  Hematological: Negative for adenopathy. Does not bruise/bleed easily.  Psychiatric/Behavioral: Negative for behavioral problems (Depression), dysphoric mood, sleep disturbance and suicidal ideas. The patient is not nervous/anxious.     Today's Vitals   10/16/19 1504   BP: (!) 131/54  Pulse: (!) 52  Temp: (!) 96.5 F (35.8 C)  SpO2: 98%  Weight: 146 lb 6.4 oz (66.4 kg)  Height: 5\' 4"  (1.626 m)   Body mass index is 25.13 kg/m.  Physical Exam Vitals and nursing note reviewed.  Constitutional:      General: She is not in acute distress.    Appearance: Normal appearance. She is well-developed. She is not diaphoretic.     Comments: Mildly fatigued.   HENT:     Head: Normocephalic and atraumatic.     Nose: Nose normal.     Mouth/Throat:     Pharynx: No oropharyngeal exudate.  Eyes:     Pupils: Pupils are equal, round, and reactive to light.  Neck:     Thyroid: No thyromegaly.  Vascular: No carotid bruit or JVD.     Trachea: No tracheal deviation.  Cardiovascular:     Rate and Rhythm: Normal rate and regular rhythm.     Pulses: Normal pulses.     Heart sounds: Normal heart sounds. No murmur. No friction rub. No gallop.   Pulmonary:     Effort: Pulmonary effort is normal. No respiratory distress.     Breath sounds: Normal breath sounds. No wheezing or rales.  Chest:     Chest wall: No tenderness.  Abdominal:     Palpations: Abdomen is soft.  Musculoskeletal:        General: Normal range of motion.     Cervical back: Normal range of motion and neck supple.  Lymphadenopathy:     Cervical: No cervical adenopathy.  Skin:    General: Skin is warm and dry.  Neurological:     General: No focal deficit present.     Mental Status: She is alert and oriented to person, place, and time.     Cranial Nerves: No cranial nerve deficit.  Psychiatric:        Mood and Affect: Mood normal.        Behavior: Behavior normal.        Thought Content: Thought content normal.        Judgment: Judgment normal.    Assessment/Plan: 1. Hospital discharge follow-up Patient recently hospitalized for new onset Atrial fibrillation. Successful cardioversion prior to discharge. Continue with metoprolol, amiodarone, and eliquis as prescribed in hospital. Follow  up with cardiology as scheduled.   2. Atrial fibrillation with RVR [facility]) Patient recently hospitalized for new onset Atrial fibrillation. Successful cardioversion prior to discharge. Continue with metoprolol, amiodarone, and eliquis as prescribed in hospital. Follow up with cardiology as scheduled.   3. Essential hypertension bp stable. Continue medication as prescribed   4. Type 2 diabetes mellitus with hyperglycemia, without long-term current use of insulin (HCC) No changes to diabetic medications. Continue to monitor sugar closely.   General Counseling: Dare verbalizes understanding of the findings of todays visit and agrees with plan of treatment. I have discussed any further diagnostic evaluation that may be needed or ordered today. We also reviewed her medications today. she has been encouraged to call the office with any questions or concerns that should arise related to todays visit.    Counseling:   This patient was seen by Leretha Pol FNP Collaboration with Dr Lavera Guise as a part of collaborative care agreement    I have reviewed all medical records from hospital follow up including radiology reports and consults from other physicians. Appropriate follow up diagnostics will be scheduled as needed. Patient/ Family understands the plan of treatment. Time spent 73minutes.   Dr Lavera Guise, MD Internal Medicine

## 2019-10-17 ENCOUNTER — Encounter: Payer: Self-pay | Admitting: Nurse Practitioner

## 2019-10-17 DIAGNOSIS — Z09 Encounter for follow-up examination after completed treatment for conditions other than malignant neoplasm: Secondary | ICD-10-CM | POA: Insufficient documentation

## 2019-10-22 NOTE — Progress Notes (Signed)
Cardiology Office Note  Date:  10/23/2019   ID:  Vanessa Gentry, Vanessa Gentry 02/14/1933, MRN [id number]  PCP:  Ronnell Freshwater, NP   Chief Complaint  Patient presents with  . office visit    Hospital F/U-A Fib; Meds vebrally reviewed with patient.    HPI:  86 y.o. female with history of  hypertension,  diabetes,  In the hospital February 2021 , new onset atrial fibrillation with RVR, mildly reduced EF of 45 to 50%  Secondary to challenging rate control efforts, underwent TEE cardioversion She presents today for follow-up of her atrial fibrillation, post hospital discharge  Hospital records reviewed with her, TEE performed October 12, 2019 followed by cardioversion mildly depressedl LV function with no RWMAs and no mural apical thrombus.  .  Estimated ejection fraction was 45%. Global hypokinesis.  Right sided cardiac chambers were normal with no evidence of pulmonary hypertension. 2D and color flow confirmed small  PFO   spontaneous contrast noted left atrium and  no thrombus in the LA and LA appendage  mildly dilated Left atrium   descending thoracic aorta had moderate  mural aortic debris with no evidence of aneurysmal dilation or disection  Cardioversion was successful NSR restored Amiodarone was started post cardioversion for paroxysmal tachycardia/atrial fibrillation episodes Also on Eliquis with metoprolol  In follow-up today reports that she feels relatively well Denies any tachycardia palpitations, no near syncope syncope Occasional shortness of breath Denies any abdominal bloating leg swelling   PMH:   has a past medical history of Arthritis, Diabetes mellitus without complication ([place]), Dysrhythmia, GERD (gastroesophageal reflux disease), Hypertension, and Pneumonia.  PSH:    Past Surgical History:  Procedure Laterality Date  . ABDOMINAL HYSTERECTOMY    . BREAST BIOPSY Left 2011   NEG  . CARDIOVERSION N/A 10/12/2019   Procedure: CARDIOVERSION;  Surgeon: Minna Merritts, MD;  Location: [facility] ORS;  Service: Cardiovascular;  Laterality: N/A;  . CATARACT EXTRACTION W/PHACO Right 03/13/2015   Procedure: CATARACT EXTRACTION PHACO AND INTRAOCULAR LENS PLACEMENT ([place]);  Surgeon: Leandrew Koyanagi, MD;  Location: [facility] ORS;  Service: Ophthalmology;  Laterality: Right;  [place]  2:40  AP 20.3   CDE 32.41 cassette lot #   [id number]  . CHOLECYSTECTOMY    . ORIF ANKLE FRACTURE    . TEE WITHOUT CARDIOVERSION N/A 10/12/2019   Procedure: TRANSESOPHAGEAL ECHOCARDIOGRAM (TEE);  Surgeon: Minna Merritts, MD;  Location: [facility] ORS;  Service: Cardiovascular;  Laterality: N/A;  . UPPER GI ENDOSCOPY      Current Outpatient Medications  Medication Sig Dispense Refill  . amiodarone (PACERONE) 200 MG tablet Take 1 tablet (200 mg total) by mouth daily. 90 tablet 3  . apixaban (ELIQUIS) 5 MG TABS tablet Take 1 tablet (5 mg total) by mouth 2 (two) times daily. 60 tablet 0  . Biotin 10 MG TABS Take 10 mg by mouth daily.    . metFORMIN (GLUCOPHAGE) 850 MG tablet Take 1 tablet (850 mg total) by mouth 2 (two) times daily with a meal. 60 tablet 3  . metoprolol tartrate (LOPRESSOR) 100 MG tablet Take 1 tablet (100 mg total) by mouth 2 (two) times daily. 60 tablet 0  . metoprolol succinate (TOPROL-XL) 100 MG 24 hr tablet Take 1 tablet (100 mg total) by mouth daily. Take with or immediately following a meal. 90 tablet 3   No current facility-administered medications for this visit.     Allergies:   Sulfa antibiotics and Iodinated diagnostic agents   Social History:  The  patient  reports that she has never smoked. She has never used smokeless tobacco. She reports that she does not drink alcohol or use drugs.   Family History:   family history includes Diabetes in her brother; Heart attack in her sister; Heart disease in her sister; Heart failure in her father.    Review of Systems: Review of Systems  Constitutional: Negative.   HENT: Negative.   Respiratory: Negative.    Cardiovascular: Negative.   Gastrointestinal: Negative.   Musculoskeletal: Negative.   Neurological: Negative.   Psychiatric/Behavioral: Negative.   All other systems reviewed and are negative.    PHYSICAL EXAM: VS:  BP 136/70 (BP Location: Left Arm, Patient Position: Sitting, Cuff Size: Normal)   Pulse (!) 55   Ht 5\' 3"  (1.6 m)   Wt 145 lb 8 oz (66 kg)   SpO2 99%   BMI 25.77 kg/m  , BMI Body mass index is 25.77 kg/m. GEN: Well nourished, well developed, in no acute distress HEENT: normal Neck: no JVD, carotid bruits, or masses Cardiac: RRR; no murmurs, rubs, or gallops,no edema  Respiratory:  clear to auscultation bilaterally, normal work of breathing GI: soft, nontender, nondistended, + BS MS: no deformity or atrophy Skin: warm and dry, no rash Neuro:  Strength and sensation are intact Psych: euthymic mood, full affect   Recent Labs: 09/10/2019: TSH 2.490 10/06/2019: ALT 45; B Natriuretic Peptide 240.0 10/12/2019: BUN 21; Creatinine, Ser 0.87; Hemoglobin 14.0; Platelets 377; Potassium 3.9; Sodium 140    Lipid Panel Lab Results  Component Value Date   CHOL 150 10/07/2019   HDL 41 10/07/2019   LDLCALC 96 10/07/2019   TRIG 64 10/07/2019      Wt Readings from Last 3 Encounters:  10/23/19 145 lb 8 oz (66 kg)  10/16/19 146 lb 6.4 oz (66.4 kg)  10/12/19 141 lb 12.1 oz (64.3 kg)       ASSESSMENT AND PLAN:  Problem List Items Addressed This Visit      Cardiology Problems   Atrial fibrillation with RVR (HCC) - Primary   Relevant Medications   amiodarone (PACERONE) 200 MG tablet   metoprolol succinate (TOPROL-XL) 100 MG 24 hr tablet   Other Relevant Orders   EKG 12-Lead   Cardiomyopathy (HCC)   Relevant Medications   amiodarone (PACERONE) 200 MG tablet   metoprolol succinate (TOPROL-XL) 100 MG 24 hr tablet   Other Relevant Orders   EKG 12-Lead   Essential hypertension   Relevant Medications   amiodarone (PACERONE) 200 MG tablet   metoprolol succinate  (TOPROL-XL) 100 MG 24 hr tablet    Other Visit Diagnoses    Pleural effusion        1. A. Fib/flutter RVR Change metoprolol tartrate to metoprolol succinate 100 daily Amiodarone down to 200 daily --continue eliquis 5 BID Discussed with her to watch for tachypalpitations, call the office for any recurrent symptoms  2.  Mildly reduced ejection fraction. Change to metoprolol succinate Monitor reduced EF in the setting of atrial fibrillation She is concerned about polypharmacy, will hold off on adding ACE or ARB  3 pleural effusion Improved aeration of lungs on today's exam Maintaining normal sinus rhythm We will hold off on Lasix at this time as she is relatively asymptomatic  4. Aortic atherosclerosis LDL 96 In follow-up consider adding a statin She is concerned about polypharmacy   Disposition:   F/U  6 months   Total encounter time more than 25 minutes  Greater than 50% was spent in  counseling and coordination of care with the patient    Signed, Esmond Plants, M.D., Ph.D. [place], [place]

## 2019-10-23 ENCOUNTER — Other Ambulatory Visit: Payer: Self-pay

## 2019-10-23 ENCOUNTER — Encounter: Payer: Self-pay | Admitting: Cardiovascular Disease

## 2019-10-23 ENCOUNTER — Ambulatory Visit (INDEPENDENT_AMBULATORY_CARE_PROVIDER_SITE_OTHER): Payer: PPO | Admitting: Cardiovascular Disease

## 2019-10-23 VITALS — BP 136/70 | HR 55 | Ht 63.0 in | Wt 145.5 lb

## 2019-10-23 DIAGNOSIS — J90 Pleural effusion, not elsewhere classified: Secondary | ICD-10-CM | POA: Diagnosis not present

## 2019-10-23 DIAGNOSIS — I4891 Unspecified atrial fibrillation: Secondary | ICD-10-CM | POA: Diagnosis not present

## 2019-10-23 DIAGNOSIS — I10 Essential (primary) hypertension: Secondary | ICD-10-CM | POA: Diagnosis not present

## 2019-10-23 DIAGNOSIS — I420 Dilated cardiomyopathy: Secondary | ICD-10-CM | POA: Diagnosis not present

## 2019-10-23 MED ORDER — METOPROLOL SUCCINATE ER 100 MG PO TB24: 100.0000 mg | ORAL_TABLET | Freq: Every day | ORAL | 3 refills | Status: DC

## 2019-10-23 MED ORDER — AMIODARONE HCL 200 MG PO TABS: 200.0000 mg | ORAL_TABLET | Freq: Every day | ORAL | 3 refills | Status: DC

## 2019-10-23 NOTE — Patient Instructions (Addendum)
Medication Instructions:  Your physician has recommended you make the following change in your medication:  1. DECREASE Amiodarone down to 200 mg once daily 2. STOP Metoprolol Tartrate 3. START Metoprolol Succinate 100 mg once daily 4. STOP Aspirin 5. STAY on Eliquis 5 mg twice a day   If you need a refill on your cardiac medications before your next appointment, please call your pharmacy.    Lab work: No new labs needed   If you have labs (blood work) drawn today and your tests are completely normal, you will receive your results only by: Marland Kitchen MyChart Message (if you have MyChart) OR . A paper copy in the mail If you have any lab test that is abnormal or we need to change your treatment, we will call you to review the results.   Testing/Procedures: No new testing needed   Follow-Up: At [facility], you and your health needs are our priority.  As part of our continuing mission to provide you with exceptional heart care, we have created designated Provider Care Teams.  These Care Teams include your primary Cardiologist (physician) and Advanced Practice Providers (APPs -  Physician Assistants and Nurse Practitioners) who all work together to provide you with the care you need, when you need it.  . You will need a follow up appointment in 6 months   . Providers on your designated Care Team:   . Murray Hodgkins, NP . Christell Faith, PA-C . Marrianne Mood, PA-C  Any Other Special Instructions Will Be Listed Below (If Applicable).  For educational health videos Log in to : [email or web address] Or : [email or web address], password : [email or web address]

## 2019-11-05 ENCOUNTER — Other Ambulatory Visit: Payer: Self-pay | Admitting: Cardiovascular Disease

## 2019-11-05 MED ORDER — APIXABAN 5 MG PO TABS: 5.0000 mg | ORAL_TABLET | Freq: Two times a day (BID) | ORAL | 1 refills | Status: DC

## 2019-11-05 NOTE — Telephone Encounter (Signed)
Pt's age 86, wt 54 kg, SCr 0.87, CrCl 44.98, last ov w/ TG 10/23/19. Eliquis refilled as requested.

## 2019-11-05 NOTE — Telephone Encounter (Signed)
Refill request

## 2019-11-05 NOTE — Telephone Encounter (Signed)
*  STAT* If patient is at the pharmacy, call can be transferred to refill team.   1. Which medications need to be refilled? (please list name of each medication and dose if known) Eliquis 5 mg po BID  2. Which pharmacy/location (including street and city if local pharmacy) is medication to be sent to?[facility] [address] [place]   3. Do they need a 30 day or 90 day supply? [place]

## 2019-11-16 ENCOUNTER — Telehealth: Payer: Self-pay

## 2019-11-16 NOTE — Telephone Encounter (Signed)
LMOM TO CONFIRM AND SCREEN FOR 11-20-19 OV.

## 2019-11-19 ENCOUNTER — Telehealth: Payer: Self-pay

## 2019-11-19 NOTE — Telephone Encounter (Signed)
Confirmed appointment on 11/20/2019 and screened for covid. [facility] 

## 2019-11-20 ENCOUNTER — Ambulatory Visit (INDEPENDENT_AMBULATORY_CARE_PROVIDER_SITE_OTHER): Payer: PPO | Admitting: Nurse Practitioner

## 2019-11-20 ENCOUNTER — Encounter: Payer: Self-pay | Admitting: Nurse Practitioner

## 2019-11-20 ENCOUNTER — Other Ambulatory Visit: Payer: Self-pay

## 2019-11-20 VITALS — BP 120/83 | HR 101 | Temp 96.0°F | Resp 16 | Ht 64.0 in | Wt 142.2 lb

## 2019-11-20 DIAGNOSIS — I4891 Unspecified atrial fibrillation: Secondary | ICD-10-CM

## 2019-11-20 DIAGNOSIS — E1165 Type 2 diabetes mellitus with hyperglycemia: Secondary | ICD-10-CM | POA: Diagnosis not present

## 2019-11-20 DIAGNOSIS — I10 Essential (primary) hypertension: Secondary | ICD-10-CM | POA: Diagnosis not present

## 2019-11-20 LAB — POCT GLYCOSYLATED HEMOGLOBIN (HGB A1C): Hemoglobin A1C: 6.4 % — AB (ref 4.0–5.6)

## 2019-11-20 NOTE — Progress Notes (Signed)
[facility] [place], [place] [address]  Internal MEDICINE  Office Visit Note  Patient Name: Vanessa Gentry  [id number]  [id number]  Date of Service: 11/28/2019  Chief Complaint  Patient presents with  . Diabetes  . Hypertension    The patient is here for routine follow up. Blood sugars are well controlled. HgbA1c is 6.4 today. She has lost four pounds since she was last seen. Concerned about elevated heart rate today. Has history of a-fib. Recently saw Dr. Rockey Situ, her cardiologist, and does not have to go back for 6 months. She states she feels well. No new concerns or complaints today.  Blood sugars are well controlled. Her HgbA1c is 6.4 today.       Current Medication: Outpatient Encounter Medications as of 11/20/2019  Medication Sig  . amiodarone (PACERONE) 200 MG tablet Take 1 tablet (200 mg total) by mouth daily.  Marland Kitchen apixaban (ELIQUIS) 5 MG TABS tablet Take 1 tablet (5 mg total) by mouth 2 (two) times daily.  . Biotin 10 MG TABS Take 10 mg by mouth daily.  . metFORMIN (GLUCOPHAGE) 850 MG tablet Take 1 tablet (850 mg total) by mouth 2 (two) times daily with a meal.  . metoprolol succinate (TOPROL-XL) 100 MG 24 hr tablet Take 1 tablet (100 mg total) by mouth daily. Take with or immediately following a meal.  . [DISCONTINUED] metoprolol tartrate (LOPRESSOR) 100 MG tablet Take 1 tablet (100 mg total) by mouth 2 (two) times daily. (Patient not taking: Reported on 11/20/2019)   No facility-administered encounter medications on file as of 11/20/2019.    Surgical History: Past Surgical History:  Procedure Laterality Date  . ABDOMINAL HYSTERECTOMY    . BREAST BIOPSY Left 2011   NEG  . CARDIOVERSION N/A 10/12/2019   Procedure: CARDIOVERSION;  Surgeon: Minna Merritts, MD;  Location: [facility] ORS;  Service: Cardiovascular;  Laterality: N/A;  . CATARACT EXTRACTION W/PHACO Right 03/13/2015   Procedure: CATARACT EXTRACTION PHACO AND INTRAOCULAR LENS PLACEMENT  ([place]);  Surgeon: Leandrew Koyanagi, MD;  Location: [facility] ORS;  Service: Ophthalmology;  Laterality: Right;  [place]  2:40  AP 20.3   CDE 32.41 cassette lot #   [id number]  . CHOLECYSTECTOMY    . ORIF ANKLE FRACTURE    . TEE WITHOUT CARDIOVERSION N/A 10/12/2019   Procedure: TRANSESOPHAGEAL ECHOCARDIOGRAM (TEE);  Surgeon: Minna Merritts, MD;  Location: [facility] ORS;  Service: Cardiovascular;  Laterality: N/A;  . UPPER GI ENDOSCOPY      Medical History: Past Medical History:  Diagnosis Date  . Arthritis   . Diabetes mellitus without complication ([place])   . Dysrhythmia   . GERD (gastroesophageal reflux disease)   . Hypertension   . Pneumonia     Family History: Family History  Problem Relation Age of Onset  . Heart failure Father   . Heart disease Sister   . Heart attack Sister   . Diabetes Brother   . Breast cancer Neg Hx     Social History   Socioeconomic History  . Marital status: Divorced    Spouse name: Not on file  . Number of children: Not on file  . Years of education: Not on file  . Highest education level: Not on file  Occupational History  . Not on file  Tobacco Use  . Smoking status: Never Smoker  . Smokeless tobacco: Never Used  Substance and Sexual Activity  . Alcohol use: No  . Drug use: No  . Sexual activity: Not on  file  Other Topics Concern  . Not on file  Social History Narrative  . Not on file   Social Determinants of Health   Financial Resource Strain:   . Difficulty of Paying Living Expenses:   Food Insecurity:   . Worried About Charity fundraiser in the Last Year:   . Arboriculturist in the Last Year:   Transportation Needs:   . Film/video editor (Medical):   Marland Kitchen Lack of Transportation (Non-Medical):   Physical Activity:   . Days of Exercise per Week:   . Minutes of Exercise per Session:   Stress:   . Feeling of Stress :   Social Connections:   . Frequency of Communication with Friends and Family:   . Frequency of Social Gatherings  with Friends and Family:   . Attends Religious Services:   . Active Member of Clubs or Organizations:   . Attends Archivist Meetings:   Marland Kitchen Marital Status:   Intimate Partner Violence:   . Fear of Current or Ex-Partner:   . Emotionally Abused:   Marland Kitchen Physically Abused:   . Sexually Abused:       Review of Systems  Constitutional: Positive for fatigue. Negative for activity change, chills and unexpected weight change.       Fatigue levels continue to improve.   HENT: Negative for congestion, postnasal drip, rhinorrhea, sneezing and sore throat.   Respiratory: Negative for cough, chest tightness, shortness of breath and wheezing.   Cardiovascular: Negative for chest pain and palpitations.  Gastrointestinal: Negative for abdominal pain, constipation, diarrhea, nausea and vomiting.  Endocrine: Negative for cold intolerance, heat intolerance, polydipsia and polyuria.       Blood sugars doing well   Musculoskeletal: Negative for arthralgias, back pain, joint swelling and neck pain.  Skin: Negative for rash.  Allergic/Immunologic: Negative for environmental allergies.  Neurological: Negative for dizziness, tremors, numbness and headaches.  Hematological: Negative for adenopathy. Does not bruise/bleed easily.  Psychiatric/Behavioral: Negative for behavioral problems (Depression), sleep disturbance and suicidal ideas. The patient is not nervous/anxious.     Today's Vitals   11/20/19 1439  BP: 120/83  Pulse: (!) 101  Resp: 16  Temp: (!) 96 F (35.6 C)  SpO2: 98%  Weight: 142 lb 3.2 oz (64.5 kg)  Height: 5\' 4"  (1.626 m)   Body mass index is 24.41 kg/m.  Physical Exam Vitals and nursing note reviewed.  Constitutional:      General: She is not in acute distress.    Appearance: Normal appearance. She is well-developed. She is not diaphoretic.  HENT:     Head: Normocephalic and atraumatic.     Nose: Nose normal.     Mouth/Throat:     Pharynx: No oropharyngeal exudate.   Eyes:     Pupils: Pupils are equal, round, and reactive to light.  Neck:     Thyroid: No thyromegaly.     Vascular: No JVD.     Trachea: No tracheal deviation.  Cardiovascular:     Rate and Rhythm: Normal rate and regular rhythm.     Heart sounds: Murmur present. No friction rub. No gallop.      Comments: Heart rhythm currently regular. There is soft, systolic murmur auscultated.  Pulmonary:     Effort: Pulmonary effort is normal. No respiratory distress.     Breath sounds: Normal breath sounds. No wheezing or rales.  Chest:     Chest wall: No tenderness.  Abdominal:     Palpations:  Abdomen is soft.  Musculoskeletal:        General: Normal range of motion.     Cervical back: Normal range of motion and neck supple.  Lymphadenopathy:     Cervical: No cervical adenopathy.  Skin:    General: Skin is warm and dry.  Neurological:     Mental Status: She is alert and oriented to person, place, and time.     Cranial Nerves: No cranial nerve deficit.  Psychiatric:        Behavior: Behavior normal.        Thought Content: Thought content normal.        Judgment: Judgment normal.    Assessment/Plan: 1. Type 2 diabetes mellitus with hyperglycemia, without long-term current use of insulin (HCC) - POCT HgB A1C 6.4 today. Continue diabetic medication as prescribed   2. Essential hypertension Blood pressure stable. Continue medication as prescribed   3. Atrial fibrillation with RVR (HCC) Currently stable. Continue regular visits with cardiology as scheduled.   General Counseling: Vanessa Gentry verbalizes understanding of the findings of todays visit and agrees with plan of treatment. I have discussed any further diagnostic evaluation that may be needed or ordered today. We also reviewed her medications today. she has been encouraged to call the office with any questions or concerns that should arise related to todays visit.  Diabetes Counseling:  1. Addition of ACE inh/ ARB'S for  nephroprotection. Microalbumin is updated  2. Diabetic foot care, prevention of complications. Podiatry consult 3. Exercise and lose weight.  4. Diabetic eye examination, Diabetic eye exam is updated  5. Monitor blood sugar closlely. nutrition counseling.  6. Sign and symptoms of hypoglycemia including shaking sweating,confusion and headaches.  This patient was seen by Leretha Pol FNP Collaboration with Dr Lavera Guise as a part of collaborative care agreement  Orders Placed This Encounter  Procedures  . POCT HgB A1C      Total time spent: 30 Minutes    Time spent includes review of chart, medications, test results, and follow up plan with the patient.      Dr Lavera Guise Internal medicine

## 2019-12-06 ENCOUNTER — Other Ambulatory Visit: Payer: Self-pay | Admitting: Cardiovascular Disease

## 2019-12-06 MED ORDER — AMIODARONE HCL 200 MG PO TABS: 200.0000 mg | ORAL_TABLET | Freq: Every day | ORAL | 3 refills | Status: DC

## 2019-12-06 NOTE — Telephone Encounter (Signed)
*  STAT* If patient is at the pharmacy, call can be transferred to refill team.   1. Which medications need to be refilled? (please list name of each medication and dose if known)   Amiodarone 200 mg po q d   2. Which pharmacy/location (including street and city if local pharmacy) is medication to be sent to?  [facility] [address] [place]   3. Do they need a 30 day or 90 day supply? [place]

## 2019-12-10 ENCOUNTER — Telehealth: Payer: Self-pay | Admitting: Cardiovascular Disease

## 2019-12-10 NOTE — Telephone Encounter (Signed)
Pt c/o medication issue:  1. Name of Medication: metoprolol   2. How are you currently taking this medication (dosage and times per day)? 100 MG 1 tablet daily   3. Are you having a reaction (difficulty breathing--STAT)? Diarrhea, very weak   4. What is your medication issue? Patient has been having issues with metoprolol medication, states it has caused diarrhea and weakness.  Patient stopped medication on Saturday.  Patient would also like to clarify if she should still be taking amiodarone medication.  Please call to discuss.

## 2019-12-11 NOTE — Telephone Encounter (Signed)
Spoke with patient and reviewed medications that she is currently taking. She stated that on Saturday she stopped taking the metoprolol and has not had diarrhea since then. She has no heart rates or blood pressures. She states that it seems to be fine now and has been doing ok. She states that while taking metoprolol she started having diarrhea which made her really weak and she just is not able to take that medication. Advised that I would send over to provider for review and would be in touch if he should have any further recommendations. Requested that she please monitor her blood pressures daily and keep a log of those. She also states that her heart rate has been fine as well. Instructed her to please call if pressures remain 140/80 or higher. She verbalized understanding of our conversation with no further questions at this time.

## 2019-12-13 NOTE — Telephone Encounter (Signed)
She was previously taking metoprolol tartrate 100 BID before changing to metoprolol succinate 100 Can she switch back to metoprolol tartrate 100 BID? We need BP and heart rates

## 2019-12-14 ENCOUNTER — Other Ambulatory Visit: Payer: Self-pay | Admitting: Internal Medicine

## 2019-12-14 DIAGNOSIS — E1165 Type 2 diabetes mellitus with hyperglycemia: Secondary | ICD-10-CM

## 2019-12-14 NOTE — Telephone Encounter (Signed)
Attempted to call patient. LMTCB 12/14/2019

## 2019-12-21 MED ORDER — METOPROLOL TARTRATE 100 MG PO TABS: 100.0000 mg | ORAL_TABLET | Freq: Two times a day (BID) | ORAL | 3 refills | Status: DC

## 2019-12-21 NOTE — Telephone Encounter (Signed)
Spoke with patient and reviewed provider recommendations that we can switch her back to Metoprolol tartrate 100 mg twice a day and see if she tolerates this better. She was agreeable because she did not have these problems with the other medication. Confirmed pharmacy and sent in prescription. Reviewed difference between the two and to use caution not to take the Metoprolol succinate twice a day but the metoprolol tartrate. She verbalized understanding of our conversation with no further questions at this time.

## 2019-12-21 NOTE — Telephone Encounter (Signed)
Left voicemail message to please call us with blood pressures and heart rates for provider to review if medication is helping with instructions to call back if any questions.

## 2020-01-14 NOTE — Progress Notes (Signed)
Office Visit    Patient Name: Vanessa Gentry Date of Encounter: 01/15/2020  Primary Care Provider:  Ronnell Freshwater, NP Primary Cardiologist:  Ida Rogue, MD Electrophysiologist:  None   Chief Complaint    Vanessa Gentry is a 86 y.o. female with a hx of HTN, DM2, atrial fibrillation, HFrEF, aortic atherosclerosis, PFO presents today for irregular heartbeats  Past Medical History    Past Medical History:  Diagnosis Date  . Arthritis   . Diabetes mellitus without complication ([place])   . Dysrhythmia   . GERD (gastroesophageal reflux disease)   . Hypertension   . Pneumonia    Past Surgical History:  Procedure Laterality Date  . ABDOMINAL HYSTERECTOMY    . BREAST BIOPSY Left 2011   NEG  . CARDIOVERSION N/A 10/12/2019   Procedure: CARDIOVERSION;  Surgeon: Minna Merritts, MD;  Location: [facility] OR[facility];  Service: Cardiovascular;  Laterality: N/A;  . CATARACT EXTRACTION W/PHACO Right 03/13/2015   Procedure: CATARACT EXTRACTION PHACO AND INTRAOCULAR LENS PLACEMENT ([place]);  Surgeon: Leandrew Koyanagi, MD;  Location: [facility] OR[facility];  Service: Ophthalmology;  Laterality: Right;  [place]  2:40  AP 20.3   CDE 32.41 cassette lot #   [id number]  . CHOLECYSTECTOMY    . ORIF ANKLE FRACTURE    . TEE WITHOUT CARDIOVERSION N/A 10/12/2019   Procedure: TRANSESOPHAGEAL ECHOCARDIOGRAM (TEE);  Surgeon: Minna Merritts, MD;  Location: [facility] ORS;  Service: Cardiovascular;  Laterality: N/A;  . UPPER GI ENDOSCOPY      Allergies  Allergies  Allergen Reactions  . Sulfa Antibiotics Other (See Comments)  . Iodinated Diagnostic Agents Rash    Betadine ok Betadine ok    History of Present Illness    [name] is a 86 y.o. female with a hx of HTN, DM2, atrial fibrillation, HFrEF, aortic atherosclerosis, PFO. She was last seen 11/02/19 by Dr. Rockey Situ.  Admitted 10/2019 with new onset atrial fibrillation with RVR. She underwent TEE cardioversion on 10/12/19. TEE with LVEF 45-50%, no RWMA, no mural  apical thromus, global hypokinesis, right sided cardiac chambers normal with no evidence of pulmonary hypertension. 2D and color flow confirmed small PFO. Her descending thoracic aorta showed moderate mural aortic debris with no evidence of aneurysmal dilation or dissection. She was started on Amiodarone post-cardioversion.   Seen in clinic 10/23/19 maintaining. NSR. Her Amiodarone was decreased to 260m daily and her Metoprolol Tartrate switched to Toprol. ACE/ARB was deferred despite mildly reduced LVEF as she was concerned about polypharmacy. On 12/14/19 Toprol was subsequently transitioned back to Metoprolol Tartrate 1036mBID as she noted diarrhea on Toprol.  Reports symptoms for about a month. She was hopeful the symptoms would go away on their own. Reports palpitations, rapid heart beats. Tells me she has "real strong beats" sometimes. Also endorses dyspnea on exertion. Tells me she notices with as little activity as walking across the room.   Tells me her left leg is swollen "a little". Reports orthopnea and PND - tells me she lays down and it "feels heavy" and she has to get up as she feels short of breath.   HR at home more than 100bpm. Typically 100-120bpm. BP for the last 2 months has been 120-130/80.   She stopped taking the Amiodarone as she had some confusion regarding her medications. Has been off for approximately 1 month.   Reports some stress 1 month ago as her children mentioned possibly moving in with them.   Reports no bleeding complications.  Is not  certain whether she took her dose of Eliquis last night.  But denies other missed doses.  EKGs/Labs/Other Studies Reviewed:   The following studies were reviewed today: Echo TEE 10/12/19  1. Left ventricular ejection fraction, by visual estimation, is 50 to  55%. The left ventricle has normal function. There is no left ventricular  hypertrophy.   2. Left ventricular diastolic parameters are indeterminate.   3. The left ventricle  has no regional wall motion abnormalities.   4. Global right ventricle has normal systolic function.The right  ventricular size is normal. No increase in right ventricular wall  thickness.   5. Left atrial size was mildly dilated.   6. Right atrial size was normal.   7. The mitral valve is normal in structure. No evidence of mitral valve  regurgitation. No evidence of mitral stenosis.   8. The tricuspid valve is normal in structure. Tricuspid valve  regurgitation is not demonstrated.   9. The aortic valve is normal in structure. Aortic valve regurgitation is  not visualized. No evidence of aortic valve sclerosis or stenosis.  10. No LA or LA appendage thrombus. There was left atrium spontaneous  contrast noted.  11. Small PFO noted by color doppler.  12. Rhythm is 150 bpm, atrial fib/flutter.  13. Cardioversion to follow procedure, NSR restored.   Echo 10/07/19  1. Left ventricular ejection fraction, by visual estimation, is 45 to  50%. The left ventricle has mild to moderately decreased function. There  is no left ventricular hypertrophy.   2. Left ventricular diastolic parameters are indeterminate.   3. The left ventricle demonstrates global hypokinesis.   4. Global right ventricle has mildly reduced systolic function.The right  ventricular size is normal. Right vetricular wall thickness was not  assessed.   5. Left atrial size was moderately dilated.   6. Right atrial size was normal.   7. Large pleural effusion in the left lateral region.   8. Mild mitral annular calcification.   9. The mitral valve is degenerative. Mild mitral valve regurgitation.  10. The tricuspid valve is grossly normal.  11. The tricuspid valve is grossly normal. Tricuspid valve regurgitation  is mild-moderate.  12. The aortic valve is grossly normal. Aortic valve regurgitation is not  visualized.  13. Pulmonic regurgitation is mild.  14. The pulmonic valve was not well visualized. Pulmonic valve    regurgitation is mild.  15. The inferior vena cava is dilated in size with <50% respiratory  variability, suggesting right atrial pressure of 15 mmHg.   EKG:  EKG is ordered today.  The ekg ordered today demonstrates atrial fibrillation rate 118 bpm with stable T wave inversion in lateral leads  Recent Labs: 09/10/2019: TSH 2.490 10/06/2019: ALT 45; B Natriuretic Peptide 240.0 10/12/2019: BUN 21; Creatinine, Ser 0.87; Hemoglobin 14.0; Platelets 377; Potassium 3.9; Sodium 140  Recent Lipid Panel    Component Value Date/Time   CHOL 150 10/07/2019 0020   CHOL 165 09/10/2019 1453   TRIG 64 10/07/2019 0020   HDL 41 10/07/2019 0020   HDL 51 09/10/2019 1453   CHOLHDL 3.7 10/07/2019 0020   VLDL 13 10/07/2019 0020   LDLCALC 96 10/07/2019 0020   LDLCALC 91 09/10/2019 1453    Home Medications   Current Meds  Medication Sig  . apixaban (ELIQUIS) 5 MG TABS tablet Take 1 tablet (5 mg total) by mouth 2 (two) times daily.  . Biotin 10 MG TABS Take 10 mg by mouth daily.  . metFORMIN (GLUCOPHAGE) 850 MG tablet  TAKE 1 TABLET (850 MG TOTAL) BY MOUTH 2 (TWO) TIMES DAILY WITH A MEAL.  . metoprolol tartrate (LOPRESSOR) 100 MG tablet Take 1 tablet (100 mg total) by mouth 2 (two) times daily.    Review of Systems      Review of Systems  Constitution: Negative for chills, fever and malaise/fatigue.  Cardiovascular: Positive for dyspnea on exertion, irregular heartbeat, leg swelling and palpitations. Negative for chest pain, near-syncope, orthopnea and syncope.  Respiratory: Positive for shortness of breath. Negative for cough and wheezing.   Gastrointestinal: Negative for melena, nausea and vomiting.  Genitourinary: Negative for hematuria.  Neurological: Negative for dizziness, light-headedness and weakness.   All other systems reviewed and are otherwise negative except as noted above.  Physical Exam    VS:  BP 122/90 (BP Location: Left Arm, Patient Position: Sitting, Cuff Size: Normal)   Pulse  (!) 118   Ht '5\' 3"'  (1.6 m)   Wt 142 lb 8 oz (64.6 kg)   SpO2 98%   BMI 25.24 kg/m  , BMI Body mass index is 25.24 kg/m. GEN: Well nourished, well developed, in no acute distress. HEENT: normal. Neck: Supple, no JVD, carotid bruits, or masses. Cardiac: Irregularly irregular, no murmurs, rubs, or gallops. No clubbing, cyanosis, edema.  Radials/PT 2+ and equal bilaterally.  Respiratory:  Respirations regular and unlabored, clear to auscultation bilaterally. GI: Soft, nontender, nondistended. MS: No deformity or atrophy. Skin: Warm and dry, no rash. Neuro:  Strength and sensation are intact. Psych: Normal affect.  Accessory Clinical Findings    ECG personally reviewed by me today -recurrence of atrial fibrillation since last tracing-atrial fibrillation 118 bpm with stable T wave inversion in lateral leads- no acute changes.  Assessment & Plan    1. Atrial fibrillation -admitted 10/2019 with new onset atrial fibrillation.  Underwent TEE and cardioversion 10/12/2019.  Last seen 10/23/2019 in clinic maintaining sinus rhythm.  Reports recurrence of palpitations and irregular heartbeats associate with shortness of breath, lower extremity edema, orthopnea for 1 month.  She has been off her amiodarone for 1 month that she had some confusion regarding her medication.  Anticipate this is the preceding cause of her recurrent atrial fibrillation.  She is unclear whether she missed a dose of Eliquis last night.  Resume amiodarone.  Continue metoprolol 100 mg twice daily.  Hesitant to increase as she has been bradycardic when in NSR.  Continue Eliquis 5 mg twice daily.  Follow-up in 1 week for repeat EKG to assess whether she has converted on amiodarone.  If not, will plan for cardioversion.  2. Chronic anticoagulation -she is unclear whether she missed her dose of Eliquis last night.  As such, if she opts for cardioversion will require TEE.  Denies bleeding complications. Continue Eliquis 43m BID.   Discussed importance of making sure she does not miss any doses.  3. On Amiodarone therapy - Stopped for 1 month due to medication confusion.  Discussed with Dr. GRockey Situtoday.  States start amiodarone 400 mg daily for 5 days then transition to amiodarone 200 mg daily.   09/10/19 ALT 15 AST 17. 10/06/19 ALT 45, AST 46. 09/10/19 TSH 2.49.  Consider repeat c-Met at follow-up.  4. HFrEF -transthoracic echo 10/07/2019 with LVEF 45-50%.  Echo TEE 10/12/2019 LVEF 50-55%.  Mildly reduced LVEF likely tachycardia mediated in the setting of new onset atrial fibrillation.  As above, will likely require TEE for cardioversion.  If not, consider repeat TTE for monitoring.  She has recurrent heart failure symptoms  with shortness of breath, edema in the setting of recurrent atrial fibrillation.  Start Lasix 20 mg daily for 1 week.  Plan for CMET at visit in 1 week.  5. Aortic atherosclerosis -Reports no chest pain, pressure, tightness.  EKG today with stable T wave inversion in lateral leads.  No aspirin secondary to chronic anticoagulation.  Continue metoprolol. Consider statin at follow-up.  Disposition: Follow up in 1 week(s) with Dr. Rockey Situ or APP.    Loel Dubonnet, NP 01/15/2020, 2:32 PM

## 2020-01-15 ENCOUNTER — Other Ambulatory Visit: Payer: Self-pay

## 2020-01-15 ENCOUNTER — Encounter: Payer: Self-pay | Admitting: Family

## 2020-01-15 ENCOUNTER — Ambulatory Visit (INDEPENDENT_AMBULATORY_CARE_PROVIDER_SITE_OTHER): Payer: PPO | Admitting: Family

## 2020-01-15 VITALS — BP 122/90 | HR 118 | Ht 63.0 in | Wt 142.5 lb

## 2020-01-15 DIAGNOSIS — I4891 Unspecified atrial fibrillation: Secondary | ICD-10-CM | POA: Diagnosis not present

## 2020-01-15 MED ORDER — FUROSEMIDE 20 MG PO TABS: 20.0000 mg | ORAL_TABLET | Freq: Every day | ORAL | 0 refills | Status: DC

## 2020-01-15 MED ORDER — AMIODARONE HCL 200 MG PO TABS: ORAL_TABLET | ORAL | 1 refills | Status: DC

## 2020-01-15 NOTE — Patient Instructions (Addendum)
Medication Instructions:  Your provider has recommended you make the following change in your medication:   1)START Amiodarone 400 mg daily for 5 days. Then take Amiodarone 200 mg daily. An Rx has been sent to your pharmacy.  2) START Lasix 20 mg daily for 7 days. An Rx has been sent to your pharmacy.  It is very important to not miss ANY doses of Eliquis prior to your cardioversion. Please call our office if you miss a dose.  *If you need a refill on your cardiac medications before your next appointment, please call your pharmacy*  Lab Work: None ordered  If you have labs (blood work) drawn today and your tests are completely normal, you will receive your results only by: Marland Kitchen MyChart Message (if you have MyChart) OR . A paper copy in the mail If you have any lab test that is abnormal or we need to change your treatment, we will call you to review the results.   Testing/Procedures: Your EKG today showed atrial fibrillation.   Your physician has recommended that you have a Cardioversion (DCCV). Electrical Cardioversion uses a jolt of electricity to your heart either through paddles or wired patches attached to your chest. This is a controlled, usually prescheduled, procedure. Defibrillation is done under light anesthesia in the hospital, and you usually go home the day of the procedure. This is done to get your heart back into a normal rhythm. You are not awake for the procedure. Please see the instruction sheet given to you today.   Follow-Up: At [facility], you and your health needs are our priority.  As part of our continuing mission to provide you with exceptional heart care, we have created designated Provider Care Teams.  These Care Teams include your primary Cardiologist (physician) and Advanced Practice Providers (APPs -  Physician Assistants and Nurse Practitioners) who all work together to provide you with the care you need, when you need it.  We recommend signing up for the  patient portal called "MyChart".  Sign up information is provided on this After Visit Summary.  MyChart is used to connect with patients for Virtual Visits (Telemedicine).  Patients are able to view lab/test results, encounter notes, upcoming appointments, etc.  Non-urgent messages can be sent to your provider as well.   To learn more about what you can do with MyChart, go to [email or web address].    Your next appointment:   On 01/21/20 or 01/22/20  The format for your next appointment:   In Person  Provider:   You may see Ida Rogue, MD or one of the following Advanced Practice Providers on your designated Care Team:    Murray Hodgkins, NP  Christell Faith, PA-C  Laurann Montana, NP  Marrianne Mood, PA-C  Other Instructions   Your shortness of breath and swelling are likely due to your atrial fibrillation (irregular heart beat) causing you to hold onto fluid. Recommend elevating your legs when sitting. Recommend adding an extra pillow to sleep at night. Recommend deep breathing when you feel short of breath: breath in for 4 counts, hold your breath for 4 counts, breath out like you're breathing through a straw for 8 counts.

## 2020-01-22 ENCOUNTER — Other Ambulatory Visit: Payer: Self-pay

## 2020-01-22 ENCOUNTER — Ambulatory Visit (INDEPENDENT_AMBULATORY_CARE_PROVIDER_SITE_OTHER): Payer: PPO | Admitting: Family

## 2020-01-22 ENCOUNTER — Encounter: Payer: Self-pay | Admitting: Family

## 2020-01-22 VITALS — BP 124/70 | HR 100 | Ht 63.0 in | Wt 137.2 lb

## 2020-01-22 DIAGNOSIS — Z79899 Other long term (current) drug therapy: Secondary | ICD-10-CM

## 2020-01-22 DIAGNOSIS — I4891 Unspecified atrial fibrillation: Secondary | ICD-10-CM | POA: Diagnosis not present

## 2020-01-22 DIAGNOSIS — Z7901 Long term (current) use of anticoagulants: Secondary | ICD-10-CM | POA: Diagnosis not present

## 2020-01-22 DIAGNOSIS — I700 Atherosclerosis of aorta: Secondary | ICD-10-CM | POA: Diagnosis not present

## 2020-01-22 DIAGNOSIS — Z01810 Encounter for preprocedural cardiovascular examination: Secondary | ICD-10-CM | POA: Diagnosis not present

## 2020-01-22 DIAGNOSIS — I4819 Other persistent atrial fibrillation: Secondary | ICD-10-CM

## 2020-01-22 DIAGNOSIS — I5020 Unspecified systolic (congestive) heart failure: Secondary | ICD-10-CM | POA: Diagnosis not present

## 2020-01-22 MED ORDER — FUROSEMIDE 20 MG PO TABS: 20.0000 mg | ORAL_TABLET | ORAL | 1 refills | Status: DC

## 2020-01-22 MED ORDER — APIXABAN 5 MG PO TABS: 5.0000 mg | ORAL_TABLET | Freq: Two times a day (BID) | ORAL | 3 refills | Status: DC

## 2020-01-22 NOTE — Progress Notes (Addendum)
Office Visit    Patient Name: Vanessa Gentry Date of Encounter: 01/22/2020  Primary Care Provider:  Ronnell Freshwater, NP Primary Cardiologist:  Ida Rogue, MD Electrophysiologist:  None   Chief Complaint    Vanessa Gentry is a 86 y.o. female with a hx of HTN, DM2, atrial fibrillation, HFrEF, aortic atherosclerosis, PFO  presents today for atrial fibrillation.    Past Medical History    Past Medical History:  Diagnosis Date  . Arthritis   . Diabetes mellitus without complication ([place])   . Dysrhythmia   . GERD (gastroesophageal reflux disease)   . Hypertension   . Pneumonia    Past Surgical History:  Procedure Laterality Date  . ABDOMINAL HYSTERECTOMY    . BREAST BIOPSY Left 2011   NEG  . CARDIOVERSION N/A 10/12/2019   Procedure: CARDIOVERSION;  Surgeon: Minna Merritts, MD;  Location: [facility] ORS;  Service: Cardiovascular;  Laterality: N/A;  . CATARACT EXTRACTION W/PHACO Right 03/13/2015   Procedure: CATARACT EXTRACTION PHACO AND INTRAOCULAR LENS PLACEMENT ([place]);  Surgeon: Leandrew Koyanagi, MD;  Location: [facility] ORS;  Service: Ophthalmology;  Laterality: Right;  [place]  2:40  AP 20.3   CDE 32.41 cassette lot #   [id number]  . CHOLECYSTECTOMY    . ORIF ANKLE FRACTURE    . TEE WITHOUT CARDIOVERSION N/A 10/12/2019   Procedure: TRANSESOPHAGEAL ECHOCARDIOGRAM (TEE);  Surgeon: Minna Merritts, MD;  Location: [facility] ORS;  Service: Cardiovascular;  Laterality: N/A;  . UPPER GI ENDOSCOPY      Allergies  Allergies  Allergen Reactions  . Sulfa Antibiotics Other (See Comments)  . Iodinated Diagnostic Agents Rash    Betadine ok Betadine ok    History of Present Illness    Vanessa Gentry is a 86 y.o. female with a hx of HTN, DM2, atrial fibrillation, HFrEF, aortic atherosclerosis, PFO  last seen 01/15/20.  Admitted 10/2019 with new onset atrial fibrillation with RVR. She underwent TEE cardioversion on 10/12/19. TEE with LVEF 45-50%, no RWMA, no mural apical thromus, global  hypokinesis, right sided cardiac chambers normal with no evidence of pulmonary hypertension. 2D and color flow confirmed small PFO. Her descending thoracic aorta showed moderate mural aortic debris with no evidence of aneurysmal dilation or dissection. She was started on Amiodarone post-cardioversion.    Seen in clinic 10/23/19 maintaining. NSR. Her Amiodarone was decreased to 246m daily and her Metoprolol Tartrate switched to Toprol. ACE/ARB was deferred despite mildly reduced LVEF as she was concerned about polypharmacy. On 12/14/19 Toprol was subsequently transitioned back to Metoprolol Tartrate 1063mBID as she noted diarrhea on Toprol.  Last seen 01/15/2020 for palpitations, rapid heart rates, dyspnea on exertion for approximately a month.  Associated with some orthopnea.  She had stopped taking amiodarone about a month prior due to confusion regarding her medications.  She noted she may have missed 1 dose of Eliquis.  She was found to be in atrial fibrillation.  Her amiodarone was reduced with loading dose and cardioversion conversation was initiated.  She was started on Lasix 20 mg daily for 1 week for fluid retention.  Seen today for 1 week follow-up. Tells me the last two days have been "good". She's been able to sleep without orthopnea and having markedly reduced dyspnea on exertion.  She has not noticed any recurrent swelling her legs. Continues to be in atrial fibrillation but overall feeling much improved.  Denies missed doses of Eliquis.  EKGs/Labs/Other Studies Reviewed:   The following studies were reviewed today:  Echo TEE 10/12/19  1. Left ventricular ejection fraction, by visual estimation, is 50 to  55%. The left ventricle has normal function. There is no left ventricular  hypertrophy.   2. Left ventricular diastolic parameters are indeterminate.   3. The left ventricle has no regional wall motion abnormalities.   4. Global right ventricle has normal systolic function.The right    ventricular size is normal. No increase in right ventricular wall  thickness.   5. Left atrial size was mildly dilated.   6. Right atrial size was normal.   7. The mitral valve is normal in structure. No evidence of mitral valve  regurgitation. No evidence of mitral stenosis.   8. The tricuspid valve is normal in structure. Tricuspid valve  regurgitation is not demonstrated.   9. The aortic valve is normal in structure. Aortic valve regurgitation is  not visualized. No evidence of aortic valve sclerosis or stenosis.  10. No LA or LA appendage thrombus. There was left atrium spontaneous  contrast noted.  11. Small PFO noted by color doppler.  12. Rhythm is 150 bpm, atrial fib/flutter.  13. Cardioversion to follow procedure, NSR restored.    Echo 10/07/19  1. Left ventricular ejection fraction, by visual estimation, is 45 to  50%. The left ventricle has mild to moderately decreased function. There  is no left ventricular hypertrophy.   2. Left ventricular diastolic parameters are indeterminate.   3. The left ventricle demonstrates global hypokinesis.   4. Global right ventricle has mildly reduced systolic function.The right  ventricular size is normal. Right vetricular wall thickness was not  assessed.   5. Left atrial size was moderately dilated.   6. Right atrial size was normal.   7. Large pleural effusion in the left lateral region.   8. Mild mitral annular calcification.   9. The mitral valve is degenerative. Mild mitral valve regurgitation.  10. The tricuspid valve is grossly normal.  11. The tricuspid valve is grossly normal. Tricuspid valve regurgitation  is mild-moderate.  12. The aortic valve is grossly normal. Aortic valve regurgitation is not  visualized.  13. Pulmonic regurgitation is mild.  14. The pulmonic valve was not well visualized. Pulmonic valve  regurgitation is mild.  15. The inferior vena cava is dilated in size with <50% respiratory  variability,  suggesting right atrial pressure of 15 mmHg.    EKG:  EKG is ordered today.  The ekg ordered today demonstrates atrial fibrillation 100 bpm with staable TWI in lateral leads and prolonged QT 368 (QTc 474).   Recent Labs: 09/10/2019: TSH 2.490 10/06/2019: ALT 45; B Natriuretic Peptide 240.0 10/12/2019: BUN 21; Creatinine, Ser 0.87; Hemoglobin 14.0; Platelets 377; Potassium 3.9; Sodium 140  Recent Lipid Panel    Component Value Date/Time   CHOL 150 10/07/2019 0020   CHOL 165 09/10/2019 1453   TRIG 64 10/07/2019 0020   HDL 41 10/07/2019 0020   HDL 51 09/10/2019 1453   CHOLHDL 3.7 10/07/2019 0020   VLDL 13 10/07/2019 0020   LDLCALC 96 10/07/2019 0020   LDLCALC 91 09/10/2019 1453    Home Medications   Current Meds  Medication Sig  . amiodarone (PACERONE) 200 MG tablet Take 1 tablet (200 mg total) by mouth daily.  Marland Kitchen apixaban (ELIQUIS) 5 MG TABS tablet Take 1 tablet (5 mg total) by mouth 2 (two) times daily.  . Biotin 10 MG TABS Take 10 mg by mouth daily.  . metFORMIN (GLUCOPHAGE) 850 MG tablet TAKE 1 TABLET (850 MG TOTAL)  BY MOUTH 2 (TWO) TIMES DAILY WITH A MEAL.  . metoprolol tartrate (LOPRESSOR) 100 MG tablet Take 1 tablet (100 mg total) by mouth 2 (two) times daily.      Review of Systems      Review of Systems  Constitution: Negative for chills, fever and malaise/fatigue.  Cardiovascular: Positive for dyspnea on exertion and palpitations. Negative for chest pain, irregular heartbeat, leg swelling, near-syncope, orthopnea and syncope.  Respiratory: Negative for cough, shortness of breath and wheezing.   Gastrointestinal: Negative for melena, nausea and vomiting.  Genitourinary: Negative for hematuria.  Neurological: Negative for dizziness, light-headedness and weakness.   All other systems reviewed and are otherwise negative except as noted above.  Physical Exam    VS:  BP 124/70 (BP Location: Left Arm, Patient Position: Sitting, Cuff Size: Normal)   Pulse 100   Ht _0   (1.6 m)   Wt 137 lb 4 oz (62.3 kg)   SpO2 95%   BMI 24.31 kg/m  , BMI Body mass index is 24.31 kg/m. GEN: Well nourished, well developed, in no acute distress. HEENT: normal. Neck: Supple, no JVD, carotid bruits, or masses. Cardiac: irregularly irregular, no murmurs, rubs, or gallops. No clubbing, cyanosis, edema.  Radials/DP/PT 2+ and equal bilaterally.  Respiratory:  Respirations regular and unlabored, clear to auscultation bilaterally. GI: Soft, nontender, nondistended, BS + x 4. MS: No deformity or atrophy. Skin: Warm and dry, no rash. Neuro:  Strength and sensation are intact. Psych: Normal affect.  Assessment & Plan    1. Atrial fibrillation -admitted 2/20 for new onset atrial fibrillation.  Underwent TEE and cardioversion to/5/21.  Seen in clinic 01/15/2020 reporting recurrent palpitations and atrial fibrillation in the setting of being off of amiodarone due to confusion due to medications.  Amiodarone resumed including loading dose of 500 mg x 1 week.  Continue amiodarone 200 mg daily.  Continue metoprolol 100 mg twice daily.  Will not increase as she has been bradycardic when in normal sinus rhythm.  Continue Eliquis 5 mg twice daily-discussed importance of not missing doses.  Plan for cardioversion in 2 weeks as she will have completed 3 weeks of uninterrupted anticoagulation. Risks and benefits of cardioversion were discussed during clinic visit and she was agreeable to proceed.  2. Chronic anticoagulation -in the setting of PAF.  Continue Eliquis 5 mg twice daily.  Denies bleeding complications.  Discussed importance of making sure he does not miss any doses prior to cardioversion.  CBC today for monitoring. 3. On amiodarone therapy -previously stopped for 1 month due to medication confusion.  She has resumed amiodarone is presently on 200 mg daily.  January 2014 normal liver enzymes with elevated liver enzymes noted on 10/06/2019.  CMET today for monitoring. Normal TSH 09/2019.   4. HFrEF -TTE 10/07/2019 LVEF 45-50%.  Echo TEE 10/12/2019 LVEF 50-55%.  Mildly reduced LVEF likely tachycardia mediated.  Recurrent heart failure symptoms in the setting of recurrent atrial fibrillation.  She is already diuresed 5 pounds.  Transition Lasix to 20 mg every other day.  C-Met today for monitoring. 5. Aortic atherosclerosis -reports no anginal symptoms.  Stable T wave inversion noted in lateral leads on EKG.  No aspirin secondary to chronic anticoagulation.  Continue metoprolol.  Likely will benefit from statin-will discuss at follow-up.  Disposition: Plan for echocardiogram in 2 weeks for completion of 3 weeks of uninterrupted anticoagulation.  Follow up 1 week after cardioversion with Dr. Arta Bruce or APP  Loel Dubonnet, NP 01/22/2020, 10:30 AM

## 2020-01-22 NOTE — Patient Instructions (Addendum)
Medication Instructions:  Your physician has recommended you make the following change in your medication:   CONTINUE Amiodarone 200mg  daily  START Furosemide (Lasix) 20mg  (one tablet) every other day  CONTINUE Eliquis *if you miss a dose please call our office*  *If you need a refill on your cardiac medications before your next appointment, please call your pharmacy*   Lab Work: Your physician recommends that you return for lab work today: CMET, CBC  Your physician recommends that you return for lab work in: On the May 28th between 8 am - 11 am present to the [employer] for [employer] testing. This is a drive through testing.   If you have labs (blood work) drawn today and your tests are completely normal, you will receive your results only by: Marland Kitchen MyChart Message (if you have MyChart) OR . A paper copy in the mail If you have any lab test that is abnormal or we need to change your treatment, we will call you to review the results.   Testing/Procedures: YOur EKG today shows atrial fibrillation.   Your physician has recommended that you have a Cardioversion (DCCV). Electrical Cardioversion uses a jolt of electricity to your heart either through paddles or wired patches attached to your chest. This is a controlled, usually prescheduled, procedure. Defibrillation is done under light anesthesia in the hospital, and you usually go home the day of the procedure. This is done to get your heart back into a normal rhythm. You are not awake for the procedure. Please see the instruction sheet given to you today.  Follow-Up: At [facility], you and your health needs are our priority.  As part of our continuing mission to provide you with exceptional heart care, we have created designated Provider Care Teams.  These Care Teams include your primary Cardiologist (physician) and Advanced Practice Providers (APPs -  Physician Assistants and Nurse Practitioners) who all work together to provide  you with the care you need, when you need it.  We recommend signing up for the patient portal called "MyChart".  Sign up information is provided on this After Visit Summary.  MyChart is used to connect with patients for Virtual Visits (Telemedicine).  Patients are able to view lab/test results, encounter notes, upcoming appointments, etc.  Non-urgent messages can be sent to your provider as well.   To learn more about what you can do with MyChart, go to [email or web address].    Your next appointment:    Follow up in office 1 week after cardioversion with Dr. Rockey Situ or Advanced Practice Provider.   Other Instructions  You are scheduled for a Cardioversion on __June 2nd______________ with Dr.___Agor-etang________ Please arrive at the [place] [facility] at _6:30 am________ a.m. on the day of your procedure.  DIET INSTRUCTIONS:  Nothing to eat or drink after midnight except your medications with a sip of water.  1) Labs: ___See above_______________  2) Medications:  YOU MAY TAKE ALL of your remaining medications with a small amount of water.  3) Must have a responsible person to drive you home.  4) Bring a current list of your medications and current insurance cards.    If you have any questions after you get home, please call the office at [phone]

## 2020-01-23 ENCOUNTER — Telehealth: Payer: Self-pay

## 2020-01-23 LAB — CBC
Hematocrit: 40.9 % (ref 34.0–46.6)
Hemoglobin: 13.6 g/dL (ref 11.1–15.9)
MCH: 29.2 pg (ref 26.6–33.0)
MCHC: 33.3 g/dL (ref 31.5–35.7)
MCV: 88 fL (ref 79–97)
Platelets: 241 10*3/uL (ref 150–450)
RBC: 4.65 x10E6/uL (ref 3.77–5.28)
RDW: 16.2 % — ABNORMAL HIGH (ref 11.7–15.4)
WBC: 7.6 10*3/uL (ref 3.4–10.8)

## 2020-01-23 LAB — COMPREHENSIVE METABOLIC PANEL
ALT: 22 IU/L (ref 0–32)
AST: 17 IU/L (ref 0–40)
Albumin/Globulin Ratio: 1.9 (ref 1.2–2.2)
Albumin: 4.1 g/dL (ref 3.6–4.6)
Alkaline Phosphatase: 80 IU/L (ref 48–121)
BUN/Creatinine Ratio: 16 (ref 12–28)
BUN: 19 mg/dL (ref 8–27)
Bilirubin Total: 1.5 mg/dL — ABNORMAL HIGH (ref 0.0–1.2)
CO2: 24 mmol/L (ref 20–29)
Calcium: 9 mg/dL (ref 8.7–10.3)
Chloride: 97 mmol/L (ref 96–106)
Creatinine, Ser: 1.18 mg/dL — ABNORMAL HIGH (ref 0.57–1.00)
GFR calc Af Amer: 49 mL/min/{1.73_m2} — ABNORMAL LOW (ref >59)
GFR calc non Af Amer: 42 mL/min/{1.73_m2} — ABNORMAL LOW (ref >59)
Globulin, Total: 2.2 g/dL (ref 1.5–4.5)
Glucose: 127 mg/dL — ABNORMAL HIGH (ref 65–99)
Potassium: 4.4 mmol/L (ref 3.5–5.2)
Sodium: 140 mmol/L (ref 134–144)
Total Protein: 6.3 g/dL (ref 6.0–8.5)

## 2020-01-23 NOTE — Telephone Encounter (Signed)
Call to patient to review labs.    Pt verbalized understanding and has no further questions at this time.    Advised pt to call for any further questions or concerns.  No further orders.   

## 2020-01-23 NOTE — Addendum Note (Signed)
Addended by: Loel Dubonnet on: 01/23/2020 08:08 AM   Modules accepted: Orders, SmartSet

## 2020-01-23 NOTE — Telephone Encounter (Signed)
-----   Message from Loel Dubonnet, NP sent at 01/23/2020  8:06 AM EDT ----- CBC with no evidence of anemia or infection. Normal electrolytes and liver function. Kidney function mildly declined - likely dehydration and recent Lasix. We decreased her Lasix dose at recent office visit. Will plan to get repeat labs during her upcoming cardioversion in a couple weeks.

## 2020-02-01 ENCOUNTER — Other Ambulatory Visit: Payer: Self-pay

## 2020-02-01 ENCOUNTER — Other Ambulatory Visit
Admission: RE | Admit: 2020-02-01 | Discharge: 2020-02-01 | Disposition: A | Discharge status: Home / Self Care | Payer: PPO | Source: Ambulatory Visit | Attending: Cardiology | Admitting: Cardiology

## 2020-02-01 DIAGNOSIS — Z01812 Encounter for preprocedural laboratory examination: Secondary | ICD-10-CM | POA: Diagnosis not present

## 2020-02-01 DIAGNOSIS — Z20822 Contact with and (suspected) exposure to covid-19: Secondary | ICD-10-CM | POA: Insufficient documentation

## 2020-02-02 LAB — SARS CORONAVIRUS 2 (TAT 6-24 HRS): SARS Coronavirus 2: NEGATIVE

## 2020-02-06 ENCOUNTER — Other Ambulatory Visit: Payer: Self-pay

## 2020-02-06 ENCOUNTER — Ambulatory Visit: Payer: PPO | Admitting: Anesthesiology

## 2020-02-06 ENCOUNTER — Ambulatory Visit
Admission: RE | Admit: 2020-02-06 | Discharge: 2020-02-06 | Disposition: A | Discharge status: Home / Self Care | Payer: PPO | Attending: Cardiology | Admitting: Cardiology

## 2020-02-06 ENCOUNTER — Encounter
Admission: RE | Disposition: A | Discharge status: Home / Self Care | Payer: Self-pay | Source: Home / Self Care | Attending: Cardiology

## 2020-02-06 DIAGNOSIS — F17200 Nicotine dependence, unspecified, uncomplicated: Secondary | ICD-10-CM | POA: Diagnosis not present

## 2020-02-06 DIAGNOSIS — I4891 Unspecified atrial fibrillation: Secondary | ICD-10-CM | POA: Insufficient documentation

## 2020-02-06 DIAGNOSIS — I480 Paroxysmal atrial fibrillation: Secondary | ICD-10-CM | POA: Diagnosis not present

## 2020-02-06 HISTORY — PX: CARDIOVERSION: SHX1299

## 2020-02-06 LAB — GLUCOSE, CAPILLARY: Glucose-Capillary: 121 mg/dL — ABNORMAL HIGH (ref 70–99)

## 2020-02-06 SURGERY — CARDIOVERSION
Anesthesia: General

## 2020-02-06 MED ORDER — SODIUM CHLORIDE 0.9 % IV SOLN: INTRAVENOUS | Status: DC | PRN

## 2020-02-06 MED ORDER — PROPOFOL 10 MG/ML IV BOLUS
INTRAVENOUS | Status: DC | PRN
  Administered 2020-02-06: 20 mg via INTRAVENOUS
  Administered 2020-02-06: 50 mg via INTRAVENOUS

## 2020-02-06 MED ORDER — EPHEDRINE SULFATE 50 MG/ML IJ SOLN: INTRAMUSCULAR | Status: DC | PRN

## 2020-02-06 NOTE — Transfer of Care (Signed)
Immediate Anesthesia Transfer of Care Note  Patient: Vanessa Gentry  Procedure(s) Performed: CARDIOVERSION (N/A )  Patient Location: Cath Lab  Anesthesia Type:General  Level of Consciousness: drowsy and responds to stimulation  Airway & Oxygen Therapy: Patient Spontanous Breathing and Patient connected to face mask oxygen  Post-op Assessment: Report given to RN and Post -op Vital signs reviewed and stable  Post vital signs: Reviewed and stable  Last Vitals:  Vitals Value Taken Time  BP 103/64 02/06/20 0739  Temp    Pulse 57 02/06/20 0740  Resp 21 02/06/20 0740  SpO2 100 % 02/06/20 0740    Last Pain:  Vitals:   02/06/20 0659  TempSrc: Oral         Complications: No apparent anesthesia complications

## 2020-02-06 NOTE — Anesthesia Preprocedure Evaluation (Signed)
Anesthesia Evaluation  Patient identified by MRN, date of birth, ID band Patient awake    Reviewed: Allergy & Precautions, NPO status , Patient's Chart, lab work & pertinent test results  History of Anesthesia Complications Negative for: history of anesthetic complications  Airway Mallampati: II  TM Distance: >3 FB Neck ROM: Full    Dental no notable dental hx. (+) Teeth Intact   Pulmonary neg pulmonary ROS, shortness of breath, neg sleep apnea, neg COPD, Patient abstained from smoking.Not current smoker,  Dyspnea since her dysrythmia began   Pulmonary exam normal breath sounds clear to auscultation       Cardiovascular Exercise Tolerance: Good METShypertension, Pt. on medications (-) CAD and (-) Past MI + dysrhythmias Atrial Fibrillation  Rhythm:Irregular Rate:Normal - Systolic murmurs TTE [id number]: 1. Left ventricular ejection fraction, by visual estimation, is 45 to  50%. The left ventricle has mild to moderately decreased function. There  is no left ventricular hypertrophy.  2. Left ventricular diastolic parameters are indeterminate.  3. The left ventricle demonstrates global hypokinesis.  4. Global right ventricle has mildly reduced systolic function.The right  ventricular size is normal. Right vetricular wall thickness was not  assessed.  5. Left atrial size was moderately dilated.  6. Right atrial size was normal.  7. Large pleural effusion in the left lateral region.  8. Mild mitral annular calcification.  9. The mitral valve is degenerative. Mild mitral valve regurgitation.  10. The tricuspid valve is grossly normal.  11. The tricuspid valve is grossly normal. Tricuspid valve regurgitation  is mild-moderate.  12. The aortic valve is grossly normal. Aortic valve regurgitation is not  visualized.  13. Pulmonic regurgitation is mild.  14. The pulmonic valve was not well visualized. Pulmonic valve  regurgitation  is mild.  15. The inferior vena cava is dilated in size with <50% respiratory  variability, suggesting right atrial pressure of 15 mmHg.    Neuro/Psych negative neurological ROS  negative psych ROS   GI/Hepatic GERD  ,(+)     (-) substance abuse  , Patient notes dysphagia to liquids after her prior TEE, still present but slowly improving   Endo/Other  diabetes  Renal/GU negative Renal ROS     Musculoskeletal  (+) Arthritis ,   Abdominal   Peds  Hematology   Anesthesia Other Findings Past Medical History: No date: Arthritis No date: Diabetes mellitus without complication (HCC) No date: Dysrhythmia No date: GERD (gastroesophageal reflux disease) No date: Hypertension No date: Pneumonia  Reproductive/Obstetrics                             Anesthesia Physical  Anesthesia Plan  ASA: II  Anesthesia Plan: General   Post-op Pain Management:    Induction: Intravenous  PONV Risk Score and Plan: 3 and Ondansetron and TIVA  Airway Management Planned: Natural Airway  Additional Equipment: None  Intra-op Plan:   Post-operative Plan:   Informed Consent: I have reviewed the patients History and Physical, chart, labs and discussed the procedure including the risks, benefits and alternatives for the proposed anesthesia with the patient or authorized representative who has indicated his/her understanding and acceptance.     Dental advisory given  Plan Discussed with: CRNA and Surgeon  Anesthesia Plan Comments: (Discussed risks of anesthesia with patient, including PONV, conversion to GETA and its associated risks including sore throat, lip/dental damage. Rare risks discussed as well, such as cardiorespiratory sequelae, aspiration. Patient understands.)  Anesthesia Quick Evaluation  

## 2020-02-06 NOTE — H&P (Signed)
INTERVAL HISTORY: The patient reports today for DCCV  due to atrial fibrillation  Recent note from the office reviewed and no changes noted  EKG personally reviewed by myself on todays visit Shows atrial fibrillation  Signed, Kate Sable, M.D. 02/06/20 [place], [place]

## 2020-02-06 NOTE — Procedures (Signed)
Cardioversion procedure note For atrial fibrillation.  Procedure Details:  Consent: Risks of procedure as well as the alternatives and risks of each were explained to the (patient/caregiver).  Consent for procedure obtained.  Time Out: Verified patient identification, verified procedure, site/side was marked, verified correct patient position, special equipment/implants available, medications/allergies/relevent history reviewed, required imaging and test results available.  Performed  Patient placed on cardiac monitor, pulse oximetry, supplemental oxygen as necessary.   Sedation given: propofol per anesthesia team  Pacer pads placed anterior and posterior chest.   Successfully Cardioverted 1 time.   Cardioverted at  [place]. Synchronized biphasic Converted to NSR   Evaluation: Findings: Post procedure EKG shows: NSR Complications: None Patient did tolerate procedure well.  Time Spent Directly with the Patient:  35 minutes   Kate Sable, M.D.

## 2020-02-06 NOTE — Anesthesia Postprocedure Evaluation (Signed)
Anesthesia Post Note  Patient: Vanessa Gentry  Procedure(s) Performed: CARDIOVERSION (N/A )  Patient location during evaluation: Specials Recovery Anesthesia Type: General Level of consciousness: awake and alert Pain management: pain level controlled Vital Signs Assessment: post-procedure vital signs reviewed and stable Respiratory status: spontaneous breathing, nonlabored ventilation, respiratory function stable and patient connected to nasal cannula oxygen Cardiovascular status: blood pressure returned to baseline and stable Postop Assessment: no apparent nausea or vomiting Anesthetic complications: no     Last Vitals:  Vitals:   02/06/20 0800 02/06/20 0810  BP: (!) 109/57   Pulse:  (!) 59  Resp: (!) 21 20  Temp:    SpO2:  96%    Last Pain:  Vitals:   02/06/20 0810  TempSrc:   PainSc: 0-No pain                 Arita Miss

## 2020-02-14 ENCOUNTER — Other Ambulatory Visit: Payer: Self-pay

## 2020-02-14 ENCOUNTER — Ambulatory Visit: Payer: PPO | Admitting: Cardiology

## 2020-02-14 ENCOUNTER — Ambulatory Visit: Payer: PPO | Admitting: Family

## 2020-02-14 ENCOUNTER — Encounter: Payer: Self-pay | Admitting: Family

## 2020-02-14 VITALS — BP 148/64 | HR 57 | Ht 62.0 in | Wt 129.5 lb

## 2020-02-14 DIAGNOSIS — I420 Dilated cardiomyopathy: Secondary | ICD-10-CM

## 2020-02-14 DIAGNOSIS — Z7901 Long term (current) use of anticoagulants: Secondary | ICD-10-CM

## 2020-02-14 DIAGNOSIS — I700 Atherosclerosis of aorta: Secondary | ICD-10-CM | POA: Diagnosis not present

## 2020-02-14 DIAGNOSIS — I4891 Unspecified atrial fibrillation: Secondary | ICD-10-CM | POA: Diagnosis not present

## 2020-02-14 DIAGNOSIS — I4819 Other persistent atrial fibrillation: Secondary | ICD-10-CM

## 2020-02-14 DIAGNOSIS — I5020 Unspecified systolic (congestive) heart failure: Secondary | ICD-10-CM

## 2020-02-14 DIAGNOSIS — Z79899 Other long term (current) drug therapy: Secondary | ICD-10-CM

## 2020-02-14 NOTE — Progress Notes (Signed)
Office Visit    Patient Name: Vanessa Gentry Date of Encounter: 02/14/2020  Primary Care Provider:  Ronnell Freshwater, NP Primary Cardiologist:  Ida Rogue, MD Electrophysiologist:  None   Chief Complaint    Vanessa Gentry is a 86 y.o. female with a hx of HTN, DM2, atrial fibrillation, HFrEF, aortic atherosclerosis, PFO  presents today for follow-up after cardioversion  Past Medical History    Past Medical History:  Diagnosis Date  . Arthritis   . Diabetes mellitus without complication ([place])   . Dysrhythmia   . GERD (gastroesophageal reflux disease)   . Hypertension   . Pneumonia    Past Surgical History:  Procedure Laterality Date  . ABDOMINAL HYSTERECTOMY    . BREAST BIOPSY Left 2011   NEG  . CARDIOVERSION N/A 10/12/2019   Procedure: CARDIOVERSION;  Surgeon: Minna Merritts, MD;  Location: [facility];  Service: Cardiovascular;  Laterality: N/A;  . CARDIOVERSION N/A 02/06/2020   Procedure: CARDIOVERSION;  Surgeon: Kate Sable, MD;  Location: [facility];  Service: Cardiovascular;  Laterality: N/A;  . CATARACT EXTRACTION W/PHACO Right 03/13/2015   Procedure: CATARACT EXTRACTION PHACO AND INTRAOCULAR LENS PLACEMENT ([place]);  Surgeon: Leandrew Koyanagi, MD;  Location: [facility];  Service: Ophthalmology;  Laterality: Right;  [place]  2:40  AP 20.3   CDE 32.41 cassette lot #   [id number]  . CHOLECYSTECTOMY    . ORIF ANKLE FRACTURE    . TEE WITHOUT CARDIOVERSION N/A 10/12/2019   Procedure: TRANSESOPHAGEAL ECHOCARDIOGRAM (TEE);  Surgeon: Minna Merritts, MD;  Location: [facility];  Service: Cardiovascular;  Laterality: N/A;  . UPPER GI ENDOSCOPY      Allergies  Allergies  Allergen Reactions  . Metoprolol Succinate [Metoprolol] Diarrhea    Tolerates the tartrate version   . Sulfa Antibiotics Other (See Comments)    Unknown reaction  . Iodinated Diagnostic Agents Rash    Betadine ok    History of Present Illness    Vanessa Gentry is a 86 y.o. female with a hx of  HTN, DM2, atrial fibrillation, HFrEF, aortic atherosclerosis, PFO  last seen 01/22/2020  Admitted 10/2019 with new onset atrial fibrillation with RVR. She underwent TEE cardioversion on 10/12/19. TEE with LVEF 45-50%, no RWMA, no mural apical thromus, global hypokinesis, right sided cardiac chambers normal with no evidence of pulmonary hypertension. 2D and color flow confirmed small PFO. Her descending thoracic aorta showed moderate mural aortic debris with no evidence of aneurysmal dilation or dissection. She was started on Amiodarone post-cardioversion.    Seen in clinic 10/23/19 maintaining. NSR. Her Amiodarone was decreased to 239m daily and her Metoprolol Tartrate switched to Toprol. ACE/ARB was deferred despite mildly reduced LVEF as she was concerned about polypharmacy. On 12/14/19 Toprol was subsequently transitioned back to Metoprolol Tartrate 1075mBID as she noted diarrhea on Toprol.  Last seen 01/15/2020 for palpitations, rapid heart rates, dyspnea on exertion for approximately a month.  Associated with some orthopnea.  She had stopped taking amiodarone about a month prior due to confusion regarding her medications.  She noted she may have missed 1 dose of Eliquis.  She was found to be in atrial fibrillation.  Her amiodarone was reduced with loading dose and cardioversion conversation was initiated.  She was started on Lasix 20 mg daily for 1 week for fluid retention.  Seen 518/21 with improvement in orthopnea, DOE on Lasix.  Continues to be in atrial fibrillation and was recommended for cardioversion.  Underwent successful cardioversion 02/06/2020.  One  time since procedure she felt short of breath.  Tells me overall her shortness of breath is resolved.  Reports no lower extremity edema. HR at home when checked typically is 57-59 BPM.  Reports no lightheadedness, dizziness, near syncope.  Reports some loose stools.  Discussed that this could be due to the Lasix.  We will plan to stop Lasix as she no  longer is having diastolic heart failure symptoms.   EKGs/Labs/Other Studies Reviewed:   The following studies were reviewed today: Echo TEE 10/12/19  1. Left ventricular ejection fraction, by visual estimation, is 50 to  55%. The left ventricle has normal function. There is no left ventricular  hypertrophy.   2. Left ventricular diastolic parameters are indeterminate.   3. The left ventricle has no regional wall motion abnormalities.   4. Global right ventricle has normal systolic function.The right  ventricular size is normal. No increase in right ventricular wall  thickness.   5. Left atrial size was mildly dilated.   6. Right atrial size was normal.   7. The mitral valve is normal in structure. No evidence of mitral valve  regurgitation. No evidence of mitral stenosis.   8. The tricuspid valve is normal in structure. Tricuspid valve  regurgitation is not demonstrated.   9. The aortic valve is normal in structure. Aortic valve regurgitation is  not visualized. No evidence of aortic valve sclerosis or stenosis.  10. No LA or LA appendage thrombus. There was left atrium spontaneous  contrast noted.  11. Small PFO noted by color doppler.  12. Rhythm is 150 bpm, atrial fib/flutter.  13. Cardioversion to follow procedure, NSR restored.    Echo 10/07/19  1. Left ventricular ejection fraction, by visual estimation, is 45 to  50%. The left ventricle has mild to moderately decreased function. There  is no left ventricular hypertrophy.   2. Left ventricular diastolic parameters are indeterminate.   3. The left ventricle demonstrates global hypokinesis.   4. Global right ventricle has mildly reduced systolic function.The right  ventricular size is normal. Right vetricular wall thickness was not  assessed.   5. Left atrial size was moderately dilated.   6. Right atrial size was normal.   7. Large pleural effusion in the left lateral region.   8. Mild mitral annular calcification.   9.  The mitral valve is degenerative. Mild mitral valve regurgitation.  10. The tricuspid valve is grossly normal.  11. The tricuspid valve is grossly normal. Tricuspid valve regurgitation  is mild-moderate.  12. The aortic valve is grossly normal. Aortic valve regurgitation is not  visualized.  13. Pulmonic regurgitation is mild.  14. The pulmonic valve was not well visualized. Pulmonic valve  regurgitation is mild.  15. The inferior vena cava is dilated in size with <50% respiratory  variability, suggesting right atrial pressure of 15 mmHg.    EKG:  EKG is ordered today.  The ekg ordered today demonstrates SB 57 bpm with single PVC and stable TWI in anterolateral leads with prolonged QT 368 (QTc 474). Mildly prolonged QT is stable compared to previous.   Recent Labs: 09/10/2019: TSH 2.490 10/06/2019: B Natriuretic Peptide 240.0 01/22/2020: ALT 22; BUN 19; Creatinine, Ser 1.18; Hemoglobin 13.6; Platelets 241; Potassium 4.4; Sodium 140  Recent Lipid Panel    Component Value Date/Time   CHOL 150 10/07/2019 0020   CHOL 165 09/10/2019 1453   TRIG 64 10/07/2019 0020   HDL 41 10/07/2019 0020   HDL 51 09/10/2019 1453   CHOLHDL  3.7 10/07/2019 0020   VLDL 13 10/07/2019 0020   LDLCALC 96 10/07/2019 0020   LDLCALC 91 09/10/2019 1453    Home Medications   Current Meds  Medication Sig  . acetaminophen (TYLENOL) 500 MG tablet Take 500 mg by mouth every 6 (six) hours as needed for moderate pain or headache.  Marland Kitchen amiodarone (PACERONE) 200 MG tablet Take 1 tablet (200 mg total) by mouth daily.  Marland Kitchen apixaban (ELIQUIS) 5 MG TABS tablet Take 1 tablet (5 mg total) by mouth 2 (two) times daily.  . Biotin 10 MG TABS Take 10 mg by mouth daily.  . cetirizine (ZYRTEC) 10 MG tablet Take 10 mg by mouth daily as needed for allergies.  . metFORMIN (GLUCOPHAGE) 850 MG tablet TAKE 1 TABLET (850 MG TOTAL) BY MOUTH 2 (TWO) TIMES DAILY WITH A MEAL.  . metoprolol tartrate (LOPRESSOR) 100 MG tablet Take 1 tablet (100 mg  total) by mouth 2 (two) times daily.  . Naphazoline-Glycerin (CLEAR EYES MAX REDNESS RELIEF OP) Place 1 drop into both eyes daily as needed (itchy/irritated eyes).  . [DISCONTINUED] furosemide (LASIX) 20 MG tablet Take 1 tablet (20 mg total) by mouth every other day.      Review of Systems      Review of Systems  Constitutional: Negative for chills, fever and malaise/fatigue.  Cardiovascular: Negative for chest pain, dyspnea on exertion, irregular heartbeat, leg swelling, near-syncope, orthopnea, palpitations and syncope.  Respiratory: Negative for cough, shortness of breath and wheezing.   Gastrointestinal: Negative for melena, nausea and vomiting.  Genitourinary: Negative for hematuria.  Neurological: Negative for dizziness, light-headedness and weakness.   All other systems reviewed and are otherwise negative except as noted above.  Physical Exam    VS:  BP (!) 148/64 (BP Location: Left Arm, Patient Position: Sitting, Cuff Size: Normal)   Pulse (!) 57   Ht _0  (1.575 m)   Wt 129 lb 8 oz (58.7 kg)   SpO2 98%   BMI 23.69 kg/m  , BMI Body mass index is 23.69 kg/m. GEN: Well nourished, well developed, in no acute distress. HEENT: normal. Neck: Supple, no JVD, carotid bruits, or masses. Cardiac:bradycardia, RRR, no murmurs, rubs, or gallops. No clubbing, cyanosis, edema.  Radials/DP/PT 2+ and equal bilaterally.  Respiratory:  Respirations regular and unlabored, clear to auscultation bilaterally. GI: Soft, nontender, nondistended, BS + x 4. MS: No deformity or atrophy. Skin: Warm and dry, no rash. Neuro:  Strength and sensation are intact. Psych: Normal affect.  Assessment & Plan    1. Atrial fibrillation -Admitted 2/20 for new onset atrial fibrillation.  Underwent TEE and cardioversion to/5/21.  Seen in clinic 01/15/2020 reporting recurrent palpitations and atrial fibrillation in the setting of being off of amiodarone due to confusion due to medications. Amiodarone resumed and  DCCV 02/06/20. EKG today shows she is maintaining SB. HR in the 50s with no lightheadedness, dizziness, near syncope. Continue Metoprolol130m BID, Amiodarone 2051mdaily, Eliquis 31m29mID. Could consider transition to Toprol in the future for easier medication adherence.    2. Chronic anticoagulation - In the setting of PAF.  Continue Eliquis 5 mg twice daily.  Denies bleeding complications. 01/22/20 Hb 13.6. Based on her weight today she does qualify for reduced dosing (age, <60kg). However, she has been >60 kg on all recent visits and reports baseline weight of approx 140lb. As we are stopping diuretic today and she is only 1.3kg from full dose, continue 31mg34mD and reassess weight at follow up.  3. On amiodarone therapy- Maintaining NSR. Labs 01/22/20 with normal liver function. Normal TSH 09/2019. Continue Amiodarone 275m daily, QTc remains mildly prolonged but stable. May consider reduced dose in future but will maintain present dose as she is only one week post cardioversion.    4. HFrEF -TTE 10/07/2019 LVEF 45-50%.  Echo TEE 10/12/2019 LVEF 50-55%.  Mildly reduced LVEF likely tachycardia mediated.  Resolution of heart failure symptoms since restoration of NSR.  Stop Lasix.  Be met for monitoring.  5. Aortic atherosclerosis - Reports no anginal symptoms.  Stable T wave inversion noted in anterolateral leads on EKG.  No aspirin secondary to chronic anticoagulation.  Continue metoprolol.  Consider addition of statin at follow up. She is very hesitant regarding additional medications today.   Disposition:  Follow up on August 16th with Dr. GArta Bruceas previously scheduled.   CLoel Dubonnet NP 02/14/2020, 12:32 PM

## 2020-02-14 NOTE — Patient Instructions (Addendum)
Medication Instructions:  Your physician has recommended you make the following change in your medication:   STOP Furosemide (Lasix)  CONTINUE Amiodarone and Metoprolol.   *If you need a refill on your cardiac medications before your next appointment, please call your pharmacy*   Lab Work: Your physician recommends that have lab work today: BMET  If you have labs (blood work) drawn today and your tests are completely normal, you will receive your results only by: Marland Kitchen MyChart Message (if you have MyChart) OR . A paper copy in the mail If you have any lab test that is abnormal or we need to change your treatment, we will call you to review the results.  Testing/Procedures: Your EKG today shows sinus bradycardia. This is a regular heart rhythm. Your cardioversion continues to keep you out of atrial fibrillation.   Follow-Up: At [facility], you and your health needs are our priority.  As part of our continuing mission to provide you with exceptional heart care, we have created designated Provider Care Teams.  These Care Teams include your primary Cardiologist (physician) and Advanced Practice Providers (APPs -  Physician Assistants and Nurse Practitioners) who all work together to provide you with the care you need, when you need it.  We recommend signing up for the patient portal called "MyChart".  Sign up information is provided on this After Visit Summary.  MyChart is used to connect with patients for Virtual Visits (Telemedicine).  Patients are able to view lab/test results, encounter notes, upcoming appointments, etc.  Non-urgent messages can be sent to your provider as well.   To learn more about what you can do with MyChart, go to [email or web address].    Your next appointment:   On August 16th with Dr. Rockey Situ as previously scheduled.   Other Instructions  Check your heart rate one per week. If it is more than 90 bpm, recommend you call us.   Your signs of abnormal heart  rhythm were leg swelling and trouble breathing with weight gain - so if you notice those things recommend you call us. Call if you gain 3 pounds overnight or 5 pounds in 1 week, recommend you call us.    If stopping the Furosemide (Lasix) does not improve your loose stool, recommend you talk with your primary care provider. Recommend eating lots of fiber like whole grains, green leafy vegetables.

## 2020-02-15 ENCOUNTER — Telehealth: Payer: Self-pay

## 2020-02-15 LAB — BASIC METABOLIC PANEL
BUN/Creatinine Ratio: 13 (ref 12–28)
BUN: 12 mg/dL (ref 8–27)
CO2: 22 mmol/L (ref 20–29)
Calcium: 9.1 mg/dL (ref 8.7–10.3)
Chloride: 102 mmol/L (ref 96–106)
Creatinine, Ser: 0.94 mg/dL (ref 0.57–1.00)
GFR calc Af Amer: 64 mL/min/{1.73_m2} (ref >59)
GFR calc non Af Amer: 56 mL/min/{1.73_m2} — ABNORMAL LOW (ref >59)
Glucose: 82 mg/dL (ref 65–99)
Potassium: 4.5 mmol/L (ref 3.5–5.2)
Sodium: 140 mmol/L (ref 134–144)

## 2020-02-15 NOTE — Telephone Encounter (Signed)
-----   Message from Loel Dubonnet, NP sent at 02/15/2020  7:45 AM EDT ----- Kidney function improved compared to previous. Normal electrolytes. Great results!

## 2020-02-15 NOTE — Telephone Encounter (Signed)
Attempted to call patient. [facility] 02/15/2020

## 2020-02-26 NOTE — Telephone Encounter (Signed)
Detailed message left with results. No further orders at this time.

## 2020-03-18 ENCOUNTER — Telehealth: Payer: Self-pay

## 2020-03-18 NOTE — Telephone Encounter (Signed)
Confirmed and screened for 03-20-20 ov. 

## 2020-03-18 NOTE — Telephone Encounter (Signed)
LVM CONFIRMING PT APPT.AR °

## 2020-03-20 ENCOUNTER — Ambulatory Visit (INDEPENDENT_AMBULATORY_CARE_PROVIDER_SITE_OTHER): Payer: PPO | Admitting: Nurse Practitioner

## 2020-03-20 ENCOUNTER — Other Ambulatory Visit: Payer: Self-pay

## 2020-03-20 ENCOUNTER — Encounter: Payer: Self-pay | Admitting: Nurse Practitioner

## 2020-03-20 VITALS — BP 135/53 | HR 60 | Temp 97.3°F | Resp 16 | Ht 62.0 in | Wt 126.2 lb

## 2020-03-20 DIAGNOSIS — E1165 Type 2 diabetes mellitus with hyperglycemia: Secondary | ICD-10-CM | POA: Diagnosis not present

## 2020-03-20 DIAGNOSIS — I10 Essential (primary) hypertension: Secondary | ICD-10-CM

## 2020-03-20 DIAGNOSIS — I480 Paroxysmal atrial fibrillation: Secondary | ICD-10-CM | POA: Diagnosis not present

## 2020-03-20 DIAGNOSIS — R300 Dysuria: Secondary | ICD-10-CM | POA: Diagnosis not present

## 2020-03-20 DIAGNOSIS — Z0001 Encounter for general adult medical examination with abnormal findings: Secondary | ICD-10-CM

## 2020-03-20 LAB — POCT GLYCOSYLATED HEMOGLOBIN (HGB A1C): Hemoglobin A1C: 6.1 % — AB (ref 4.0–5.6)

## 2020-03-20 NOTE — Progress Notes (Signed)
[facility] [place], [place] [address]  Internal MEDICINE  Office Visit Note  Patient Name: Vanessa Gentry  [id number]  [id number]  Date of Service: 04/02/2020   Pt is here for routine health maintenance examination  Chief Complaint  Patient presents with  . Medicare Wellness  . Diabetes  . Gastroesophageal Reflux  . Quality Metric Gaps    TDAP, Eye exam     The patient is here for health maintenance exam. She is diabetic. Blood sugars are well managed. HgbA1c is 6.1 today. She is due to have diabetic eye exam. She suffers from atrial fibrillation. She sees cardiology for this. Condition has been stable. She is due to have routine, lasting labs done. The patient states that she feels good and has no new concerns or complaints today.     Current Medication: Outpatient Encounter Medications as of 03/20/2020  Medication Sig  . acetaminophen (TYLENOL) 500 MG tablet Take 500 mg by mouth every 6 (six) hours as needed for moderate pain or headache.  Marland Kitchen amiodarone (PACERONE) 200 MG tablet Take 1 tablet (200 mg total) by mouth daily.  Marland Kitchen apixaban (ELIQUIS) 5 MG TABS tablet Take 1 tablet (5 mg total) by mouth 2 (two) times daily.  . Biotin 10 MG TABS Take 10 mg by mouth daily.  . cetirizine (ZYRTEC) 10 MG tablet Take 10 mg by mouth daily as needed for allergies.  . metFORMIN (GLUCOPHAGE) 850 MG tablet TAKE 1 TABLET (850 MG TOTAL) BY MOUTH 2 (TWO) TIMES DAILY WITH A MEAL.  . metoprolol tartrate (LOPRESSOR) 100 MG tablet Take 1 tablet (100 mg total) by mouth 2 (two) times daily.  . Naphazoline-Glycerin (CLEAR EYES MAX REDNESS RELIEF OP) Place 1 drop into both eyes daily as needed (itchy/irritated eyes).   No facility-administered encounter medications on file as of 03/20/2020.    Surgical History: Past Surgical History:  Procedure Laterality Date  . ABDOMINAL HYSTERECTOMY    . BREAST BIOPSY Left 2011   NEG  . CARDIOVERSION N/A 10/12/2019   Procedure:  CARDIOVERSION;  Surgeon: Minna Merritts, MD;  Location: [facility] ORS;  Service: Cardiovascular;  Laterality: N/A;  . CARDIOVERSION N/A 02/06/2020   Procedure: CARDIOVERSION;  Surgeon: Kate Sable, MD;  Location: [facility] ORS;  Service: Cardiovascular;  Laterality: N/A;  . CATARACT EXTRACTION W/PHACO Right 03/13/2015   Procedure: CATARACT EXTRACTION PHACO AND INTRAOCULAR LENS PLACEMENT ([place]);  Surgeon: Leandrew Koyanagi, MD;  Location: [facility] ORS;  Service: Ophthalmology;  Laterality: Right;  [place]  2:40  AP 20.3   CDE 32.41 cassette lot #   [id number]  . CHOLECYSTECTOMY    . ORIF ANKLE FRACTURE    . TEE WITHOUT CARDIOVERSION N/A 10/12/2019   Procedure: TRANSESOPHAGEAL ECHOCARDIOGRAM (TEE);  Surgeon: Minna Merritts, MD;  Location: [facility] ORS;  Service: Cardiovascular;  Laterality: N/A;  . UPPER GI ENDOSCOPY      Medical History: Past Medical History:  Diagnosis Date  . Arthritis   . Diabetes mellitus without complication ([place])   . Dysrhythmia   . GERD (gastroesophageal reflux disease)   . Hypertension   . Pneumonia     Family History: Family History  Problem Relation Age of Onset  . Heart failure Father   . Heart disease Sister   . Heart attack Sister   . Diabetes Brother   . Breast cancer Neg Hx       Review of Systems  Constitutional: Positive for fatigue. Negative for activity change, chills and unexpected weight change.  HENT: Negative for congestion, postnasal drip, rhinorrhea, sneezing and sore throat.   Respiratory: Negative for cough, chest tightness, shortness of breath and wheezing.   Cardiovascular: Negative for chest pain and palpitations.  Gastrointestinal: Negative for abdominal pain, constipation, diarrhea, nausea and vomiting.  Endocrine: Negative for cold intolerance, heat intolerance, polydipsia and polyuria.       Blood sugars doing well   Genitourinary: Negative for dysuria, frequency and urgency.  Musculoskeletal: Negative for arthralgias, back pain, joint  swelling and neck pain.  Skin: Negative for rash.  Allergic/Immunologic: Negative for environmental allergies.  Neurological: Negative for dizziness, tremors, numbness and headaches.  Hematological: Negative for adenopathy. Does not bruise/bleed easily.  Psychiatric/Behavioral: Negative for behavioral problems (Depression), sleep disturbance and suicidal ideas. The patient is not nervous/anxious.     Today's Vitals   03/20/20 1406  BP: (!) 135/53  Pulse: 60  Resp: 16  Temp: (!) 97.3 F (36.3 C)  SpO2: 96%  Weight: 126 lb 3.2 oz (57.2 kg)  Height: '5\' 2"'  (1.575 m)   Body mass index is 23.08 kg/m.  Physical Exam Vitals and nursing note reviewed.  Constitutional:      General: She is not in acute distress.    Appearance: Normal appearance. She is well-developed. She is not diaphoretic.  HENT:     Head: Normocephalic and atraumatic.     Nose: Nose normal.     Mouth/Throat:     Pharynx: No oropharyngeal exudate.  Eyes:     Pupils: Pupils are equal, round, and reactive to light.  Neck:     Thyroid: No thyromegaly.     Vascular: No JVD.     Trachea: No tracheal deviation.  Cardiovascular:     Rate and Rhythm: Normal rate and regular rhythm.     Pulses:          Dorsalis pedis pulses are 1+ on the right side and 1+ on the left side.       Posterior tibial pulses are 1+ on the right side and 1+ on the left side.     Heart sounds: Murmur heard.  No friction rub. No gallop.      Comments: Heart rhythm currently regular. There is soft, systolic murmur auscultated.  Pulmonary:     Effort: Pulmonary effort is normal. No respiratory distress.     Breath sounds: Normal breath sounds. No wheezing or rales.  Chest:     Chest wall: No tenderness.     Breasts:        Right: Normal. No swelling, bleeding, inverted nipple, mass, nipple discharge, skin change or tenderness.        Left: Normal. No swelling, bleeding, inverted nipple, mass, nipple discharge, skin change or tenderness.   Abdominal:     General: Bowel sounds are normal.     Palpations: Abdomen is soft.     Tenderness: There is no abdominal tenderness.  Musculoskeletal:        General: Normal range of motion.     Cervical back: Normal range of motion and neck supple.     Right foot: Normal range of motion. No deformity or bunion.     Left foot: Normal range of motion. No deformity or bunion.  Feet:     Right foot:     Protective Sensation: 10 sites tested. 10 sites sensed.     Skin integrity: Skin integrity normal.     Toenail Condition: Right toenails are normal.     Left foot:  Protective Sensation: 10 sites tested. 10 sites sensed.     Skin integrity: Skin integrity normal.     Toenail Condition: Left toenails are normal.  Lymphadenopathy:     Cervical: No cervical adenopathy.     Upper Body:     Right upper body: No axillary adenopathy.     Left upper body: No axillary adenopathy.  Skin:    General: Skin is warm and dry.  Neurological:     Mental Status: She is alert and oriented to person, place, and time.     Cranial Nerves: No cranial nerve deficit.  Psychiatric:        Mood and Affect: Mood normal.        Behavior: Behavior normal.        Thought Content: Thought content normal.        Judgment: Judgment normal.    Depression screen [facility] 2/9 03/20/2020 11/20/2019 09/20/2019 06/19/2019 03/15/2019  Decreased Interest 0 0 0 0 0  Down, Depressed, Hopeless 0 0 0 0 0  PHQ - 2 Score 0 0 0 0 0    Functional Status Survey: Is the patient deaf or have difficulty hearing?: No Does the patient have difficulty seeing, even when wearing glasses/contacts?: No Does the patient have difficulty concentrating, remembering, or making decisions?: Yes (memory) Does the patient have difficulty walking or climbing stairs?: No Does the patient have difficulty dressing or bathing?: No Does the patient have difficulty doing errands alone such as visiting a doctor's office or shopping?: No  MMSE - Mini  Mental State Exam 03/20/2020 03/15/2019  Orientation to time 5 5  Orientation to Place 5 5  Registration 3 3  Attention/ Calculation 5 5  Recall 3 3  Language- name 2 objects 2 2  Language- repeat 1 1  Language- follow 3 step command 3 3  Language- read & follow direction 1 1  Write a sentence 1 1  Copy design 1 1  Total score 30 30    Fall Risk  03/20/2020 11/20/2019 09/20/2019 06/19/2019 03/15/2019  Falls in the past year? 0 0 0 0 0  Number falls in past yr: - - - - -  Injury with Fall? - - - - -      LABS: Recent Results (from the past 2160 hour(s))  CBC     Status: Abnormal   Collection Time: 01/22/20 11:01 AM  Result Value Ref Range   WBC 7.6 3.4 - 10.8 x10E3/uL   RBC 4.65 3.77 - 5.28 x10E6/uL   Hemoglobin 13.6 11.1 - 15.9 g/dL   Hematocrit 40.9 34.0 - 46.6 %   MCV 88 79 - 97 fL   MCH 29.2 26.6 - 33.0 pg   MCHC 33.3 31 - 35 g/dL   RDW 16.2 (H) 11.7 - 15.4 %   Platelets 241 150 - 450 x10E3/uL  Comprehensive metabolic panel     Status: Abnormal   Collection Time: 01/22/20 11:01 AM  Result Value Ref Range   Glucose 127 (H) 65 - 99 mg/dL   BUN 19 8 - 27 mg/dL   Creatinine, Ser 1.18 (H) 0.57 - 1.00 mg/dL   GFR calc non Af Amer 42 (L) >59 mL/min/1.73   GFR calc Af Amer 49 (L) >59 mL/min/1.73    Comment: **[facility] currently reports eGFR in compliance with the current**   recommendations of the [employer]. [facility] will   update reporting as new guidelines are published from the NKF-ASN   Task force.  BUN/Creatinine Ratio 16 12 - 28   Sodium 140 134 - 144 mmol/L   Potassium 4.4 3.5 - 5.2 mmol/L   Chloride 97 96 - 106 mmol/L   CO2 24 20 - 29 mmol/L   Calcium 9.0 8.7 - 10.3 mg/dL   Total Protein 6.3 6.0 - 8.5 g/dL   Albumin 4.1 3.6 - 4.6 g/dL   Globulin, Total 2.2 1.5 - 4.5 g/dL   Albumin/Globulin Ratio 1.9 1.2 - 2.2   Bilirubin Total 1.5 (H) 0.0 - 1.2 mg/dL   Alkaline Phosphatase 80 48 - 121 IU/L    Comment:               **Please note reference  interval change**   AST 17 0 - 40 IU/L   ALT 22 0 - 32 IU/L  SARS CORONAVIRUS 2 (TAT 6-24 HRS) Nasopharyngeal Nasopharyngeal Swab     Status: None   Collection Time: 02/01/20 10:41 AM   Specimen: Nasopharyngeal Swab  Result Value Ref Range   SARS Coronavirus 2 NEGATIVE NEGATIVE    Comment: (NOTE) SARS-CoV-2 target nucleic acids are NOT DETECTED. The SARS-CoV-2 RNA is generally detectable in upper and lower respiratory specimens during the acute phase of infection. Negative results do not preclude SARS-CoV-2 infection, do not rule out co-infections with other pathogens, and should not be used as the sole basis for treatment or other patient management decisions. Negative results must be combined with clinical observations, patient history, and epidemiological information. The expected result is Negative. Fact Sheet for Patients: SugarRoll.be Fact Sheet for Healthcare Providers: https://www.woods-mathews.com/ This test is not yet approved or cleared by the [place] FDA and  has been authorized for detection and/or diagnosis of SARS-CoV-2 by FDA under an Emergency Use Authorization (EUA). This EUA will remain  in effect (meaning this test can be used) for the duration of the COVID-19 declaration under Section 56 4(b)(1) of the Act, 21 U.S.C. section 360bbb-3(b)(1), unless the authorization is terminated or revoked sooner. Performed at [facility], [place] [address], [place], [place] [address]   Glucose, capillary     Status: Abnormal   Collection Time: 02/06/20  7:12 AM  Result Value Ref Range   Glucose-Capillary 121 (H) 70 - 99 mg/dL    Comment: Glucose reference range applies only to samples taken after fasting for at least 8 hours.  Basic metabolic panel     Status: Abnormal   Collection Time: 02/14/20 11:06 AM  Result Value Ref Range   Glucose 82 65 - 99 mg/dL   BUN 12 8 - 27 mg/dL   Creatinine, Ser 0.94 0.57 - 1.00 mg/dL    GFR calc non Af Amer 56 (L) >59 mL/min/1.73   GFR calc Af Amer 64 >59 mL/min/1.73    Comment: **[facility] currently reports eGFR in compliance with the current**   recommendations of the [employer]. [facility] will   update reporting as new guidelines are published from the NKF-ASN   Task force.    BUN/Creatinine Ratio 13 12 - 28   Sodium 140 134 - 144 mmol/L   Potassium 4.5 3.5 - 5.2 mmol/L   Chloride 102 96 - 106 mmol/L   CO2 22 20 - 29 mmol/L   Calcium 9.1 8.7 - 10.3 mg/dL  Urinalysis, Routine w reflex microscopic     Status: Abnormal   Collection Time: 03/20/20  2:05 PM  Result Value Ref Range   Specific Gravity, UA 1.023 1.005 - 1.030   pH, UA 5.5 5.0 -  7.5   Color, UA Yellow Yellow   Appearance Ur Clear Clear   Leukocytes,UA Negative Negative   Protein,UA Trace Negative/Trace   Glucose, UA Negative Negative   Ketones, UA Trace (A) Negative   RBC, UA Negative Negative   Bilirubin, UA Negative Negative   Urobilinogen, Ur 1.0 0.2 - 1.0 mg/dL   Nitrite, UA Negative Negative   Microscopic Examination Comment     Comment: Microscopic not indicated and not performed.  POCT HgB A1C     Status: Abnormal   Collection Time: 03/20/20  2:35 PM  Result Value Ref Range   Hemoglobin A1C 6.1 (A) 4.0 - 5.6 %   HbA1c POC (<> result, manual entry)     HbA1c, POC (prediabetic range)     HbA1c, POC (controlled diabetic range)      Assessment/Plan: 1. Encounter for general adult medical examination with abnormal findings Annual health maintenance exam today. Lab slip given to have routine, fasting labs done.   2. Type 2 diabetes mellitus with hyperglycemia, without long-term current use of insulin (HCC) - POCT HgB A1C 6.1 today. Continue diabetic medication as prescribed. Refer for diabetic eye exam.  - Ambulatory referral to Ophthalmology  3. Paroxysmal atrial fibrillation (HCC) Stable. Continue regular visits with cardiology as scheduled.d   4. Essential  hypertension Stable. Continue bp medication as prescribed.   5. Dysuria - Urinalysis, Routine w reflex microscopic  General Counseling: Angelynn verbalizes understanding of the findings of todays visit and agrees with plan of treatment. I have discussed any further diagnostic evaluation that may be needed or ordered today. We also reviewed her medications today. she has been encouraged to call the office with any questions or concerns that should arise related to todays visit.    Counseling:  This patient was seen by Leretha Pol FNP Collaboration with Dr Lavera Guise as a part of collaborative care agreement  Orders Placed This Encounter  Procedures  . Urinalysis, Routine w reflex microscopic  . Ambulatory referral to Ophthalmology  . POCT HgB A1C     Total time spent: 45 Minutes  Time spent includes review of chart, medications, test results, and follow up plan with the patient.     Lavera Guise, MD  Internal Medicine

## 2020-03-21 LAB — URINALYSIS, ROUTINE W REFLEX MICROSCOPIC
Bilirubin, UA: NEGATIVE
Glucose, UA: NEGATIVE
Leukocytes,UA: NEGATIVE
Nitrite, UA: NEGATIVE
RBC, UA: NEGATIVE
Specific Gravity, UA: 1.023 (ref 1.005–1.030)
Urobilinogen, Ur: 1 mg/dL (ref 0.2–1.0)
pH, UA: 5.5 (ref 5.0–7.5)

## 2020-04-19 NOTE — Progress Notes (Signed)
Cardiology Office Note  Date:  04/21/2020   ID:  Taletha, Twiford [birth year], MRN [id number]  PCP:  Ronnell Freshwater, NP   Chief Complaint  Patient presents with  . other    6 month follow up. Meds reviewed by the pt.'s bottles. "doing well."     HPI:  86 y.o. female with history of  hypertension,  diabetes,  In the hospital February 2021 , new onset atrial fibrillation with RVR, mildly reduced EF of 45 to 50%  Secondary to challenging rate control efforts, underwent TEE cardioversion She presents today for follow-up of her atrial fibrillation, post hospital discharge  In the hospital February 2021 TEE performed October 12, 2019 followed by cardioversion   Estimated ejection fraction was 45%.  small  PFO descending thoracic aorta had moderate  mural aortic debris  Cardioversion  NSR restored Amiodarone was started post cardioversion for paroxysmal tachycardia/atrial fibrillation episodes  on Eliquis with metoprolol  Last seen 01/15/2020 for palpitations, rapid heart rates, dyspnea on exertion for approximately a month.   had stopped taking amiodarone about a month prior due to confusion regarding her medications.    found to be in atrial fibrillation.   Her amiodarone was reduced with loading dose and cardioversion conversation was initiated.  She was started on Lasix 20 mg daily for 1 week for fluid retention.  Seen 518/21 with improvement in orthopnea, DOE on Lasix.  Continues to be in atrial fibrillation and was recommended for cardioversion.   Underwent successful cardioversion 02/06/2020.  Gets a little tired Bradycardia today rate 53 bpm Sedentary  Has son in town, does not see him much, "he is busy" Does her own shopping  EKG personally reviewed by myself on todays visit Sinus bradycardia rate 53 bpm nonspecific T wave abnormality V3 through V6   PMH:   has a past medical history of Arthritis, Diabetes mellitus without complication ([place]), Dysrhythmia, GERD  (gastroesophageal reflux disease), Hypertension, and Pneumonia.  PSH:    Past Surgical History:  Procedure Laterality Date  . ABDOMINAL HYSTERECTOMY    . BREAST BIOPSY Left 2011   NEG  . CARDIOVERSION N/A 10/12/2019   Procedure: CARDIOVERSION;  Surgeon: Minna Merritts, MD;  Location: [facility] OR[facility];  Service: Cardiovascular;  Laterality: N/A;  . CARDIOVERSION N/A 02/06/2020   Procedure: CARDIOVERSION;  Surgeon: Kate Sable, MD;  Location: [facility] OR[facility];  Service: Cardiovascular;  Laterality: N/A;  . CATARACT EXTRACTION W/PHACO Right 03/13/2015   Procedure: CATARACT EXTRACTION PHACO AND INTRAOCULAR LENS PLACEMENT ([place]);  Surgeon: Leandrew Koyanagi, MD;  Location: [facility] ORS;  Service: Ophthalmology;  Laterality: Right;  [place]  2:40  AP 20.3   CDE 32.41 cassette lot #   [id number]  . CHOLECYSTECTOMY    . ORIF ANKLE FRACTURE    . TEE WITHOUT CARDIOVERSION N/A 10/12/2019   Procedure: TRANSESOPHAGEAL ECHOCARDIOGRAM (TEE);  Surgeon: Minna Merritts, MD;  Location: [facility] ORS;  Service: Cardiovascular;  Laterality: N/A;  . UPPER GI ENDOSCOPY      Current Outpatient Medications  Medication Sig Dispense Refill  . acetaminophen (TYLENOL) 500 MG tablet Take 500 mg by mouth every 6 (six) hours as needed for moderate pain or headache.    Marland Kitchen amiodarone (PACERONE) 200 MG tablet Take 1 tablet (200 mg total) by mouth daily. 90 tablet 3  . apixaban (ELIQUIS) 5 MG TABS tablet Take 1 tablet (5 mg total) by mouth 2 (two) times daily. 180 tablet 3  . Biotin 10 MG TABS Take 10 mg by mouth daily.    Marland Kitchen  cetirizine (ZYRTEC) 10 MG tablet Take 10 mg by mouth daily as needed for allergies.    . metFORMIN (GLUCOPHAGE) 850 MG tablet TAKE 1 TABLET (850 MG TOTAL) BY MOUTH 2 (TWO) TIMES DAILY WITH A MEAL. 180 tablet 1  . metoprolol tartrate (LOPRESSOR) 100 MG tablet Take 1 tablet (100 mg total) by mouth 2 (two) times daily. 180 tablet 3  . Naphazoline-Glycerin (CLEAR EYES MAX REDNESS RELIEF OP) Place 1 drop into both eyes daily as  needed (itchy/irritated eyes).     No current facility-administered medications for this visit.     Allergies:   Metoprolol succinate [metoprolol], Sulfa antibiotics, and Iodinated diagnostic agents   Social History:  The patient  reports that she has never smoked. She has never used smokeless tobacco. She reports that she does not drink alcohol and does not use drugs.   Family History:   family history includes Diabetes in her brother; Heart attack in her sister; Heart disease in her sister; Heart failure in her father.    Review of Systems: Review of Systems  Constitutional: Negative.   HENT: Negative.   Respiratory: Negative.   Cardiovascular: Negative.   Gastrointestinal: Negative.   Musculoskeletal: Negative.   Neurological: Negative.   Psychiatric/Behavioral: Negative.   All other systems reviewed and are negative.    PHYSICAL EXAM: VS:  BP (!) 144/60 (BP Location: Left Arm, Patient Position: Sitting, Cuff Size: Normal)   Pulse (!) 53   Ht 5\' 2"  (1.575 m)   Wt 125 lb 2 oz (56.8 kg)   SpO2 97%   BMI 22.89 kg/m  , BMI Body mass index is 22.89 kg/m. Constitutional:  oriented to person, place, and time. No distress.  Presenting in wheelchair, no family HENT:  Head: Grossly normal Eyes:  no discharge. No scleral icterus.  Neck: No JVD, no carotid bruits  Cardiovascular: Regular rate and rhythm, no murmurs appreciated Pulmonary/Chest: Clear to auscultation bilaterally, no wheezes or rails Abdominal: Soft.  no distension.  no tenderness.  Musculoskeletal: Normal range of motion Neurological:  normal muscle tone. Coordination normal. No atrophy Skin: Skin warm and dry Psychiatric: normal affect, pleasant    Recent Labs: 09/10/2019: TSH 2.490 10/06/2019: B Natriuretic Peptide 240.0 01/22/2020: ALT 22; Hemoglobin 13.6; Platelets 241 02/14/2020: BUN 12; Creatinine, Ser 0.94; Potassium 4.5; Sodium 140    Lipid Panel Lab Results  Component Value Date   CHOL 150  10/07/2019   HDL 41 10/07/2019   LDLCALC 96 10/07/2019   TRIG 64 10/07/2019      Wt Readings from Last 3 Encounters:  04/21/20 125 lb 2 oz (56.8 kg)  03/20/20 126 lb 3.2 oz (57.2 kg)  02/14/20 129 lb 8 oz (58.7 kg)       ASSESSMENT AND PLAN:  Problem List Items Addressed This Visit      Cardiology Problems   HFrEF (heart failure with reduced ejection fraction) ([place])   Relevant Orders   EKG 12-Lead   Aortic atherosclerosis (HCC)   Relevant Orders   EKG 12-Lead   Cardiomyopathy ([place]) - Primary   Relevant Orders   EKG 12-Lead   Essential hypertension   Relevant Orders   EKG 12-Lead    Other Visit Diagnoses    Persistent atrial fibrillation ([place])       Relevant Orders   EKG 12-Lead   Pleural effusion       Relevant Orders   EKG 12-Lead     A. Fib/flutter RVR Recommend she continue her amiodarone, metoprolol, anticoagulation with  Eliquis Prior breakthrough atrial fibrillation after she previously held amiodarone well  Essential hypertension Blood pressure is well controlled on today's visit. No changes made to the medications.  Aortic atherosclerosis Currently not on a statin Will follow cholesterol We will discussed with her, may have been held given polypharmacy and medication confusion   Total encounter time more than 25 minutes  Greater than 50% was spent in counseling and coordination of care with the patient    Signed, Esmond Plants, M.D., Ph.D. [place], [place]

## 2020-04-21 ENCOUNTER — Other Ambulatory Visit: Payer: Self-pay

## 2020-04-21 ENCOUNTER — Encounter: Payer: Self-pay | Admitting: Cardiovascular Disease

## 2020-04-21 ENCOUNTER — Ambulatory Visit: Payer: PPO | Admitting: Cardiovascular Disease

## 2020-04-21 VITALS — BP 144/60 | HR 53 | Ht 62.0 in | Wt 125.1 lb

## 2020-04-21 DIAGNOSIS — I420 Dilated cardiomyopathy: Secondary | ICD-10-CM | POA: Diagnosis not present

## 2020-04-21 DIAGNOSIS — J90 Pleural effusion, not elsewhere classified: Secondary | ICD-10-CM | POA: Diagnosis not present

## 2020-04-21 DIAGNOSIS — I10 Essential (primary) hypertension: Secondary | ICD-10-CM | POA: Diagnosis not present

## 2020-04-21 DIAGNOSIS — I700 Atherosclerosis of aorta: Secondary | ICD-10-CM

## 2020-04-21 DIAGNOSIS — I5020 Unspecified systolic (congestive) heart failure: Secondary | ICD-10-CM

## 2020-04-21 DIAGNOSIS — I4819 Other persistent atrial fibrillation: Secondary | ICD-10-CM

## 2020-04-21 NOTE — Patient Instructions (Signed)
Medication Instructions:  No changes  If you need a refill on your cardiac medications before your next appointment, please call your pharmacy.    Lab work: No new labs needed   If you have labs (blood work) drawn today and your tests are completely normal, you will receive your results only by: . MyChart Message (if you have MyChart) OR . A paper copy in the mail If you have any lab test that is abnormal or we need to change your treatment, we will call you to review the results.   Testing/Procedures: No new testing needed   Follow-Up: At [facility], you and your health needs are our priority.  As part of our continuing mission to provide you with exceptional heart care, we have created designated Provider Care Teams.  These Care Teams include your primary Cardiologist (physician) and Advanced Practice Providers (APPs -  Physician Assistants and Nurse Practitioners) who all work together to provide you with the care you need, when you need it.  . You will need a follow up appointment in 6 months  . Providers on your designated Care Team:   . Christopher Berge, NP . Ryan Dunn, PA-C . Jacquelyn Visser, PA-C  Any Other Special Instructions Will Be Listed Below (If Applicable).  COVID-19 Vaccine Information can be found at: [email or web address] For questions related to vaccine distribution or appointments, please email [email or web address] or call [phone].     

## 2020-05-09 ENCOUNTER — Telehealth: Payer: Self-pay | Admitting: Cardiovascular Disease

## 2020-05-09 NOTE — Telephone Encounter (Signed)
No answer. Left message to call back.   

## 2020-05-09 NOTE — Telephone Encounter (Signed)
Patient calling to check on status.

## 2020-05-09 NOTE — Telephone Encounter (Signed)
Patient returning call,  Please advise when able

## 2020-05-09 NOTE — Telephone Encounter (Signed)
Patient states her Eliquis is too expensive and would like a recommendation for an alternative. Please call.

## 2020-05-13 NOTE — Telephone Encounter (Signed)
Noted. No further action required. Closing this encounter.

## 2020-05-13 NOTE — Telephone Encounter (Signed)
Patient calling States she has changed her mind and has decided to stay on Eliquis medication

## 2020-07-18 DIAGNOSIS — H40053 Ocular hypertension, bilateral: Secondary | ICD-10-CM | POA: Diagnosis not present

## 2020-07-22 ENCOUNTER — Ambulatory Visit (INDEPENDENT_AMBULATORY_CARE_PROVIDER_SITE_OTHER): Payer: PPO | Admitting: Nurse Practitioner

## 2020-07-22 ENCOUNTER — Other Ambulatory Visit: Payer: Self-pay

## 2020-07-22 ENCOUNTER — Encounter: Payer: Self-pay | Admitting: Nurse Practitioner

## 2020-07-22 VITALS — BP 158/60 | HR 69 | Temp 97.5°F | Resp 16 | Ht 62.0 in | Wt 123.6 lb

## 2020-07-22 DIAGNOSIS — I700 Atherosclerosis of aorta: Secondary | ICD-10-CM | POA: Diagnosis not present

## 2020-07-22 DIAGNOSIS — E119 Type 2 diabetes mellitus without complications: Secondary | ICD-10-CM | POA: Diagnosis not present

## 2020-07-22 DIAGNOSIS — I10 Essential (primary) hypertension: Secondary | ICD-10-CM

## 2020-07-22 DIAGNOSIS — I480 Paroxysmal atrial fibrillation: Secondary | ICD-10-CM

## 2020-07-22 LAB — POCT GLYCOSYLATED HEMOGLOBIN (HGB A1C): Hemoglobin A1C: 5.5 % (ref 4.0–5.6)

## 2020-07-22 MED ORDER — METFORMIN HCL 850 MG PO TABS: 850.0000 mg | ORAL_TABLET | Freq: Every day | ORAL | 1 refills | Status: DC

## 2020-07-22 NOTE — Progress Notes (Signed)
[facility] [place], [place] [address]  Internal MEDICINE  Office Visit Note  Patient Name: Vanessa Gentry  [id number]  [id number]  Date of Service: 07/22/2020  Chief Complaint  Patient presents with  . Follow-up    questions about med  . Diabetes  . Gastroesophageal Reflux  . Hypertension  . Quality Metric Gaps    flu  . controlled substance form    reviewed with PT    The patient is here for follow up visit. She has been dong very well with blood sugars. Her HgbA1c is 5.5 today. She is taking her metformin 850mg  twice daily. She states that she had diabetic eye exam last Thursday. She got her flu shot at the end of September.  Her blood pressure is mildly elevated today. She states that she has not taken her blood pressure medication yet today. She does see Dr. Rockey Situ routinely for chronic a-fib and cardiomyopathy. She has occaisional headaches, but denies chest pain, chest pressure, or shortness of breath.       Current Medication: Outpatient Encounter Medications as of 07/22/2020  Medication Sig  . acetaminophen (TYLENOL) 500 MG tablet Take 500 mg by mouth every 6 (six) hours as needed for moderate pain or headache.  Marland Kitchen amiodarone (PACERONE) 200 MG tablet Take 1 tablet (200 mg total) by mouth daily.  Marland Kitchen apixaban (ELIQUIS) 5 MG TABS tablet Take 1 tablet (5 mg total) by mouth 2 (two) times daily.  . Biotin 10 MG TABS Take 10 mg by mouth daily.  . cetirizine (ZYRTEC) 10 MG tablet Take 10 mg by mouth daily as needed for allergies.  . metFORMIN (GLUCOPHAGE) 850 MG tablet Take 1 tablet (850 mg total) by mouth daily.  . Naphazoline-Glycerin (CLEAR EYES MAX REDNESS RELIEF OP) Place 1 drop into both eyes daily as needed (itchy/irritated eyes).  . [DISCONTINUED] metFORMIN (GLUCOPHAGE) 850 MG tablet TAKE 1 TABLET (850 MG TOTAL) BY MOUTH 2 (TWO) TIMES DAILY WITH A MEAL.  . metoprolol tartrate (LOPRESSOR) 100 MG tablet Take 1 tablet (100 mg total) by mouth 2  (two) times daily.   No facility-administered encounter medications on file as of 07/22/2020.    Surgical History: Past Surgical History:  Procedure Laterality Date  . ABDOMINAL HYSTERECTOMY    . BREAST BIOPSY Left 2011   NEG  . CARDIOVERSION N/A 10/12/2019   Procedure: CARDIOVERSION;  Surgeon: Minna Merritts, MD;  Location: [facility] ORS;  Service: Cardiovascular;  Laterality: N/A;  . CARDIOVERSION N/A 02/06/2020   Procedure: CARDIOVERSION;  Surgeon: Kate Sable, MD;  Location: [facility] ORS;  Service: Cardiovascular;  Laterality: N/A;  . CATARACT EXTRACTION W/PHACO Right 03/13/2015   Procedure: CATARACT EXTRACTION PHACO AND INTRAOCULAR LENS PLACEMENT ([place]);  Surgeon: Leandrew Koyanagi, MD;  Location: [facility] ORS;  Service: Ophthalmology;  Laterality: Right;  [place]  2:40  AP 20.3   CDE 32.41 cassette lot #   [id number]  . CHOLECYSTECTOMY    . ORIF ANKLE FRACTURE    . TEE WITHOUT CARDIOVERSION N/A 10/12/2019   Procedure: TRANSESOPHAGEAL ECHOCARDIOGRAM (TEE);  Surgeon: Minna Merritts, MD;  Location: [facility] ORS;  Service: Cardiovascular;  Laterality: N/A;  . UPPER GI ENDOSCOPY      Medical History: Past Medical History:  Diagnosis Date  . Arthritis   . Diabetes mellitus without complication ([place])   . Dysrhythmia   . GERD (gastroesophageal reflux disease)   . Hypertension   . Pneumonia     Family History: Family History  Problem Relation Age  of Onset  . Heart failure Father   . Heart disease Sister   . Heart attack Sister   . Diabetes Brother   . Breast cancer Neg Hx     Social History   Socioeconomic History  . Marital status: Divorced    Spouse name: Not on file  . Number of children: Not on file  . Years of education: Not on file  . Highest education level: Not on file  Occupational History  . Not on file  Tobacco Use  . Smoking status: Never Smoker  . Smokeless tobacco: Never Used  Vaping Use  . Vaping Use: Never used  Substance and Sexual Activity  . Alcohol use:  No  . Drug use: No  . Sexual activity: Not on file  Other Topics Concern  . Not on file  Social History Narrative  . Not on file   Social Determinants of Health   Financial Resource Strain:   . Difficulty of Paying Living Expenses: Not on file  Food Insecurity:   . Worried About Charity fundraiser in the Last Year: Not on file  . Ran Out of Food in the Last Year: Not on file  Transportation Needs:   . Lack of Transportation (Medical): Not on file  . Lack of Transportation (Non-Medical): Not on file  Physical Activity:   . Days of Exercise per Week: Not on file  . Minutes of Exercise per Session: Not on file  Stress:   . Feeling of Stress : Not on file  Social Connections:   . Frequency of Communication with Friends and Family: Not on file  . Frequency of Social Gatherings with Friends and Family: Not on file  . Attends Religious Services: Not on file  . Active Member of Clubs or Organizations: Not on file  . Attends Archivist Meetings: Not on file  . Marital Status: Not on file  Intimate Partner Violence:   . Fear of Current or Ex-Partner: Not on file  . Emotionally Abused: Not on file  . Physically Abused: Not on file  . Sexually Abused: Not on file      Review of Systems  Constitutional: Positive for fatigue. Negative for activity change, chills and unexpected weight change.  HENT: Negative for congestion, postnasal drip, rhinorrhea, sneezing and sore throat.   Respiratory: Negative for cough, chest tightness, shortness of breath and wheezing.   Cardiovascular: Negative for chest pain and palpitations.       Mildly elevated blood pressure today.   Gastrointestinal: Negative for abdominal pain, constipation, diarrhea, nausea and vomiting.  Endocrine: Negative for cold intolerance, heat intolerance, polydipsia and polyuria.       Blood sugars doing well   Musculoskeletal: Negative for arthralgias, back pain, joint swelling and neck pain.  Skin: Negative  for rash.  Allergic/Immunologic: Negative for environmental allergies.  Neurological: Positive for headaches. Negative for dizziness, tremors and numbness.  Hematological: Negative for adenopathy. Does not bruise/bleed easily.  Psychiatric/Behavioral: Negative for behavioral problems (Depression), sleep disturbance and suicidal ideas. The patient is not nervous/anxious.     Today's Vitals   07/22/20 0829  BP: (!) 158/60  Pulse: 69  Resp: 16  Temp: (!) 97.5 F (36.4 C)  SpO2: 99%  Weight: 123 lb 9.6 oz (56.1 kg)  Height: 5\' 2"  (1.575 m)   Body mass index is 22.61 kg/m.  Physical Exam Vitals and nursing note reviewed.  Constitutional:      General: She is not in acute distress.  Appearance: Normal appearance. She is well-developed. She is not diaphoretic.  HENT:     Head: Normocephalic and atraumatic.     Nose: Nose normal.     Mouth/Throat:     Pharynx: No oropharyngeal exudate.  Eyes:     Pupils: Pupils are equal, round, and reactive to light.  Neck:     Thyroid: No thyromegaly.     Vascular: No JVD.     Trachea: No tracheal deviation.  Cardiovascular:     Rate and Rhythm: Normal rate and regular rhythm.     Heart sounds: Normal heart sounds. No murmur heard.  No friction rub. No gallop.   Pulmonary:     Effort: Pulmonary effort is normal. No respiratory distress.     Breath sounds: Normal breath sounds. No wheezing or rales.  Chest:     Chest wall: No tenderness.  Abdominal:     Palpations: Abdomen is soft.  Musculoskeletal:        General: Normal range of motion.     Cervical back: Normal range of motion and neck supple.  Lymphadenopathy:     Cervical: No cervical adenopathy.  Skin:    General: Skin is warm and dry.  Neurological:     General: No focal deficit present.     Mental Status: She is alert and oriented to person, place, and time.     Cranial Nerves: No cranial nerve deficit.  Psychiatric:        Mood and Affect: Mood normal.        Behavior:  Behavior normal.        Thought Content: Thought content normal.        Judgment: Judgment normal.    Assessment/Plan: 1. Type 2 diabetes mellitus without complication, without long-term current use of insulin (HCC) - POCT HgB A1C 5.5 today. Reduce metformin 850 to once daily. Monitor blood sugars closely and recheck HgbA1c at her next visit.  - metFORMIN (GLUCOPHAGE) 850 MG tablet; Take 1 tablet (850 mg total) by mouth daily.  Dispense: 90 tablet; Refill: 1  2. Aortic atherosclerosis ([place]) Patient on eliquis. Will get carotid doppler for further evaluation.  - US Carotid Bilateral; Future  3. Paroxysmal atrial fibrillation (HCC) Continue meds as prescribed and regular visits with cardiology as scheduled.   4. Essential hypertension Generally stable, but has not taken medication today. Followed by Dr. Rockey Situ, cardiology   General Counseling: Vanessa Gentry verbalizes understanding of the findings of todays visit and agrees with plan of treatment. I have discussed any further diagnostic evaluation that may be needed or ordered today. We also reviewed her medications today. she has been encouraged to call the office with any questions or concerns that should arise related to todays visit.  Hypertension Counseling:   The following hypertensive lifestyle modification were recommended and discussed:  1. Limiting alcohol intake to less than 1 oz/day of ethanol:(24 oz of beer or 8 oz of wine or 2 oz of 100-proof whiskey). 2. Take baby ASA 81 mg daily. 3. Importance of regular aerobic exercise and losing weight. 4. Reduce dietary saturated fat and cholesterol intake for overall cardiovascular health. 5. Maintaining adequate dietary potassium, calcium, and magnesium intake. 6. Regular monitoring of the blood pressure. 7. Reduce sodium intake to less than 100 mmol/day (less than 2.3 gm of sodium or less than 6 gm of sodium choride)   This patient was seen by [place] with Dr  Lavera Guise as a part of collaborative care agreement  Orders Placed This Encounter  Procedures  . US Carotid Bilateral  . POCT HgB A1C    Meds ordered this encounter  Medications  . metFORMIN (GLUCOPHAGE) 850 MG tablet    Sig: Take 1 tablet (850 mg total) by mouth daily.    Dispense:  90 tablet    Refill:  1    Reduce dose to one times daily.    Order Specific Question:   Supervising Provider    Answer:   Lavera Guise [[id number]]    Total time spent: 30 Minutes  Time spent includes review of chart, medications, test results, and follow up plan with the patient.      Dr Lavera Guise Internal medicine

## 2020-07-30 ENCOUNTER — Other Ambulatory Visit: Payer: Self-pay

## 2020-07-30 DIAGNOSIS — E119 Type 2 diabetes mellitus without complications: Secondary | ICD-10-CM

## 2020-07-30 MED ORDER — METFORMIN HCL 850 MG PO TABS
850.0000 mg | ORAL_TABLET | Freq: Every day | ORAL | 1 refills | Status: DC
Start: 2020-07-30 — End: 2020-08-04

## 2020-08-04 ENCOUNTER — Other Ambulatory Visit: Payer: Self-pay

## 2020-08-04 DIAGNOSIS — E119 Type 2 diabetes mellitus without complications: Secondary | ICD-10-CM

## 2020-08-04 MED ORDER — METFORMIN HCL 850 MG PO TABS: 850.0000 mg | ORAL_TABLET | Freq: Every day | ORAL | 1 refills | Status: DC

## 2020-08-06 ENCOUNTER — Other Ambulatory Visit: Payer: Self-pay | Admitting: Cardiovascular Disease

## 2020-08-06 MED ORDER — METOPROLOL TARTRATE 100 MG PO TABS: 100.0000 mg | ORAL_TABLET | Freq: Two times a day (BID) | ORAL | 0 refills | Status: DC

## 2020-08-06 NOTE — Telephone Encounter (Signed)
*  STAT* If patient is at the pharmacy, call can be transferred to refill team.   1. Which medications need to be refilled? (please list name of each medication and dose if known)   Metoprolol 100 mg po BID  2. Which pharmacy/location (including street and city if local pharmacy) is medication to be sent to?    [facility] [address]   3. Do they need a 30 day or 90 day supply? [place]

## 2020-08-06 NOTE — Telephone Encounter (Signed)
Requested Prescriptions   Signed Prescriptions Disp Refills   metoprolol tartrate (LOPRESSOR) 100 MG tablet 180 tablet 0    Sig: Take 1 tablet (100 mg total) by mouth 2 (two) times daily.    Authorizing Provider: Minna Merritts    Ordering User: Raelene Bott, Ardith Lewman L

## 2020-08-14 ENCOUNTER — Telehealth: Payer: Self-pay

## 2020-08-14 NOTE — Telephone Encounter (Signed)
Lmom for patient to give our office a call. Patient called requesting to reschedule her ultrasound for 08-15-20 and upon speaking to HB it is okay to push out until January.

## 2020-08-15 ENCOUNTER — Ambulatory Visit: Payer: PPO

## 2020-08-15 DIAGNOSIS — I700 Atherosclerosis of aorta: Secondary | ICD-10-CM

## 2020-08-15 DIAGNOSIS — I6523 Occlusion and stenosis of bilateral carotid arteries: Secondary | ICD-10-CM | POA: Diagnosis not present

## 2020-08-23 NOTE — Procedures (Signed)
[place], [place] [address]  DATE OF SERVICE: August 15, 2020  CAROTID DOPPLER INTERPRETATION:  Bilateral Carotid Ultrsasound and Color Doppler Examination was performed. The RIGHT CCA shows mild plaque in the vessel. The LEFT CCA shows mild plaque in the vessel. There was no intimal thickening noted in the RIGHT carotid artery. There was no intimal thickening in the LEFT carotid artery.  The RIGHT CCA shows peak systolic velocity of 61 cm per second. The end diastolic velocity is 10 cm per second on the RIGHT side. The RIGHT ICA shows peak systolic velocity of 73 per second. RIGHT sided ICA end diastolic velocity is 11 cm per second. The RIGHT ECA shows a peak systolic velocity of 79 cm per second. The ICA/CCA ratio is calculated to be 1.1. This suggests less than 50% stenosis. The Vertebral Artery shows antegrade flow.  The LEFT CCA shows peak systolic velocity of 66 cm per second. The end diastolic velocity is 12 cm per second on the LEFT side. The LEFT ICA shows peak systolic velocity of 68 per second. LEFT sided ICA end diastolic velocity is 13 cm per second. The LEFT ECA shows a peak systolic velocity of 62 cm per second. The ICA/CCA ratio is calculated to be 0.95. This suggests less than 50% stenosis. The Vertebral Artery shows antegrade flow.   Impression:    The RIGHT CAROTID shows less than 50% stenosis. The LEFT CAROTID shows less than 50% stenosis.  There is mild plaque formation noted on the LEFT and mild plaque on the RIGHT  side. Consider a repeat Carotid doppler if clinical situation and symptoms warrant in 6-12 months. Patient should be encouraged to change lifestyles such as smoking cessation, regular exercise and dietary modification. Use of statins in the right clinical setting and ASA is encouraged.  Allyne Gee, MD [facility] Pulmonary Critical Care Medicine

## 2020-08-24 NOTE — Progress Notes (Signed)
Mild plaque with <50% stenosis, bilaterally. Discuss with patient at next visit 12/28

## 2020-08-27 ENCOUNTER — Other Ambulatory Visit: Payer: Self-pay

## 2020-08-27 DIAGNOSIS — E119 Type 2 diabetes mellitus without complications: Secondary | ICD-10-CM

## 2020-08-27 MED ORDER — METFORMIN HCL 850 MG PO TABS: 850.0000 mg | ORAL_TABLET | Freq: Every day | ORAL | 1 refills | Status: DC

## 2020-08-28 ENCOUNTER — Other Ambulatory Visit: Payer: Self-pay

## 2020-08-28 DIAGNOSIS — E119 Type 2 diabetes mellitus without complications: Secondary | ICD-10-CM

## 2020-08-28 MED ORDER — METFORMIN HCL 850 MG PO TABS: 850.0000 mg | ORAL_TABLET | Freq: Every day | ORAL | 1 refills | Status: DC

## 2020-09-02 ENCOUNTER — Other Ambulatory Visit: Payer: Self-pay

## 2020-09-02 ENCOUNTER — Ambulatory Visit (INDEPENDENT_AMBULATORY_CARE_PROVIDER_SITE_OTHER): Payer: PPO | Admitting: Nurse Practitioner

## 2020-09-02 VITALS — BP 152/70 | HR 52 | Temp 98.4°F | Resp 16 | Ht 62.0 in | Wt 135.4 lb

## 2020-09-02 DIAGNOSIS — E119 Type 2 diabetes mellitus without complications: Secondary | ICD-10-CM

## 2020-09-02 DIAGNOSIS — I4891 Unspecified atrial fibrillation: Secondary | ICD-10-CM

## 2020-09-02 DIAGNOSIS — I700 Atherosclerosis of aorta: Secondary | ICD-10-CM

## 2020-09-02 NOTE — Progress Notes (Signed)
[facility] [address] [place], [place] [address]  Internal MEDICINE  Office Visit Note  Patient Name: Vanessa Gentry  [id number]  [id number]  Date of Service: 09/25/2020  Chief Complaint  Patient presents with  . Follow-up  . Diabetes  . Gastroesophageal Reflux  . Hypertension    The patient is here for follow up.  -history of aortic atherosclerosis and atrial fibrillation.  -had carotid doppler done since her last visit. She has mild plaque with <50% stenosis in bilateral carotid arteries.  -takes eliquis every day. -well managed type 2 diabetes.      Current Medication: Outpatient Encounter Medications as of 09/02/2020  Medication Sig  . acetaminophen (TYLENOL) 500 MG tablet Take 500 mg by mouth every 6 (six) hours as needed for moderate pain or headache.  Marland Kitchen amiodarone (PACERONE) 200 MG tablet Take 1 tablet (200 mg total) by mouth daily.  Marland Kitchen apixaban (ELIQUIS) 5 MG TABS tablet Take 1 tablet (5 mg total) by mouth 2 (two) times daily.  . Biotin 10 MG TABS Take 10 mg by mouth daily.  . cetirizine (ZYRTEC) 10 MG tablet Take 10 mg by mouth daily as needed for allergies.  . metFORMIN (GLUCOPHAGE) 850 MG tablet Take 1 tablet (850 mg total) by mouth daily.  . metoprolol tartrate (LOPRESSOR) 100 MG tablet Take 1 tablet (100 mg total) by mouth 2 (two) times daily.  . Naphazoline-Glycerin (CLEAR EYES MAX REDNESS RELIEF OP) Place 1 drop into both eyes daily as needed (itchy/irritated eyes).   No facility-administered encounter medications on file as of 09/02/2020.    Surgical History: Past Surgical History:  Procedure Laterality Date  . ABDOMINAL HYSTERECTOMY    . BREAST BIOPSY Left 2011   NEG  . CARDIOVERSION N/A 10/12/2019   Procedure: CARDIOVERSION;  Surgeon: Antonieta Iba, MD;  Location: [facility] ORS;  Service: Cardiovascular;  Laterality: N/A;  . CARDIOVERSION N/A 02/06/2020   Procedure: CARDIOVERSION;  Surgeon: Debbe Odea, MD;  Location: [facility] ORS;   Service: Cardiovascular;  Laterality: N/A;  . CATARACT EXTRACTION W/PHACO Right 03/13/2015   Procedure: CATARACT EXTRACTION PHACO AND INTRAOCULAR LENS PLACEMENT ([facility]);  Surgeon: Lockie Mola, MD;  Location: [facility] ORS;  Service: Ophthalmology;  Laterality: Right;  [place]  2:40  AP 20.3   CDE 32.41 cassette lot #   [id number]  . CHOLECYSTECTOMY    . ORIF ANKLE FRACTURE    . TEE WITHOUT CARDIOVERSION N/A 10/12/2019   Procedure: TRANSESOPHAGEAL ECHOCARDIOGRAM (TEE);  Surgeon: Antonieta Iba, MD;  Location: [facility] ORS;  Service: Cardiovascular;  Laterality: N/A;  . UPPER GI ENDOSCOPY      Medical History: Past Medical History:  Diagnosis Date  . Arthritis   . Diabetes mellitus without complication (HCC)   . Dysrhythmia   . GERD (gastroesophageal reflux disease)   . Hypertension   . Pneumonia     Family History: Family History  Problem Relation Age of Onset  . Heart failure Father   . Heart disease Sister   . Heart attack Sister   . Diabetes Brother   . Breast cancer Neg Hx     Social History   Socioeconomic History  . Marital status: Divorced    Spouse name: Not on file  . Number of children: Not on file  . Years of education: Not on file  . Highest education level: Not on file  Occupational History  . Not on file  Tobacco Use  . Smoking status: Never Smoker  . Smokeless tobacco: Never Used  Vaping Use  . Vaping Use: Never used  Substance and Sexual Activity  . Alcohol use: No  . Drug use: No  . Sexual activity: Not on file  Other Topics Concern  . Not on file  Social History Narrative  . Not on file   Social Determinants of Health   Financial Resource Strain: Not on file  Food Insecurity: Not on file  Transportation Needs: Not on file  Physical Activity: Not on file  Stress: Not on file  Social Connections: Not on file  Intimate Partner Violence: Not on file      Review of Systems  Constitutional: Negative for activity change, chills, fatigue and  unexpected weight change.  HENT: Negative for congestion, postnasal drip, rhinorrhea, sneezing and sore throat.   Respiratory: Negative for cough, chest tightness, shortness of breath and wheezing.   Cardiovascular: Negative for chest pain and palpitations.       Mildly elevated blood pressure today.   Gastrointestinal: Negative for abdominal pain, constipation, diarrhea, nausea and vomiting.  Endocrine: Negative for cold intolerance, heat intolerance, polydipsia and polyuria.       Blood sugars doing well   Musculoskeletal: Negative for arthralgias, back pain, joint swelling and neck pain.  Skin: Negative for rash.  Allergic/Immunologic: Negative for environmental allergies.  Neurological: Negative for dizziness, tremors, numbness and headaches.  Hematological: Negative for adenopathy. Does not bruise/bleed easily.  Psychiatric/Behavioral: Negative for behavioral problems (Depression), sleep disturbance and suicidal ideas. The patient is not nervous/anxious.    Today's Vitals   09/02/20 0936  BP: (!) 152/70  Pulse: (!) 52  Resp: 16  Temp: 98.4 F (36.9 C)  SpO2: 98%  Weight: 135 lb 6.4 oz (61.4 kg)  Height: 5\' 2"  (1.575 m)   Body mass index is 24.76 kg/m.  Physical Exam Vitals and nursing note reviewed.  Constitutional:      General: She is not in acute distress.    Appearance: Normal appearance. She is well-developed and well-nourished. She is not diaphoretic.  HENT:     Head: Normocephalic and atraumatic.     Nose: Nose normal.     Mouth/Throat:     Mouth: Oropharynx is clear and moist.     Pharynx: No oropharyngeal exudate.  Eyes:     Extraocular Movements: EOM normal.     Pupils: Pupils are equal, round, and reactive to light.  Neck:     Thyroid: No thyromegaly.     Vascular: No carotid bruit or JVD.     Trachea: No tracheal deviation.  Cardiovascular:     Rate and Rhythm: Normal rate and regular rhythm.     Heart sounds: Murmur heard.  No friction rub. No  gallop.   Pulmonary:     Effort: Pulmonary effort is normal. No respiratory distress.     Breath sounds: Normal breath sounds. No wheezing or rales.  Chest:     Chest wall: No tenderness.  Abdominal:     Palpations: Abdomen is soft.  Musculoskeletal:        General: Normal range of motion.     Cervical back: Normal range of motion and neck supple.  Lymphadenopathy:     Cervical: No cervical adenopathy.  Skin:    General: Skin is warm and dry.  Neurological:     General: No focal deficit present.     Mental Status: She is alert and oriented to person, place, and time.     Cranial Nerves: No cranial nerve deficit.  Psychiatric:  Mood and Affect: Mood and affect and mood normal.        Behavior: Behavior normal.        Thought Content: Thought content normal.        Judgment: Judgment normal.   Assessment/Plan: 1. Aortic atherosclerosis ([place]) Reviewed carotidd doppler with the patient. She has mild plaque with <50% stenosis bilaterally. She is on eliquis. Will monitor.   2. Atrial fibrillation with RVR (HCC) Stable. Patient seeing cardiology routinely.   3. Type 2 diabetes mellitus without complication, without long-term current use of insulin ([place]) conitnue diabetic medication as prescribed.   General Counseling: [place] verbalizes understanding of the findings of todays visit and agrees with plan of treatment. I have discussed any further diagnostic evaluation that may be needed or ordered today. We also reviewed her medications today. she has been encouraged to call the office with any questions or concerns that should arise related to todays visit.   This patient was seen by Leretha Pol FNP Collaboration with Dr Lavera Guise as a part of collaborative care agreement  Total time spent: 25 Minutes   Time spent includes review of chart, medications, test results, and follow up plan with the patient.      Dr Lavera Guise Internal medicine

## 2020-09-25 ENCOUNTER — Encounter: Payer: Self-pay | Admitting: Nurse Practitioner

## 2020-10-25 NOTE — Progress Notes (Unsigned)
Cardiology Office Note  Date:  10/27/2020   ID:  Travonna, Swindle 1933/02/28, MRN [id number]  PCP:  Ronnell Freshwater, NP   Chief Complaint  Patient presents with  . Follow-up    6 month F/U-Patient reports elevated BP readings    HPI:  87 y.o. female with history of  hypertension,  diabetes,  In the hospital February 2021 , new onset atrial fibrillation with RVR, mildly reduced EF of 45 to 50%  Secondary to challenging rate control efforts, underwent TEE cardioversion She presents today for follow-up of her atrial fibrillation, post hospital discharge  Last seen in the clinic 04/2020 Cardioversion 02/2020  Prior events reviewed  hospital February 2021 TEE performed October 12, 2019 followed by cardioversion, normal sinus rhythm   Estimated ejection fraction was 45%.  small  PFO descending thoracic aorta had moderate  mural aortic debris  Amiodarone started post cardioversion on Eliquis with metoprolol  01/15/2020 back in atrial fibrillation, had stopped amiodarone on her own  Started on Lasix  01/22/20 with improvement in orthopnea,  Underwent successful cardioversion 02/06/2020.  In follow-up today reports blood pressure running high both here and at home 629 to 476 systolic  Son busy, sometimes visits, not much Does her own shopping Tries to stay independent  EKG personally reviewed by myself on todays visit Sinus bradycardia rate 53 bpm nonspecific T wave abnormality V1 through V4   PMH:   has a past medical history of Arthritis, Diabetes mellitus without complication ([place]), Dysrhythmia, GERD (gastroesophageal reflux disease), Hypertension, and Pneumonia.  PSH:    Past Surgical History:  Procedure Laterality Date  . ABDOMINAL HYSTERECTOMY    . BREAST BIOPSY Left 2011   NEG  . CARDIOVERSION N/A 10/12/2019   Procedure: CARDIOVERSION;  Surgeon: Minna Merritts, MD;  Location: [facility] ORS;  Service: Cardiovascular;  Laterality: N/A;  . CARDIOVERSION N/A 02/06/2020    Procedure: CARDIOVERSION;  Surgeon: Kate Sable, MD;  Location: [facility] ORS;  Service: Cardiovascular;  Laterality: N/A;  . CATARACT EXTRACTION W/PHACO Right 03/13/2015   Procedure: CATARACT EXTRACTION PHACO AND INTRAOCULAR LENS PLACEMENT ([place]);  Surgeon: Leandrew Koyanagi, MD;  Location: [facility] ORS;  Service: Ophthalmology;  Laterality: Right;  [place]  2:40  AP 20.3   CDE 32.41 cassette lot #   [id number]  . CHOLECYSTECTOMY    . ORIF ANKLE FRACTURE    . TEE WITHOUT CARDIOVERSION N/A 10/12/2019   Procedure: TRANSESOPHAGEAL ECHOCARDIOGRAM (TEE);  Surgeon: Minna Merritts, MD;  Location: [facility] OR[facility];  Service: Cardiovascular;  Laterality: N/A;  . UPPER GI ENDOSCOPY      Current Outpatient Medications  Medication Sig Dispense Refill  . acetaminophen (TYLENOL) 500 MG tablet Take 500 mg by mouth every 6 (six) hours as needed for moderate pain or headache.    Marland Kitchen amiodarone (PACERONE) 200 MG tablet Take 1 tablet (200 mg total) by mouth daily. 90 tablet 3  . apixaban (ELIQUIS) 5 MG TABS tablet Take 1 tablet (5 mg total) by mouth 2 (two) times daily. 180 tablet 3  . cetirizine (ZYRTEC) 10 MG tablet Take 10 mg by mouth daily as needed for allergies.    . metFORMIN (GLUCOPHAGE) 850 MG tablet Take 1 tablet (850 mg total) by mouth daily. 90 tablet 1  . metoprolol tartrate (LOPRESSOR) 100 MG tablet Take 1 tablet (100 mg total) by mouth 2 (two) times daily. 180 tablet 0  . Naphazoline-Glycerin (CLEAR EYES MAX REDNESS RELIEF OP) Place 1 drop into both eyes daily as needed (itchy/irritated eyes).  No current facility-administered medications for this visit.    Allergies:   Metoprolol succinate [metoprolol], Sulfa antibiotics, and Iodinated diagnostic agents   Social History:  The patient  reports that she has never smoked. She has never used smokeless tobacco. She reports that she does not drink alcohol and does not use drugs.   Family History:   family history includes Diabetes in her brother; Heart  attack in her sister; Heart disease in her sister; Heart failure in her father.    Review of Systems: Review of Systems  Constitutional: Negative.   HENT: Negative.   Respiratory: Negative.   Cardiovascular: Negative.   Gastrointestinal: Negative.   Musculoskeletal: Negative.   Neurological: Negative.   Psychiatric/Behavioral: Negative.   All other systems reviewed and are negative.   PHYSICAL EXAM: VS:  BP (!) 182/80 (BP Location: Left Arm, Patient Position: Sitting, Cuff Size: Normal)   Pulse (!) 53   Ht 5\' 3"  (1.6 m)   Wt 140 lb (63.5 kg)   SpO2 97%   BMI 24.80 kg/m  , BMI Body mass index is 24.8 kg/m. Constitutional:  oriented to person, place, and time. No distress.  HENT:  Head: Grossly normal Eyes:  no discharge. No scleral icterus.  Neck: No JVD, no carotid bruits  Cardiovascular: Regular rate and rhythm, no murmurs appreciated Pulmonary/Chest: Clear to auscultation bilaterally, no wheezes or rails Abdominal: Soft.  no distension.  no tenderness.  Musculoskeletal: Normal range of motion Neurological:  normal muscle tone. Coordination normal. No atrophy Skin: Skin warm and dry Psychiatric: normal affect, pleasant  Recent Labs: 01/22/2020: ALT 22; Hemoglobin 13.6; Platelets 241 02/14/2020: BUN 12; Creatinine, Ser 0.94; Potassium 4.5; Sodium 140    Lipid Panel Lab Results  Component Value Date   CHOL 150 10/07/2019   HDL 41 10/07/2019   LDLCALC 96 10/07/2019   TRIG 64 10/07/2019      Wt Readings from Last 3 Encounters:  10/27/20 140 lb (63.5 kg)  09/02/20 135 lb 6.4 oz (61.4 kg)  07/22/20 123 lb 9.6 oz (56.1 kg)      ASSESSMENT AND PLAN:  Problem List Items Addressed This Visit      Cardiology Problems   HFrEF (heart failure with reduced ejection fraction) ([place])   Aortic atherosclerosis (HCC)   Cardiomyopathy ([place]) - Primary   Relevant Orders   EKG 12-Lead   Essential hypertension    Other Visit Diagnoses    Persistent atrial fibrillation  (HCC)       Relevant Orders   EKG 12-Lead   Pleural effusion          A. Fib/flutter RVR continue her amiodarone, metoprolol, anticoagulation with Eliquis In NSR  Essential hypertension Weight up BP running high Will start olmesartan 20 up to 40 daily as needed  Aortic atherosclerosis Currently not on a statin Will follow cholesterol Statin held for  polypharmacy and medication confusion    Total encounter time more than 25 minutes  Greater than 50% was spent in counseling and coordination of care with the patient    Signed, Esmond Plants, M.D., Ph.D. [place], [place]

## 2020-10-27 ENCOUNTER — Other Ambulatory Visit: Payer: Self-pay

## 2020-10-27 ENCOUNTER — Encounter: Payer: Self-pay | Admitting: Cardiovascular Disease

## 2020-10-27 ENCOUNTER — Ambulatory Visit: Payer: PPO | Admitting: Cardiovascular Disease

## 2020-10-27 VITALS — BP 182/80 | HR 53 | Ht 63.0 in | Wt 140.0 lb

## 2020-10-27 DIAGNOSIS — I5020 Unspecified systolic (congestive) heart failure: Secondary | ICD-10-CM | POA: Diagnosis not present

## 2020-10-27 DIAGNOSIS — I4819 Other persistent atrial fibrillation: Secondary | ICD-10-CM | POA: Diagnosis not present

## 2020-10-27 DIAGNOSIS — I420 Dilated cardiomyopathy: Secondary | ICD-10-CM | POA: Diagnosis not present

## 2020-10-27 DIAGNOSIS — I700 Atherosclerosis of aorta: Secondary | ICD-10-CM

## 2020-10-27 DIAGNOSIS — I10 Essential (primary) hypertension: Secondary | ICD-10-CM

## 2020-10-27 DIAGNOSIS — J90 Pleural effusion, not elsewhere classified: Secondary | ICD-10-CM | POA: Diagnosis not present

## 2020-10-27 MED ORDER — OLMESARTAN MEDOXOMIL 40 MG PO TABS: 40.0000 mg | ORAL_TABLET | Freq: Every day | ORAL | 6 refills | Status: DC

## 2020-10-27 NOTE — Patient Instructions (Addendum)
Medication Instructions:  Please start olmesartan 1/2 pill daily (20 mg) We will send in the 40 mg pill  Monitor blood pressure 2 weeks If still elevated, call the office We may need to increase up to a full pill (40mg )  If you need a refill on your cardiac medications before your next appointment, please call your pharmacy.    Lab work: No new labs needed   If you have labs (blood work) drawn today and your tests are completely normal, you will receive your results only by: Marland Kitchen MyChart Message (if you have MyChart) OR . A paper copy in the mail If you have any lab test that is abnormal or we need to change your treatment, we will call you to review the results.   Testing/Procedures: No new testing needed   Follow-Up: At [facility], you and your health needs are our priority.  As part of our continuing mission to provide you with exceptional heart care, we have created designated Provider Care Teams.  These Care Teams include your primary Cardiologist (physician) and Advanced Practice Providers (APPs -  Physician Assistants and Nurse Practitioners) who all work together to provide you with the care you need, when you need it.  . You will need a follow up appointment in 6 weeks, APP ok  . Providers on your designated Care Team:   . Murray Hodgkins, NP . Christell Faith, PA-C . Marrianne Mood, PA-C  Any Other Special Instructions Will Be Listed Below (If Applicable).  COVID-19 Vaccine Information can be found at: ShippingScam.co.uk For questions related to vaccine distribution or appointments, please email [email or web address] or call [phone].

## 2020-11-27 ENCOUNTER — Ambulatory Visit: Payer: PPO | Admitting: Hospice and Palliative Medicine

## 2020-12-05 ENCOUNTER — Telehealth: Payer: Self-pay | Admitting: Nurse Practitioner

## 2020-12-05 NOTE — Progress Notes (Signed)
  Chronic Care Management   Outreach Note  12/05/2020 Name: Vanessa Gentry MRN: [id number] DOB: [birth year]  Referred by: Ronnell Freshwater, NP Reason for referral : No chief complaint on file.   An unsuccessful telephone outreach was attempted today. The patient was referred to the pharmacist for assistance with care management and care coordination.   Follow Up Plan:   Carley Perdue [employer] Scheduler

## 2020-12-08 ENCOUNTER — Ambulatory Visit: Payer: PPO | Admitting: Cardiovascular Disease

## 2020-12-10 ENCOUNTER — Other Ambulatory Visit: Payer: Self-pay | Admitting: Cardiovascular Disease

## 2020-12-15 ENCOUNTER — Telehealth: Payer: Self-pay | Admitting: Cardiovascular Disease

## 2020-12-15 NOTE — Telephone Encounter (Signed)
Pt c/o medication issue:  1. Name of Medication: olmesartan 40 mg po q d   2. How are you currently taking this medication (dosage and times per day)? 40 mg po q d   3. Are you having a reaction (difficulty breathing--STAT)?  no  4. What is your medication issue? Patient calling to report bp log after recent med change   163/73 159/76 162/74 155/75 153/71  HR trends 50's and patient reports yesterday 47-49

## 2020-12-15 NOTE — Telephone Encounter (Signed)
Was able to return pt's phone call, she reports yesterday afternoon her HR was 47-49 while at rest. Pt's normal HR is in the 50s.  Recent BP taken in the afternoon. Pt reports takes her BP meds around lunch then take her BP at least one hour after.  163/73 159/76 162/74 155/75 153/71 Last office visit (10/27/20) BP 182/20 HR 53 Pt was started on olmesartan 1/2 pill daily (20 mg), BP has improved. But still elevated. Pt reports no CP, shob, dizziness, or weakness that was associated with the HR 47-49. Pt educated to stay hydrated, when noticed low HR to get up and move around to see if that increase HR back WNL, if develops symptoms associated w/low HR then call the clinic.   Advised will send secure message to Dr. Rockey Situ for review of episode of lower HR and still elevated HR, even though improvement from last visit. Pt grateful for the return phone call, will call back for anything further.

## 2020-12-16 NOTE — Telephone Encounter (Signed)
She can increase olmesartan to 40 daily Continue to monitor BP

## 2020-12-17 ENCOUNTER — Telehealth: Payer: Self-pay

## 2020-12-17 NOTE — Telephone Encounter (Signed)
Vanessa Gentry called back, this RN advised of Dr. Donivan Scull recommendation  "She can increase olmesartan to 40 daily Continue to monitor BP"  Vanessa Gentry verbalized understanding, will increase to whole pill daily of 40 mg and will continue to monitor BP, if BP remains HTN, she will call back with update and advice.

## 2020-12-17 NOTE — Telephone Encounter (Signed)
Attempted to reach out to pt regarding our conversation on 4/11 , no DPR on file to LDM, advised to call back.   Dr. Rockey Situ advised She can increase olmesartan to 40 daily Continue to monitor BP

## 2021-01-02 ENCOUNTER — Telehealth: Payer: Self-pay | Admitting: Nurse Practitioner

## 2021-01-02 NOTE — Progress Notes (Signed)
Cardiology Office Note  Date:  01/05/2021   ID:  Vanessa Gentry, Vanessa Gentry [birth year], MRN [id number]  PCP:  Ronnell Freshwater, NP   Chief Complaint  Patient presents with  . 6 week follow up     Patient c/o elevated blood pressure. Medications reviewed by the patient verbally.     HPI:  87 y.o. female with history of  hypertension,  diabetes,   hospital February 2021 , new onset atrial fibrillation with RVR, mildly reduced EF of 45 to 50%  Secondary to challenging rate control efforts, underwent TEE cardioversion Aortic atherosclerosis She presents today for follow-up of her atrial fibrillation, post hospital discharge  Last seen in the clinic February 2022 Cardioversion 02/2020  Denies atrial fibrillation  BP 130 to 140s  Pulse in the 50s  Son busy, sometimes visits, not much Does her own shopping Tries to stay independent  EKG personally reviewed by myself on todays visit Sinus bradycardia rate 55 bpm    hospital February 2021 TEE performed October 12, 2019 followed by cardioversion, normal sinus rhythm   Estimated ejection fraction was 45%.  small  PFO descending thoracic aorta had moderate  mural aortic debris  Amiodarone started post cardioversion on Eliquis with metoprolol  01/15/2020 back in atrial fibrillation, had stopped amiodarone on her own  Started on Lasix  01/22/20 with improvement in orthopnea,  Underwent successful cardioversion 02/06/2020.    PMH:   has a past medical history of Arthritis, Diabetes mellitus without complication ([place]), Dysrhythmia, GERD (gastroesophageal reflux disease), Hypertension, and Pneumonia.  PSH:    Past Surgical History:  Procedure Laterality Date  . ABDOMINAL HYSTERECTOMY    . BREAST BIOPSY Left 2011   NEG  . CARDIOVERSION N/A 10/12/2019   Procedure: CARDIOVERSION;  Surgeon: Minna Merritts, MD;  Location: [facility];  Service: Cardiovascular;  Laterality: N/A;  . CARDIOVERSION N/A 02/06/2020   Procedure: CARDIOVERSION;   Surgeon: Kate Sable, MD;  Location: [facility] OR[facility];  Service: Cardiovascular;  Laterality: N/A;  . CATARACT EXTRACTION W/PHACO Right 03/13/2015   Procedure: CATARACT EXTRACTION PHACO AND INTRAOCULAR LENS PLACEMENT ([place]);  Surgeon: Leandrew Koyanagi, MD;  Location: [facility] ORS;  Service: Ophthalmology;  Laterality: Right;  [place]  2:40  AP 20.3   CDE 32.41 cassette lot #   [id number]  . CHOLECYSTECTOMY    . ORIF ANKLE FRACTURE    . TEE WITHOUT CARDIOVERSION N/A 10/12/2019   Procedure: TRANSESOPHAGEAL ECHOCARDIOGRAM (TEE);  Surgeon: Minna Merritts, MD;  Location: [facility] ORS;  Service: Cardiovascular;  Laterality: N/A;  . UPPER GI ENDOSCOPY      Current Outpatient Medications  Medication Sig Dispense Refill  . acetaminophen (TYLENOL) 500 MG tablet Take 500 mg by mouth every 6 (six) hours as needed for moderate pain or headache.    Marland Kitchen amiodarone (PACERONE) 200 MG tablet Take 1 tablet (200 mg total) by mouth daily. 90 tablet 3  . apixaban (ELIQUIS) 5 MG TABS tablet Take 1 tablet (5 mg total) by mouth 2 (two) times daily. 180 tablet 3  . cetirizine (ZYRTEC) 10 MG tablet Take 10 mg by mouth daily as needed for allergies.    . metFORMIN (GLUCOPHAGE) 850 MG tablet Take 1 tablet (850 mg total) by mouth daily. 90 tablet 1  . metoprolol tartrate (LOPRESSOR) 100 MG tablet TAKE 1 TABLET BY MOUTH TWICE A DAY 180 tablet 0  . Naphazoline-Glycerin (CLEAR EYES MAX REDNESS RELIEF OP) Place 1 drop into both eyes daily as needed (itchy/irritated eyes).    Marland Kitchen olmesartan (  BENICAR) 40 MG tablet Take 1 tablet (40 mg total) by mouth daily. 30 tablet 6   No current facility-administered medications for this visit.    Allergies:   Metoprolol succinate [metoprolol], Sulfa antibiotics, and Iodinated diagnostic agents   Social History:  The patient  reports that she has never smoked. She has never used smokeless tobacco. She reports that she does not drink alcohol and does not use drugs.   Family History:   family history  includes Diabetes in her brother; Heart attack in her sister; Heart disease in her sister; Heart failure in her father.    Review of Systems: Review of Systems  Constitutional: Negative.   HENT: Negative.   Respiratory: Negative.   Cardiovascular: Negative.   Gastrointestinal: Negative.   Musculoskeletal: Negative.   Neurological: Negative.   Psychiatric/Behavioral: Negative.   All other systems reviewed and are negative.   PHYSICAL EXAM: VS:  BP (!) 164/60 (BP Location: Left Arm, Patient Position: Sitting, Cuff Size: Normal)   Pulse (!) 55   Ht 5\' 4"  (1.626 m)   Wt 144 lb 6 oz (65.5 kg)   SpO2 98%   BMI 24.78 kg/m  , BMI Body mass index is 24.78 kg/m. Constitutional:  oriented to person, place, and time. No distress.  HENT:  Head: Grossly normal Eyes:  no discharge. No scleral icterus.  Neck: No JVD, no carotid bruits  Cardiovascular: Regular rate and rhythm, no murmurs appreciated Pulmonary/Chest: Clear to auscultation bilaterally, no wheezes or rails Abdominal: Soft.  no distension.  no tenderness.  Musculoskeletal: Normal range of motion Neurological:  normal muscle tone. Coordination normal. No atrophy Skin: Skin warm and dry Psychiatric: normal affect, pleasant  Recent Labs: 01/22/2020: ALT 22; Hemoglobin 13.6; Platelets 241 02/14/2020: BUN 12; Creatinine, Ser 0.94; Potassium 4.5; Sodium 140    Lipid Panel Lab Results  Component Value Date   CHOL 150 10/07/2019   HDL 41 10/07/2019   LDLCALC 96 10/07/2019   TRIG 64 10/07/2019      Wt Readings from Last 3 Encounters:  01/05/21 144 lb 6 oz (65.5 kg)  10/27/20 140 lb (63.5 kg)  09/02/20 135 lb 6.4 oz (61.4 kg)      ASSESSMENT AND PLAN:  Problem List Items Addressed This Visit      Cardiology Problems   HFrEF (heart failure with reduced ejection fraction) ([place])   Aortic atherosclerosis ([place])   Cardiomyopathy ([place]) - Primary   Relevant Orders   EKG 12-Lead   Essential hypertension    Other Visit  Diagnoses    Persistent atrial fibrillation (HCC)       Relevant Orders   EKG 12-Lead   Pleural effusion          A. Fib/flutter RVR continue her amiodarone, metoprolol, anticoagulation with Eliquis In NSR Discussed monitoring pulse at home  Essential hypertension olmesartan 40 daily and metoprolol BP up a little , she is trying to lose weight, continue to watch BP She does not want more medications  Aortic atherosclerosis Statin held for  polypharmacy and medication confusion    Total encounter time more than 25 minutes  Greater than 50% was spent in counseling and coordination of care with the patient    Signed, Esmond Plants, M.D., Ph.D. [place], [place]

## 2021-01-02 NOTE — Progress Notes (Signed)
  Chronic Care Management   Outreach Note  01/02/2021 Name: Vanessa Gentry MRN: [id number] DOB: [birth year]  Referred by: Ronnell Freshwater, NP Reason for referral : No chief complaint on file.   A second unsuccessful telephone outreach was attempted today. The patient was referred to pharmacist for assistance with care management and care coordination.  Follow Up Plan:   Carley Perdue [employer] Scheduler

## 2021-01-05 ENCOUNTER — Other Ambulatory Visit: Payer: Self-pay

## 2021-01-05 ENCOUNTER — Ambulatory Visit: Payer: PPO | Admitting: Cardiovascular Disease

## 2021-01-05 ENCOUNTER — Encounter: Payer: Self-pay | Admitting: Cardiovascular Disease

## 2021-01-05 VITALS — BP 164/60 | HR 55 | Ht 64.0 in | Wt 144.4 lb

## 2021-01-05 DIAGNOSIS — I4819 Other persistent atrial fibrillation: Secondary | ICD-10-CM | POA: Diagnosis not present

## 2021-01-05 DIAGNOSIS — J90 Pleural effusion, not elsewhere classified: Secondary | ICD-10-CM

## 2021-01-05 DIAGNOSIS — I700 Atherosclerosis of aorta: Secondary | ICD-10-CM | POA: Diagnosis not present

## 2021-01-05 DIAGNOSIS — I420 Dilated cardiomyopathy: Secondary | ICD-10-CM | POA: Diagnosis not present

## 2021-01-05 DIAGNOSIS — I5020 Unspecified systolic (congestive) heart failure: Secondary | ICD-10-CM | POA: Diagnosis not present

## 2021-01-05 DIAGNOSIS — I10 Essential (primary) hypertension: Secondary | ICD-10-CM | POA: Diagnosis not present

## 2021-01-05 NOTE — Patient Instructions (Addendum)
Medication Instructions:  No changes  If you need a refill on your cardiac medications before your next appointment, please call your pharmacy.    Lab work: No new labs needed   If you have labs (blood work) drawn today and your tests are completely normal, you will receive your results only by: Marland Kitchen MyChart Message (if you have MyChart) OR . A paper copy in the mail If you have any lab test that is abnormal or we need to change your treatment, we will call you to review the results.   Testing/Procedures: No new testing needed   Follow-Up: At [facility], you and your health needs are our priority.  As part of our continuing mission to provide you with exceptional heart care, we have created designated Provider Care Teams.  These Care Teams include your primary Cardiologist (physician) and Advanced Practice Providers (APPs -  Physician Assistants and Nurse Practitioners) who all work together to provide you with the care you need, when you need it.  . You will need a follow up appointment in 6 months, ,APP ok  . Providers on your designated Care Team:   . Murray Hodgkins, NP . Christell Faith, PA-C . Marrianne Mood, PA-C  Any Other Special Instructions Will Be Listed Below (If Applicable).  COVID-19 Vaccine Information can be found at: [email or web address].uk For questions related to vaccine distribution or appointments, please email [email or web address] or call [phone].

## 2021-01-20 ENCOUNTER — Telehealth: Payer: Self-pay | Admitting: Nurse Practitioner

## 2021-01-20 NOTE — Progress Notes (Signed)
  Chronic Care Management   Outreach Note  01/20/2021 Name: Vanessa Gentry MRN: [id number] DOB: 1933/07/08  Referred by: Ronnell Freshwater, NP Reason for referral : No chief complaint on file.   Third unsuccessful telephone outreach was attempted today. The patient was referred to the pharmacist for assistance with care management and care coordination.   Follow Up Plan:   Carley Perdue [employer] Scheduler

## 2021-02-10 ENCOUNTER — Other Ambulatory Visit: Payer: Self-pay | Admitting: Family

## 2021-02-10 ENCOUNTER — Other Ambulatory Visit: Payer: Self-pay

## 2021-02-10 ENCOUNTER — Other Ambulatory Visit: Payer: Self-pay | Admitting: Internal Medicine

## 2021-02-10 DIAGNOSIS — E119 Type 2 diabetes mellitus without complications: Secondary | ICD-10-CM

## 2021-02-10 MED ORDER — METFORMIN HCL 850 MG PO TABS: 850.0000 mg | ORAL_TABLET | Freq: Every day | ORAL | 0 refills | Status: DC

## 2021-02-10 NOTE — Telephone Encounter (Signed)
Please review for refill, Thanks !  

## 2021-02-10 NOTE — Telephone Encounter (Signed)
Pt walk in that she no longer our pt her new pt don't have appt until next month send 1 month  Metformin

## 2021-02-10 NOTE — Telephone Encounter (Signed)
Please let [name] know I am sending a refill on her metformin, she needs to schedule an app in 1-2 months ( Alyssa )

## 2021-02-10 NOTE — Telephone Encounter (Signed)
9f, 65.5kg, scr 0.94 02/14/20, lovw/gollan 01/05/21

## 2021-02-22 ENCOUNTER — Other Ambulatory Visit: Payer: Self-pay | Admitting: Cardiovascular Disease

## 2021-03-09 ENCOUNTER — Other Ambulatory Visit: Payer: Self-pay | Admitting: Cardiovascular Disease

## 2021-03-23 ENCOUNTER — Ambulatory Visit: Payer: PPO | Admitting: Physician Assistant

## 2021-05-07 ENCOUNTER — Other Ambulatory Visit: Payer: Self-pay | Admitting: Internal Medicine

## 2021-05-07 DIAGNOSIS — E119 Type 2 diabetes mellitus without complications: Secondary | ICD-10-CM

## 2021-05-08 ENCOUNTER — Telehealth: Payer: Self-pay

## 2021-05-08 NOTE — Telephone Encounter (Signed)
Left vm to confirm 05/12/21 appointment-Vanessa Gentry

## 2021-05-12 ENCOUNTER — Other Ambulatory Visit: Payer: Self-pay

## 2021-05-12 ENCOUNTER — Encounter (INDEPENDENT_AMBULATORY_CARE_PROVIDER_SITE_OTHER): Payer: Self-pay

## 2021-05-12 ENCOUNTER — Encounter: Payer: Self-pay | Admitting: Nurse Practitioner

## 2021-05-12 ENCOUNTER — Ambulatory Visit (INDEPENDENT_AMBULATORY_CARE_PROVIDER_SITE_OTHER): Payer: PPO | Admitting: Nurse Practitioner

## 2021-05-12 VITALS — BP 180/78 | HR 70 | Temp 98.5°F | Resp 16 | Ht 63.0 in | Wt 150.8 lb

## 2021-05-12 DIAGNOSIS — Z0001 Encounter for general adult medical examination with abnormal findings: Secondary | ICD-10-CM | POA: Diagnosis not present

## 2021-05-12 DIAGNOSIS — E119 Type 2 diabetes mellitus without complications: Secondary | ICD-10-CM

## 2021-05-12 DIAGNOSIS — I700 Atherosclerosis of aorta: Secondary | ICD-10-CM | POA: Diagnosis not present

## 2021-05-12 DIAGNOSIS — I480 Paroxysmal atrial fibrillation: Secondary | ICD-10-CM

## 2021-05-12 DIAGNOSIS — R300 Dysuria: Secondary | ICD-10-CM | POA: Diagnosis not present

## 2021-05-12 DIAGNOSIS — I10 Essential (primary) hypertension: Secondary | ICD-10-CM | POA: Diagnosis not present

## 2021-05-12 LAB — POCT GLYCOSYLATED HEMOGLOBIN (HGB A1C): Hemoglobin A1C: 6 % — AB (ref 4.0–5.6)

## 2021-05-12 MED ORDER — METFORMIN HCL 500 MG PO TABS: 500.0000 mg | ORAL_TABLET | Freq: Two times a day (BID) | ORAL | 1 refills | Status: DC

## 2021-05-12 MED ORDER — AMLODIPINE BESYLATE 5 MG PO TABS: 5.0000 mg | ORAL_TABLET | Freq: Every day | ORAL | 0 refills | Status: DC

## 2021-05-12 NOTE — Progress Notes (Signed)
[facility] [place], [place] [address]  Internal MEDICINE  Office Visit Note  Patient Name: Vanessa Gentry  [id number]  [id number]  Date of Service: 05/12/2021  Chief Complaint  Patient presents with   Medicare Wellness   Diabetes   Gastroesophageal Reflux   Hypertension    HPI Samoria presents for an annual well visit and physical exam. she has a history of arthritis, diabetes, gastroesophageal reflux, hypertension, atrial fibrillation.  Surgical history is significant for hysterectomy, cholecystectomy, benign breast biopsy, and right cataract surgery.  She has also had multiple cardioversions for atrial fibrillation.  she has had 4 doses of the COVID-vaccine.  Family history is significant for diabetes heart disease and cancer.  Her diabetic eye exam is due in November this year.  She had her BMD screening done in 2016.  At her age, screening mammogram and routine colonoscopy are not clinically indicated at this time.  A1C was 6.0 today, up by 0.5 from 5.5 in November 2021. She denies any pain. She has no other concerns or questions.       Current Medication: Outpatient Encounter Medications as of 05/12/2021  Medication Sig   acetaminophen (TYLENOL) 500 MG tablet Take 500 mg by mouth every 6 (six) hours as needed for moderate pain or headache.   amiodarone (PACERONE) 200 MG tablet TAKE 1 TABLET BY MOUTH EVERY DAY   amLODipine (NORVASC) 5 MG tablet Take 1 tablet (5 mg total) by mouth daily.   cetirizine (ZYRTEC) 10 MG tablet Take 10 mg by mouth daily as needed for allergies.   ELIQUIS 5 MG TABS tablet TAKE 1 TABLET BY MOUTH TWICE A DAY   metFORMIN (GLUCOPHAGE) 500 MG tablet Take 1 tablet (500 mg total) by mouth 2 (two) times daily with a meal.   metoprolol tartrate (LOPRESSOR) 100 MG tablet TAKE 1 TABLET BY MOUTH TWICE A DAY   olmesartan (BENICAR) 40 MG tablet Take 1 tablet (40 mg total) by mouth daily.   [DISCONTINUED] metFORMIN (GLUCOPHAGE) 850 MG  tablet TAKE 1 TABLET (850 MG TOTAL) BY MOUTH DAILY.   [DISCONTINUED] Naphazoline-Glycerin (CLEAR EYES MAX REDNESS RELIEF OP) Place 1 drop into both eyes daily as needed (itchy/irritated eyes). (Patient not taking: Reported on 05/12/2021)   No facility-administered encounter medications on file as of 05/12/2021.    Surgical History: Past Surgical History:  Procedure Laterality Date   ABDOMINAL HYSTERECTOMY     BREAST BIOPSY Left 2011   NEG   CARDIOVERSION N/A 10/12/2019   Procedure: CARDIOVERSION;  Surgeon: Minna Merritts, MD;  Location: [facility]S;  Service: Cardiovascular;  Laterality: N/A;   CARDIOVERSION N/A 02/06/2020   Procedure: CARDIOVERSION;  Surgeon: Kate Sable, MD;  Location: [facility];  Service: Cardiovascular;  Laterality: N/A;   CATARACT EXTRACTION W/PHACO Right 03/13/2015   Procedure: CATARACT EXTRACTION PHACO AND INTRAOCULAR LENS PLACEMENT ([place]);  Surgeon: Leandrew Koyanagi, MD;  Location: [facility];  Service: Ophthalmology;  Laterality: Right;  [place]  2:40  AP 20.3   CDE 32.41 cassette lot #   [id number]   CHOLECYSTECTOMY     ORIF ANKLE FRACTURE     TEE WITHOUT CARDIOVERSION N/A 10/12/2019   Procedure: TRANSESOPHAGEAL ECHOCARDIOGRAM (TEE);  Surgeon: Minna Merritts, MD;  Location: [facility];  Service: Cardiovascular;  Laterality: N/A;   UPPER GI ENDOSCOPY      Medical History: Past Medical History:  Diagnosis Date   A-fib ([place])    Arthritis    Diabetes mellitus without complication ([place])  Dysrhythmia    GERD (gastroesophageal reflux disease)    Hypertension    Pneumonia     Family History: Family History  Problem Relation Age of Onset   Heart failure Father    Heart disease Sister    Heart attack Sister    Diabetes Brother    Cancer Brother    Diabetes Daughter    Atrial fibrillation Daughter    Breast cancer Neg Hx     Social History   Socioeconomic History   Marital status: Divorced    Spouse name: Not on file   Number of children: Not on file    Years of education: Not on file   Highest education level: Not on file  Occupational History   Not on file  Tobacco Use   Smoking status: Never   Smokeless tobacco: Never  Vaping Use   Vaping Use: Never used  Substance and Sexual Activity   Alcohol use: No   Drug use: No   Sexual activity: Not on file  Other Topics Concern   Not on file  Social History Narrative   Not on file   Social Determinants of Health   Financial Resource Strain: Not on file  Food Insecurity: Not on file  Transportation Needs: Not on file  Physical Activity: Not on file  Stress: Not on file  Social Connections: Not on file  Intimate Partner Violence: Not on file      Review of Systems  Constitutional:  Negative for activity change, appetite change, chills, fatigue, fever and unexpected weight change.  HENT: Negative.  Negative for congestion, ear pain, rhinorrhea, sore throat and trouble swallowing.   Eyes: Negative.   Respiratory: Negative.  Negative for cough, chest tightness, shortness of breath and wheezing.   Cardiovascular: Negative.  Negative for chest pain.  Gastrointestinal: Negative.  Negative for abdominal pain, blood in stool, constipation, diarrhea, nausea and vomiting.  Endocrine: Negative.   Genitourinary: Negative.  Negative for difficulty urinating, dysuria, frequency, hematuria and urgency.  Musculoskeletal: Negative.  Negative for arthralgias, back pain, joint swelling, myalgias and neck pain.  Skin: Negative.  Negative for rash and wound.  Allergic/Immunologic: Negative.  Negative for immunocompromised state.  Neurological: Negative.  Negative for dizziness, seizures, numbness and headaches.  Hematological: Negative.   Psychiatric/Behavioral: Negative.  Negative for behavioral problems, self-injury and suicidal ideas. The patient is not nervous/anxious.    Vital Signs: BP (!) 180/78 Comment: 192/72  Pulse 70   Temp 98.5 F (36.9 C)   Resp 16   Ht '5\' 3"'  (1.6 m)   Wt 150  lb 12.8 oz (68.4 kg)   SpO2 98%   BMI 26.71 kg/m    Physical Exam Vitals reviewed.  Constitutional:      General: She is awake. She is not in acute distress.    Appearance: Normal appearance. She is well-developed, well-groomed and normal weight. She is not ill-appearing or diaphoretic.  HENT:     Head: Normocephalic and atraumatic.     Right Ear: Tympanic membrane, ear canal and external ear normal.     Left Ear: Tympanic membrane, ear canal and external ear normal.     Nose: Nose normal. No congestion or rhinorrhea.     Mouth/Throat:     Mouth: Mucous membranes are moist.     Pharynx: Oropharynx is clear. No oropharyngeal exudate or posterior oropharyngeal erythema.  Eyes:     General: Lids are normal. Vision grossly intact. Gaze aligned appropriately. No scleral icterus.  Right eye: No discharge.        Left eye: No discharge.     Extraocular Movements: Extraocular movements intact.     Conjunctiva/sclera: Conjunctivae normal.     Pupils: Pupils are equal, round, and reactive to light.     Funduscopic exam:    Right eye: Red reflex present.        Left eye: Red reflex present. Neck:     Thyroid: No thyromegaly.     Vascular: No carotid bruit or JVD.     Trachea: Trachea and phonation normal. No tracheal deviation.  Cardiovascular:     Rate and Rhythm: Normal rate and regular rhythm.     Pulses: Normal pulses.          Dorsalis pedis pulses are 2+ on the right side and 2+ on the left side.       Posterior tibial pulses are 2+ on the right side and 2+ on the left side.     Heart sounds: Normal heart sounds. No murmur heard.   No friction rub. No gallop.  Pulmonary:     Effort: Pulmonary effort is normal. No respiratory distress.     Breath sounds: Normal breath sounds. No stridor. No wheezing or rales.  Chest:     Chest wall: No tenderness.  Abdominal:     General: Bowel sounds are normal. There is no distension.     Palpations: Abdomen is soft. There is no mass.      Tenderness: There is no abdominal tenderness. There is no guarding or rebound.  Musculoskeletal:        General: No tenderness or deformity. Normal range of motion.     Cervical back: Normal range of motion and neck supple.     Right lower leg: No edema.     Left lower leg: No edema.     Right foot: Normal range of motion. No deformity, bunion, Charcot foot, foot drop or prominent metatarsal heads.     Left foot: Normal range of motion. No deformity, bunion, Charcot foot, foot drop or prominent metatarsal heads.  Feet:     Right foot:     Protective Sensation: 6 sites tested.  6 sites sensed.     Skin integrity: Skin integrity normal. No ulcer, blister, skin breakdown, erythema, warmth, callus, dry skin or fissure.     Toenail Condition: Right toenails are normal.     Left foot:     Protective Sensation: 6 sites tested.  6 sites sensed.     Skin integrity: Skin integrity normal. No ulcer, blister, skin breakdown, erythema, warmth, callus, dry skin or fissure.     Toenail Condition: Left toenails are normal.  Lymphadenopathy:     Cervical: No cervical adenopathy.  Skin:    General: Skin is warm and dry.     Capillary Refill: Capillary refill takes less than 2 seconds.     Coloration: Skin is not pale.     Findings: No erythema or rash.  Neurological:     Mental Status: She is alert and oriented to person, place, and time.     Cranial Nerves: No cranial nerve deficit.     Motor: No abnormal muscle tone.     Coordination: Coordination normal.     Gait: Gait normal.     Deep Tendon Reflexes: Reflexes are normal and symmetric.  Psychiatric:        Mood and Affect: Mood normal.        Behavior: Behavior normal.  Behavior is cooperative.        Thought Content: Thought content normal.        Judgment: Judgment normal.     Assessment/Plan: 1. Encounter for general adult medical examination with abnormal findings Age-appropriate preventive screenings and vaccinations discussed,  annual physical exam completed. Routine labs for health maintenance ordered, see below. PHM updated. Mammogram and colonoscopy not clinically indicated.   2. Type 2 diabetes mellitus without complication, without long-term current use of insulin (HCC) A1c increased by 0.5. metformin dose adjusted. Routine labs ordered. Recheck A1C in 3 months.  - POCT HgB A1C - metFORMIN (GLUCOPHAGE) 500 MG tablet; Take 1 tablet (500 mg total) by mouth 2 (two) times daily with a meal.  Dispense: 180 tablet; Refill: 1 - CMP14+EGFR  3. Essential hypertension Start amlodipine 5 mg daily, follow up in 4 weeks.  - amLODipine (NORVASC) 5 MG tablet; Take 1 tablet (5 mg total) by mouth daily.  Dispense: 90 tablet; Refill: 0  4. Paroxysmal atrial fibrillation (HCC) Followed by cardiology, taking amiodarone, metoprolol, and eliquis. Routine lab ordered. - CBC with Differential/Platelet  5. Aortic atherosclerosis (HCC) Not currently on statin therapy, labs ordered, check lipid panel, will discuss statin therapy at next office visit.  - CBC with Differential/Platelet - Lipid Profile  6. Dysuria Routine urinalysis done.  - UA/M w/rflx Culture, Routine - Microscopic Examination - Urine Culture, Reflex    General Counseling: Nya verbalizes understanding of the findings of todays visit and agrees with plan of treatment. I have discussed any further diagnostic evaluation that may be needed or ordered today. We also reviewed her medications today. she has been encouraged to call the office with any questions or concerns that should arise related to todays visit.    Orders Placed This Encounter  Procedures   UA/M w/rflx Culture, Routine   CBC with Differential/Platelet   CMP14+EGFR   Lipid Profile   POCT HgB A1C    Meds ordered this encounter  Medications   metFORMIN (GLUCOPHAGE) 500 MG tablet    Sig: Take 1 tablet (500 mg total) by mouth 2 (two) times daily with a meal.    Dispense:  180 tablet     Refill:  1   amLODipine (NORVASC) 5 MG tablet    Sig: Take 1 tablet (5 mg total) by mouth daily.    Dispense:  90 tablet    Refill:  0    Return in about 4 weeks (around 06/09/2021) for F/U, BP check, Aariya Ferrick PCP.   Total time spent:30 Minutes Time spent includes review of chart, medications, test results, and follow up plan with the patient.   [place] Controlled Substance Database was reviewed by me.  This patient was seen by Jonetta Osgood, FNP-C in collaboration with Dr. Clayborn Bigness as a part of collaborative care agreement.  Arianne Klinge R. Valetta Fuller, MSN, FNP-C Internal medicine

## 2021-05-16 LAB — UA/M W/RFLX CULTURE, ROUTINE
Bilirubin, UA: NEGATIVE
Glucose, UA: NEGATIVE
Ketones, UA: NEGATIVE
Nitrite, UA: NEGATIVE
Protein,UA: NEGATIVE
Specific Gravity, UA: 1.008 (ref 1.005–1.030)
Urobilinogen, Ur: 0.2 mg/dL (ref 0.2–1.0)
pH, UA: 6 (ref 5.0–7.5)

## 2021-05-16 LAB — URINE CULTURE, REFLEX

## 2021-05-16 LAB — MICROSCOPIC EXAMINATION
Bacteria, UA: NONE SEEN
Casts: NONE SEEN /lpf
Epithelial Cells (non renal): NONE SEEN /hpf (ref 0–10)

## 2021-05-18 IMAGING — CR DG CHEST 2V
2 series · 2 of 2 positions shown · non-contrast
Comparison: 04/22/2009

CLINICAL DATA: Cough and shortness of breath for several days.

EXAM:
CHEST - 2 VIEW

[chest pa]
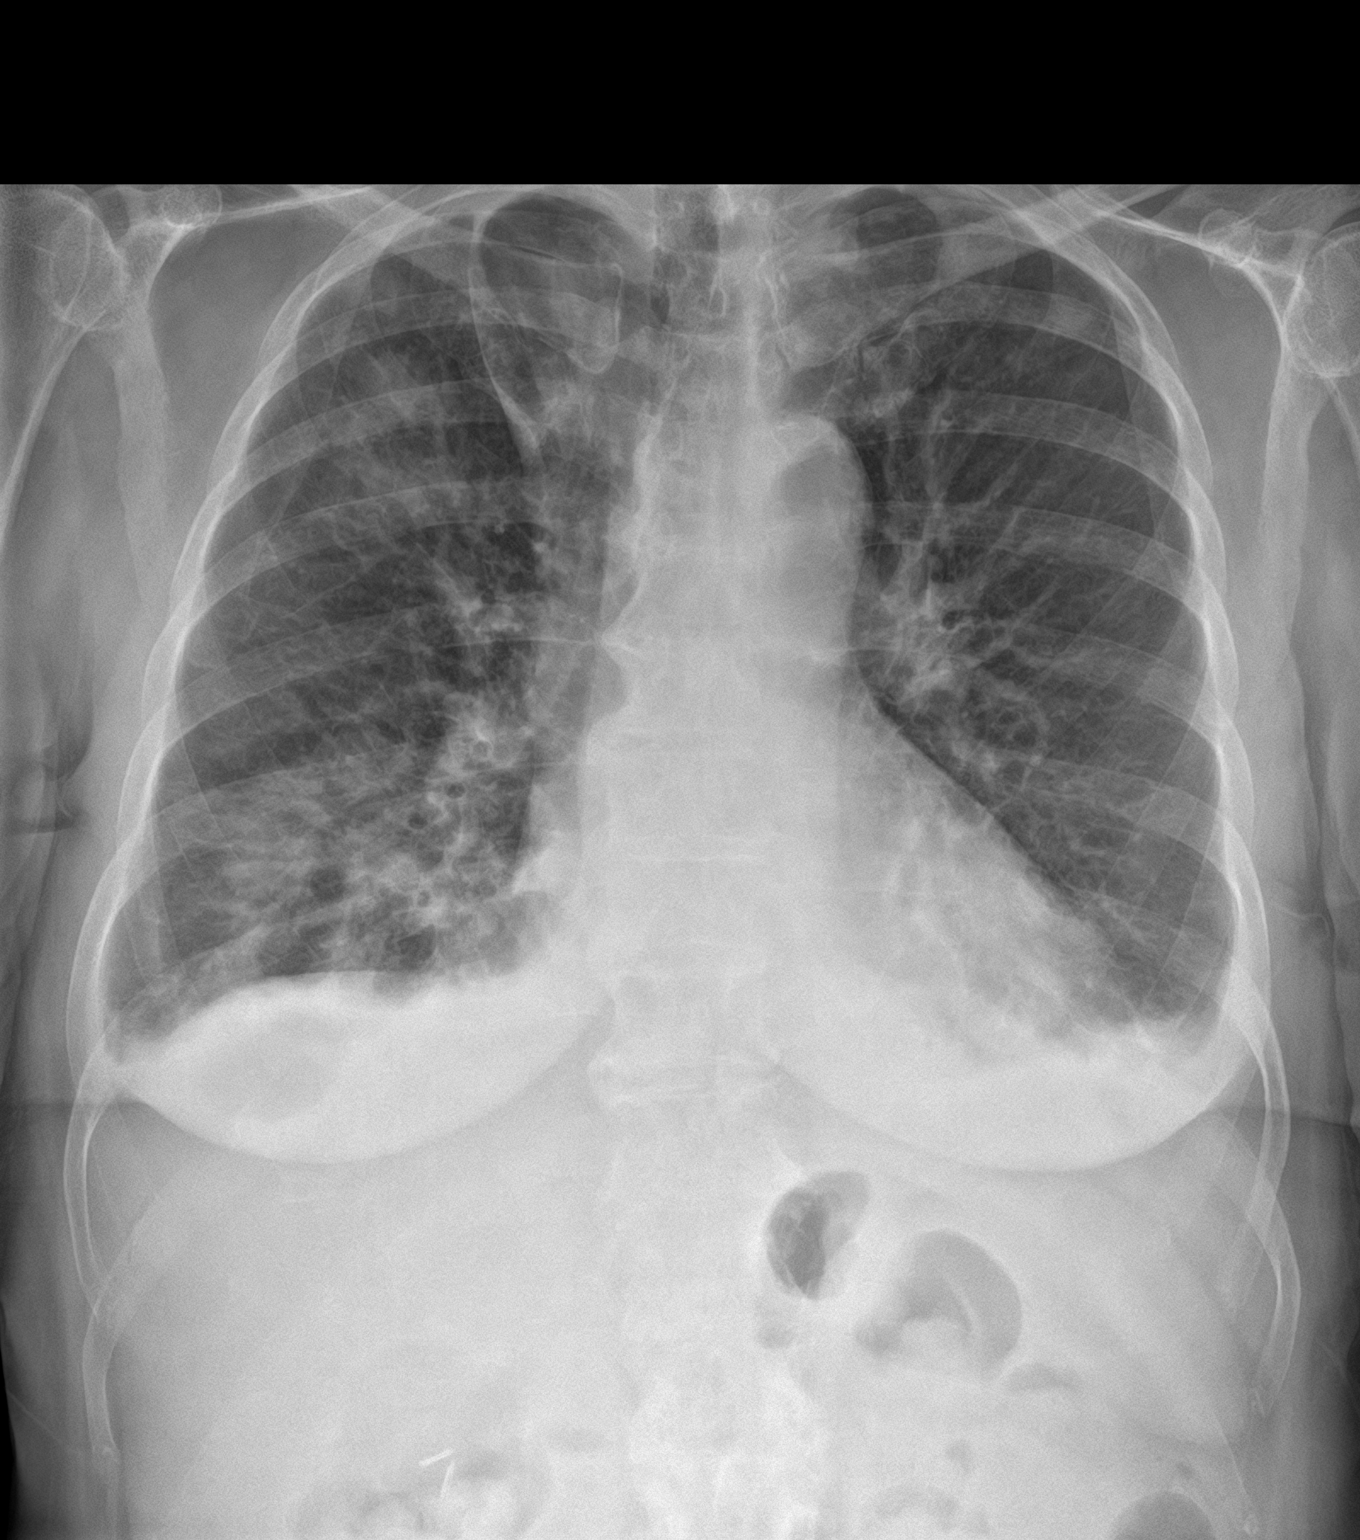

[chest lat]
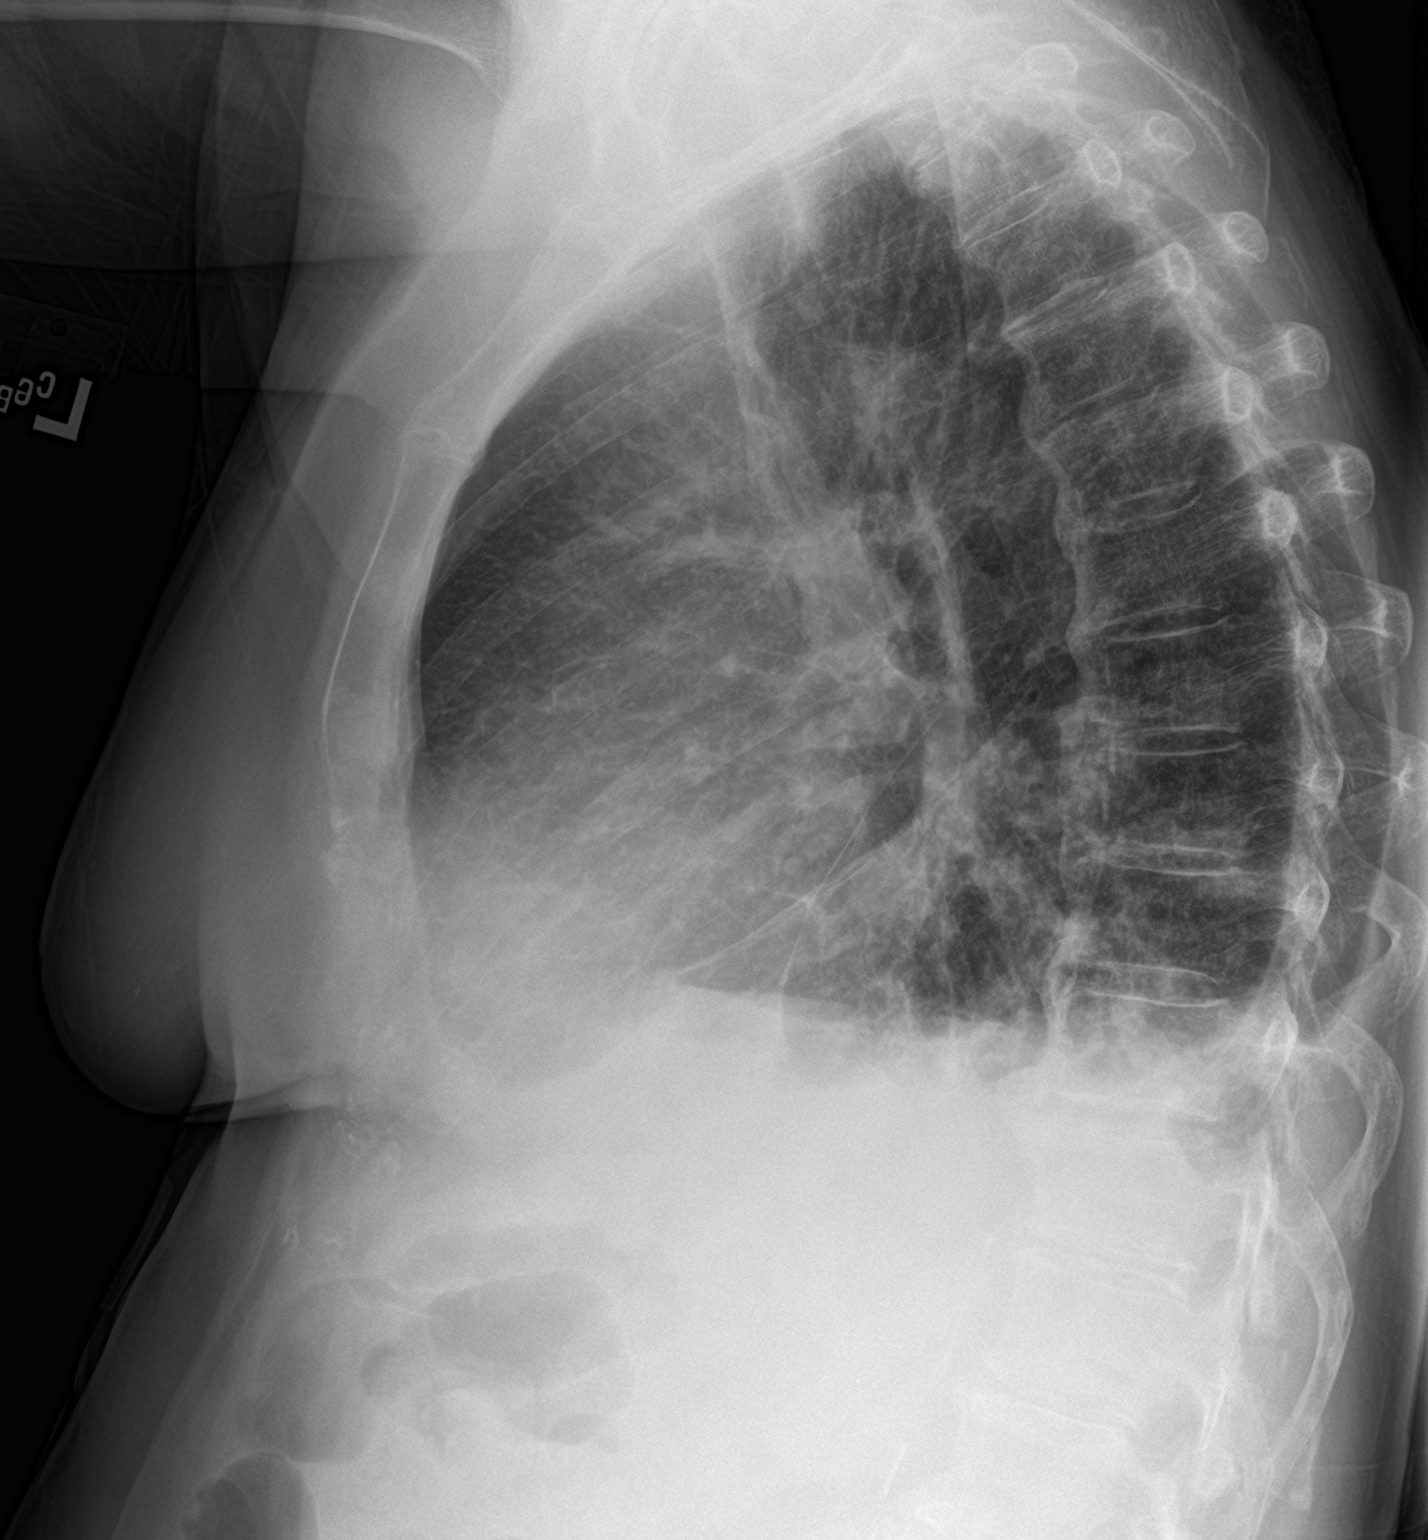

[2 of 2 positions shown; findings below may reference images not displayed]

FINDINGS: Heart size is normal. Azygos fissure again noted. New multifocal
ill-defined areas of airspace opacity are seen in both lungs, right
side greater than left. Small bilateral pleural effusions are also
noted.
IMPRESSION: New multifocal bilateral airspace opacity, right side greater than
left, and small bilateral pleural effusions.

## 2021-05-23 ENCOUNTER — Other Ambulatory Visit: Payer: Self-pay | Admitting: Cardiovascular Disease

## 2021-06-05 ENCOUNTER — Other Ambulatory Visit: Payer: Self-pay | Admitting: Cardiovascular Disease

## 2021-06-08 ENCOUNTER — Ambulatory Visit: Payer: PPO | Admitting: Nurse Practitioner

## 2021-07-05 NOTE — Progress Notes (Signed)
Cardiology Office Note  Date:  07/06/2021   ID:  Vanessa Gentry, Vanessa Gentry March 28, 1933, MRN [id number]  PCP:  Lavera Guise, MD   Chief Complaint  Patient presents with   6 month follow up     "Doing well." Medications reviewed by the patient verbally.     HPI:  87 y.o. female with history of  hypertension,  diabetes,  hospital February 2021 , new onset atrial fibrillation with RVR, mildly reduced EF of 45 to 50%  Secondary to challenging rate control efforts, underwent TEE cardioversion Aortic atherosclerosis She presents today for follow-up of her atrial fibrillation, post hospital discharge  LOV 5/22  Prior arrhythmia history reviewed In the hospital in February 2021 for atrial fibrillation with RVR TEE cardioversion required  Denies any recurrence of her arrhythmia on today's visit No near syncope or syncope, no shortness of breath episodes  Lives alone, sedentary No falls Son helps Does her own shopping and ADLs Tries to stay independent  EKG personally reviewed by myself on todays visit Sinus bradycardia rate 50 bpm no significant ST or T wave changes  Other past medical hx  hospital February 2021 TEE performed October 12, 2019 followed by cardioversion, normal sinus rhythm   Estimated ejection fraction was 45%.  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   PMH:   has a past medical history of A-fib ([place]), Arthritis, Diabetes mellitus without complication ([place]), Dysrhythmia, GERD (gastroesophageal reflux disease), Hypertension, and Pneumonia.  PSH:    Past Surgical History:  Procedure Laterality Date   ABDOMINAL HYSTERECTOMY     BREAST BIOPSY Left 2011   NEG   CARDIOVERSION N/A 10/12/2019    Procedure: CARDIOVERSION;  Surgeon: Minna Merritts, MD;  Location: [facility];  Service: Cardiovascular;  Laterality: N/A;   CARDIOVERSION N/A 02/06/2020   Procedure: CARDIOVERSION;  Surgeon: Kate Sable, MD;  Location: [facility];  Service: Cardiovascular;  Laterality: N/A;   CATARACT EXTRACTION W/PHACO Right 03/13/2015   Procedure: CATARACT EXTRACTION PHACO AND INTRAOCULAR LENS PLACEMENT ([place]);  Surgeon: Leandrew Koyanagi, MD;  Location: [facility];  Service: Ophthalmology;  Laterality: Right;  [place]  2:40  AP 20.3   CDE 32.41 cassette lot #   [id number]   CHOLECYSTECTOMY     ORIF ANKLE FRACTURE     TEE WITHOUT CARDIOVERSION N/A 10/12/2019   Procedure: TRANSESOPHAGEAL ECHOCARDIOGRAM (TEE);  Surgeon: Minna Merritts, MD;  Location: [facility];  Service: Cardiovascular;  Laterality: N/A;   UPPER GI ENDOSCOPY      Current Outpatient Medications  Medication Sig Dispense Refill   acetaminophen (TYLENOL) 500 MG tablet Take 500 mg by mouth every 6 (six) hours as needed for moderate pain or headache.     amiodarone (PACERONE) 200 MG tablet TAKE 1 TABLET BY MOUTH EVERY DAY 90 tablet 1   amLODipine (NORVASC) 5 MG tablet Take 1 tablet (5 mg total) by mouth daily. 90 tablet 0   cetirizine (ZYRTEC) 10 MG tablet Take 10 mg by mouth daily as needed for allergies.     ELIQUIS 5 MG TABS tablet TAKE 1 TABLET BY MOUTH TWICE A DAY 180 tablet 1   metFORMIN (GLUCOPHAGE) 500 MG tablet Take 1 tablet (500 mg total) by mouth 2 (two) times daily with a meal.  180 tablet 1   metoprolol tartrate (LOPRESSOR) 100 MG tablet TAKE 1 TABLET BY MOUTH TWICE A DAY 180 tablet 0   olmesartan (BENICAR) 40 MG tablet TAKE 1 TABLET BY MOUTH EVERY DAY 90 tablet 0   No current facility-administered medications for this visit.    Allergies:   Metoprolol succinate [metoprolol], Sulfa antibiotics, and Iodinated diagnostic agents   Social History:  The patient  reports that she has never smoked. She has never used smokeless tobacco. She  reports that she does not drink alcohol and does not use drugs.   Family History:   family history includes Atrial fibrillation in her daughter; Cancer in her brother; Diabetes in her brother and daughter; Heart attack in her sister; Heart disease in her sister; Heart failure in her father.    Review of Systems: Review of Systems  Constitutional: Negative.   HENT: Negative.    Respiratory: Negative.    Cardiovascular: Negative.   Gastrointestinal: Negative.   Musculoskeletal: Negative.   Neurological: Negative.   Psychiatric/Behavioral: Negative.    All other systems reviewed and are negative.  PHYSICAL EXAM: VS:  BP (!) 138/52 (BP Location: Left Arm, Patient Position: Sitting, Cuff Size: Normal)   Pulse (!) 50   Ht 5\' 3"  (1.6 m)   Wt 150 lb (68 kg)   SpO2 98%   BMI 26.57 kg/m  , BMI Body mass index is 26.57 kg/m. Constitutional:  oriented to person, place, and time. No distress.  HENT:  Head: Grossly normal Eyes:  no discharge. No scleral icterus.  Neck: No JVD, no carotid bruits  Cardiovascular: Regular rate and rhythm, no murmurs appreciated Pulmonary/Chest: Clear to auscultation bilaterally, no wheezes or rails Abdominal: Soft.  no distension.  no tenderness.  Musculoskeletal: Normal range of motion Neurological:  normal muscle tone. Coordination normal. No atrophy Skin: Skin warm and dry Psychiatric: normal affect, pleasant  Recent Labs: No results found for requested labs within last 8760 hours.    Lipid Panel Lab Results  Component Value Date   CHOL 150 10/07/2019   HDL 41 10/07/2019   LDLCALC 96 10/07/2019   TRIG 64 10/07/2019      Wt Readings from Last 3 Encounters:  07/06/21 150 lb (68 kg)  05/12/21 150 lb 12.8 oz (68.4 kg)  01/05/21 144 lb 6 oz (65.5 kg)      ASSESSMENT AND PLAN:  Problem List Items Addressed This Visit       Cardiology Problems   HFrEF (heart failure with reduced ejection fraction) (HCC)   Aortic atherosclerosis (HCC)    Cardiomyopathy ([place]) - Primary   Essential hypertension   Other Visit Diagnoses     Persistent atrial fibrillation (HCC)       Pleural effusion           A. Fib/flutter RVR Continue current medications maintaining amiodarone, metoprolol, anticoagulation with Eliquis In NSR Bradycardia, some tremor, Will decrease the metoprolol tartrate to 50 BID   Essential hypertension olmesartan 40 daily and metoprolol (will decrease the dose as above) BP stable  Aortic atherosclerosis Statin held for  polypharmacy and medication confusion    Total encounter time more than 25 minutes  Greater than 50% was spent in counseling and coordination of care with the patient    Signed, Esmond Plants, M.D., Ph.D. [place], [place]

## 2021-07-06 ENCOUNTER — Other Ambulatory Visit: Payer: Self-pay

## 2021-07-06 ENCOUNTER — Ambulatory Visit: Payer: PPO | Admitting: Cardiovascular Disease

## 2021-07-06 ENCOUNTER — Encounter: Payer: Self-pay | Admitting: Cardiovascular Disease

## 2021-07-06 VITALS — BP 138/52 | HR 50 | Ht 63.0 in | Wt 150.0 lb

## 2021-07-06 DIAGNOSIS — I420 Dilated cardiomyopathy: Secondary | ICD-10-CM | POA: Diagnosis not present

## 2021-07-06 DIAGNOSIS — J90 Pleural effusion, not elsewhere classified: Secondary | ICD-10-CM

## 2021-07-06 DIAGNOSIS — I10 Essential (primary) hypertension: Secondary | ICD-10-CM | POA: Diagnosis not present

## 2021-07-06 DIAGNOSIS — I700 Atherosclerosis of aorta: Secondary | ICD-10-CM

## 2021-07-06 DIAGNOSIS — I5020 Unspecified systolic (congestive) heart failure: Secondary | ICD-10-CM

## 2021-07-06 DIAGNOSIS — I4819 Other persistent atrial fibrillation: Secondary | ICD-10-CM

## 2021-07-06 MED ORDER — AMLODIPINE BESYLATE 5 MG PO TABS: 5.0000 mg | ORAL_TABLET | Freq: Every day | ORAL | 3 refills | Status: DC

## 2021-07-06 MED ORDER — OLMESARTAN MEDOXOMIL 40 MG PO TABS: 40.0000 mg | ORAL_TABLET | Freq: Every day | ORAL | 3 refills | Status: DC

## 2021-07-06 MED ORDER — AMIODARONE HCL 200 MG PO TABS: 200.0000 mg | ORAL_TABLET | Freq: Every day | ORAL | 3 refills | Status: DC

## 2021-07-06 MED ORDER — METOPROLOL TARTRATE 50 MG PO TABS
50.0000 mg | ORAL_TABLET | Freq: Two times a day (BID) | ORAL | 3 refills | Status: DC
Start: 2021-07-06 — End: 2022-09-13

## 2021-07-06 NOTE — Patient Instructions (Addendum)
Medication Instructions:  Please call Eliquis Handout given Please decrease  metoprolol tartrate down to 50 mg twice a day  If you need a refill on your cardiac medications before your next appointment, please call your pharmacy.    Lab work: No new labs needed  Testing/Procedures: No new testing needed  Follow-Up: At [facility], you and your health needs are our priority.  As part of our continuing mission to provide you with exceptional heart care, we have created designated Provider Care Teams.  These Care Teams include your primary Cardiologist (physician) and Advanced Practice Providers (APPs -  Physician Assistants and Nurse Practitioners) who all work together to provide you with the care you need, when you need it.  You will need a follow up appointment in 12 months  Providers on your designated Care Team:   Murray Hodgkins, NP Christell Faith, PA-C Cadence Kathlen Mody, [place]  COVID-19 Vaccine Information can be found at: [email or web address].uk For questions related to vaccine distribution or appointments, please email vaccine@[email or web address] .[email or web address] or call [phone].

## 2021-07-16 ENCOUNTER — Ambulatory Visit: Payer: PPO | Admitting: Nurse Practitioner

## 2021-07-31 ENCOUNTER — Other Ambulatory Visit: Payer: Self-pay | Admitting: Cardiovascular Disease

## 2021-08-03 ENCOUNTER — Other Ambulatory Visit: Payer: Self-pay

## 2021-08-03 DIAGNOSIS — Z7901 Long term (current) use of anticoagulants: Secondary | ICD-10-CM

## 2021-08-03 DIAGNOSIS — Z79899 Other long term (current) drug therapy: Secondary | ICD-10-CM

## 2021-08-03 NOTE — Telephone Encounter (Signed)
Please see below RE: labs for Eliquis. Thank you!

## 2021-08-03 NOTE — Telephone Encounter (Signed)
Refill request

## 2021-08-03 NOTE — Telephone Encounter (Signed)
Prescription refill request for Eliquis received. Indication: Atrial Fib Last office visit: 07/06/21  Johnny Bridge MD Scr: 0.94 on 02/14/20 Age: 87 Weight: 68kg  Based on above findings Eliquis 5mg  twice daily is the appropriate dose.  Pt is past due for lab work.  Message sent to [facility] staff to order labs.  Refill approved x 1 only.

## 2021-08-03 NOTE — Progress Notes (Signed)
Malen Gauze, RN  Newcomer McClain, Brandy L 25 minutes ago (10:06 AM)   Pt is past due for Eliquis labs.  Needs CBC and BMP for future refills.  Please arrange.  Thx    Malen Gauze, RN 25 minutes ago (10:06 AM)   Prescription refill request for Eliquis received. Indication: Atrial Fib Last office visit: 07/06/21  Johnny Bridge MD Scr: 0.94 on 02/14/20 Age: 87 Weight: 68kg   Based on above findings Eliquis 5mg  twice daily is the appropriate dose.  Pt is past due for lab work.  Message sent to [facility] staff to order labs.  Refill approved x 1 only.     Order for CBC & BMP placed, pt needs appt for labs

## 2021-08-12 ENCOUNTER — Encounter (INDEPENDENT_AMBULATORY_CARE_PROVIDER_SITE_OTHER): Payer: Self-pay

## 2021-08-12 ENCOUNTER — Encounter: Payer: Self-pay | Admitting: Nurse Practitioner

## 2021-08-12 ENCOUNTER — Ambulatory Visit (INDEPENDENT_AMBULATORY_CARE_PROVIDER_SITE_OTHER): Payer: PPO | Admitting: Nurse Practitioner

## 2021-08-12 ENCOUNTER — Other Ambulatory Visit: Payer: Self-pay

## 2021-08-12 VITALS — BP 134/60 | HR 60 | Temp 98.3°F | Resp 16 | Ht 63.0 in | Wt 151.4 lb

## 2021-08-12 DIAGNOSIS — I10 Essential (primary) hypertension: Secondary | ICD-10-CM | POA: Diagnosis not present

## 2021-08-12 DIAGNOSIS — E119 Type 2 diabetes mellitus without complications: Secondary | ICD-10-CM | POA: Diagnosis not present

## 2021-08-12 DIAGNOSIS — E1165 Type 2 diabetes mellitus with hyperglycemia: Secondary | ICD-10-CM | POA: Diagnosis not present

## 2021-08-12 LAB — POCT GLYCOSYLATED HEMOGLOBIN (HGB A1C): Hemoglobin A1C: 6 % — AB (ref 4.0–5.6)

## 2021-08-12 MED ORDER — METFORMIN HCL 500 MG PO TABS: 500.0000 mg | ORAL_TABLET | Freq: Two times a day (BID) | ORAL | 1 refills | Status: DC

## 2021-08-12 NOTE — Progress Notes (Signed)
[facility] [place], [place] [address]  Internal MEDICINE  Office Visit Note  Patient Name: Vanessa Gentry  [id number]  [id number]  Date of Service: 08/12/2021  Chief Complaint  Patient presents with   Follow-up   Diabetes   Gastroesophageal Reflux   Hypertension   Medication Refill    HPI Vanessa Gentry presents for a follow-up visit for hypertension and diabetes.  Her A1c was 6.0 previously.  Her A1c today is 6.0 again.  Her metformin dose was decreased at her previous office visit and she is tolerating this change.  Her blood pressure is within normal limits she is taking metoprolol, amlodipine, olmesartan.  She also sees cardiology with Dr. Rockey Situ and he sent in prescription refills on October 31 for a years worth of refills of her metoprolol, amiodarone, amlodipine and olmesartan.       Current Medication: Outpatient Encounter Medications as of 08/12/2021  Medication Sig   acetaminophen (TYLENOL) 500 MG tablet Take 500 mg by mouth every 6 (six) hours as needed for moderate pain or headache.   amiodarone (PACERONE) 200 MG tablet Take 1 tablet (200 mg total) by mouth daily.   amLODipine (NORVASC) 5 MG tablet Take 1 tablet (5 mg total) by mouth daily.   apixaban (ELIQUIS) 5 MG TABS tablet TAKE 1 TABLET BY MOUTH TWICE A DAY   cetirizine (ZYRTEC) 10 MG tablet Take 10 mg by mouth daily as needed for allergies.   metoprolol tartrate (LOPRESSOR) 50 MG tablet Take 1 tablet (50 mg total) by mouth 2 (two) times daily.   olmesartan (BENICAR) 40 MG tablet Take 1 tablet (40 mg total) by mouth daily.   [DISCONTINUED] metFORMIN (GLUCOPHAGE) 500 MG tablet Take 1 tablet (500 mg total) by mouth 2 (two) times daily with a meal.   metFORMIN (GLUCOPHAGE) 500 MG tablet Take 1 tablet (500 mg total) by mouth 2 (two) times daily with a meal.   No facility-administered encounter medications on file as of 08/12/2021.    Surgical History: Past Surgical History:  Procedure  Laterality Date   ABDOMINAL HYSTERECTOMY     BREAST BIOPSY Left 2011   NEG   CARDIOVERSION N/A 10/12/2019   Procedure: CARDIOVERSION;  Surgeon: Minna Merritts, MD;  Location: [facility] ORS;  Service: Cardiovascular;  Laterality: N/A;   CARDIOVERSION N/A 02/06/2020   Procedure: CARDIOVERSION;  Surgeon: Kate Sable, MD;  Location: [facility] OR[facility];  Service: Cardiovascular;  Laterality: N/A;   CATARACT EXTRACTION W/PHACO Right 03/13/2015   Procedure: CATARACT EXTRACTION PHACO AND INTRAOCULAR LENS PLACEMENT ([place]);  Surgeon: Leandrew Koyanagi, MD;  Location: [facility] ORS;  Service: Ophthalmology;  Laterality: Right;  [place]  2:40  AP 20.3   CDE 32.41 cassette lot #   [id number]   CHOLECYSTECTOMY     ORIF ANKLE FRACTURE     TEE WITHOUT CARDIOVERSION N/A 10/12/2019   Procedure: TRANSESOPHAGEAL ECHOCARDIOGRAM (TEE);  Surgeon: Minna Merritts, MD;  Location: [facility] ORS;  Service: Cardiovascular;  Laterality: N/A;   UPPER GI ENDOSCOPY      Medical History: Past Medical History:  Diagnosis Date   A-fib ([place])    Arthritis    Diabetes mellitus without complication ([place])    Dysrhythmia    GERD (gastroesophageal reflux disease)    Hypertension    Pneumonia     Family History: Family History  Problem Relation Age of Onset   Heart failure Father    Heart disease Sister    Heart attack Sister    Diabetes Brother  Cancer Brother    Diabetes Daughter    Atrial fibrillation Daughter    Breast cancer Neg Hx     Social History   Socioeconomic History   Marital status: Divorced    Spouse name: Not on file   Number of children: Not on file   Years of education: Not on file   Highest education level: Not on file  Occupational History   Not on file  Tobacco Use   Smoking status: Never   Smokeless tobacco: Never  Vaping Use   Vaping Use: Never used  Substance and Sexual Activity   Alcohol use: No   Drug use: No   Sexual activity: Not on file  Other Topics Concern   Not on file  Social History  Narrative   Not on file   Social Determinants of Health   Financial Resource Strain: Not on file  Food Insecurity: Not on file  Transportation Needs: Not on file  Physical Activity: Not on file  Stress: Not on file  Social Connections: Not on file  Intimate Partner Violence: Not on file      Review of Systems  Constitutional:  Negative for chills, fatigue and unexpected weight change.  HENT:  Negative for congestion, rhinorrhea, sneezing and sore throat.   Eyes:  Negative for redness.  Respiratory:  Negative for cough, chest tightness and shortness of breath.   Cardiovascular:  Negative for chest pain and palpitations.  Gastrointestinal:  Negative for abdominal pain, constipation, diarrhea, nausea and vomiting.  Genitourinary:  Negative for dysuria and frequency.  Musculoskeletal:  Negative for arthralgias, back pain, joint swelling and neck pain.  Skin:  Negative for rash.  Neurological: Negative.  Negative for tremors and numbness.  Hematological:  Negative for adenopathy. Does not bruise/bleed easily.  Psychiatric/Behavioral:  Negative for behavioral problems (Depression), sleep disturbance and suicidal ideas. The patient is not nervous/anxious.    Vital Signs: BP 134/60   Pulse 60   Temp 98.3 F (36.8 C)   Resp 16   Ht 5\' 3"  (1.6 m)   Wt 151 lb 6.4 oz (68.7 kg)   SpO2 98%   BMI 26.82 kg/m    Physical Exam Vitals reviewed.  Constitutional:      General: She is not in acute distress.    Appearance: Normal appearance. She is normal weight. She is not ill-appearing.  HENT:     Head: Normocephalic and atraumatic.  Eyes:     Pupils: Pupils are equal, round, and reactive to light.  Cardiovascular:     Rate and Rhythm: Normal rate and regular rhythm.  Pulmonary:     Effort: Pulmonary effort is normal. No respiratory distress.  Neurological:     Mental Status: She is alert and oriented to person, place, and time.     Cranial Nerves: No cranial nerve deficit.      Coordination: Coordination normal.     Gait: Gait normal.  Psychiatric:        Mood and Affect: Mood normal.        Behavior: Behavior normal.       Assessment/Plan: 1. Type 2 diabetes mellitus with hyperglycemia, without long-term current use of insulin (HCC) Stable, continue metformin as prescribed.  - POCT HgB A1C - metFORMIN (GLUCOPHAGE) 500 MG tablet; Take 1 tablet (500 mg total) by mouth 2 (two) times daily with a meal.  Dispense: 180 tablet; Refill: 1  2. Essential hypertension Stable, followed by Dr. Rockey Situ, no refills needed   General Counseling: Drusilla Kanner  verbalizes understanding of the findings of todays visit and agrees with plan of treatment. I have discussed any further diagnostic evaluation that may be needed or ordered today. We also reviewed her medications today. she has been encouraged to call the office with any questions or concerns that should arise related to todays visit.    Orders Placed This Encounter  Procedures   POCT HgB A1C    Meds ordered this encounter  Medications   metFORMIN (GLUCOPHAGE) 500 MG tablet    Sig: Take 1 tablet (500 mg total) by mouth 2 (two) times daily with a meal.    Dispense:  180 tablet    Refill:  1    Return in about 3 months (around 11/10/2021) for F/U, Recheck A1C, Cadey Bazile PCP.   Total time spent:20 Minutes Time spent includes review of chart, medications, test results, and follow up plan with the patient.   [place] Controlled Substance Database was reviewed by me.  This patient was seen by Jonetta Osgood, FNP-C in collaboration with Dr. Clayborn Bigness as a part of collaborative care agreement.   Christopherjame Carnell R. Valetta Fuller, MSN, FNP-C Internal medicine

## 2021-08-19 ENCOUNTER — Other Ambulatory Visit: Payer: Self-pay | Admitting: Cardiovascular Disease

## 2021-08-19 NOTE — Telephone Encounter (Signed)
Prescription refill request for Eliquis received. Indication: Atrial fib Last office visit: 07/06/21  Johnny Bridge MD Scr: 0.94 on 02/14/20 Age: 87 Weight: 68kg  Based on above findings Eliquis 5mg  twice daily is the appropriate dose.  Past due for CBC/BMP.  Message sent back to staff to contact pt to get labs for future Eliquis refills.  Refill approved x 1.

## 2021-08-19 NOTE — Telephone Encounter (Signed)
Refill request

## 2021-09-03 ENCOUNTER — Other Ambulatory Visit: Payer: Self-pay | Admitting: Cardiovascular Disease

## 2021-09-17 ENCOUNTER — Other Ambulatory Visit: Payer: Self-pay | Admitting: Cardiovascular Disease

## 2021-09-17 NOTE — Telephone Encounter (Signed)
Prescription refill request for Eliquis received. Indication: Atrial fib Last office visit: 07/06/21  Johnny Bridge MD Scr: 0.94 on 02/14/20 Age: 88 Weight: 68kg  Based on above findings Eliquis 5mg  twice daily is the appropriate dose.  Patient is PAST DUE for lab work.  Needs CBC/BMP to assure correct Eliquis dose.  Message sent to Nurse to order.  Refill approved x 1.

## 2021-09-18 ENCOUNTER — Other Ambulatory Visit: Payer: Self-pay

## 2021-09-18 DIAGNOSIS — Z7901 Long term (current) use of anticoagulants: Secondary | ICD-10-CM

## 2021-09-18 NOTE — Telephone Encounter (Signed)
Attempted to schedule.  LMOV to call office.  ° °

## 2021-09-24 NOTE — Telephone Encounter (Signed)
Attempted to schedule.  

## 2021-10-09 ENCOUNTER — Telehealth: Payer: Self-pay | Admitting: Cardiovascular Disease

## 2021-10-09 NOTE — Telephone Encounter (Signed)
Prescription refill request for Eliquis received. Indication: afib  Last office visit: [place], 07/06/2021 Scr: 0.94, 02/14/2020 Age: 88 yo  Weight: 68.7 kg   Pt is overdue for blood work. Eliquis should also be taken twice a day. Pt was given a 1 month supply last refill and orders were placed for pt get labs.   Attempted to call pt- no answer. LMOM.

## 2021-10-09 NOTE — Telephone Encounter (Signed)
Refill request

## 2021-10-09 NOTE — Telephone Encounter (Signed)
°*  STAT* If patient is at the pharmacy, call can be transferred to refill team.   1. Which medications need to be refilled? (please list name of each medication and dose if known) Eliquis 5mg  1 tablet daily  2. Which pharmacy/location (including street and city if local pharmacy) is medication to be sent to? [facility]  3. Do they need a 30 day or 90 day supply? 30 day

## 2021-10-09 NOTE — Telephone Encounter (Signed)
Attempted to call pt. No answer.

## 2021-10-13 NOTE — Telephone Encounter (Signed)
Tried patient again.  No answer. LM on voice mail to call back.

## 2021-10-13 NOTE — Telephone Encounter (Signed)
Called pt and LMOM for her to call back.

## 2021-10-14 NOTE — Telephone Encounter (Signed)
Attempted to contact pt. Left message on her jitterbug vm to call back.

## 2021-10-16 ENCOUNTER — Other Ambulatory Visit: Payer: Self-pay | Admitting: Cardiovascular Disease

## 2021-10-16 NOTE — Telephone Encounter (Signed)
Received electronic refill request. Attempted again to contact pt. Left detailed message asking pt to call back to verify the dosage of Eliquis that she is taking and to schedule an appt for labs. Will deny the electronic refill in hopes that her pharmacy can relay the message to her, as well.

## 2021-10-16 NOTE — Telephone Encounter (Signed)
Refill request

## 2021-10-16 NOTE — Telephone Encounter (Signed)
Please see phone note.  Have been trying to reach pt regarding labs and to make sure she is taking this correctly.

## 2021-10-20 NOTE — Telephone Encounter (Signed)
Patient calling Scheduled for labs 02/15 States she takes Eliquis 5 MG 1 tablet twice daily  Please call with any further questions

## 2021-10-21 ENCOUNTER — Other Ambulatory Visit (INDEPENDENT_AMBULATORY_CARE_PROVIDER_SITE_OTHER): Payer: PPO

## 2021-10-21 ENCOUNTER — Other Ambulatory Visit: Payer: Self-pay

## 2021-10-21 DIAGNOSIS — Z7901 Long term (current) use of anticoagulants: Secondary | ICD-10-CM | POA: Diagnosis not present

## 2021-10-22 LAB — CBC
Hematocrit: 37.5 % (ref 34.0–46.6)
Hemoglobin: 12.6 g/dL (ref 11.1–15.9)
MCH: 30.3 pg (ref 26.6–33.0)
MCHC: 33.6 g/dL (ref 31.5–35.7)
MCV: 90 fL (ref 79–97)
Platelets: 258 10*3/uL (ref 150–450)
RBC: 4.16 x10E6/uL (ref 3.77–5.28)
RDW: 13.2 % (ref 11.7–15.4)
WBC: 5.9 10*3/uL (ref 3.4–10.8)

## 2021-10-22 LAB — BASIC METABOLIC PANEL
BUN/Creatinine Ratio: 22 (ref 12–28)
BUN: 22 mg/dL (ref 8–27)
CO2: 22 mmol/L (ref 20–29)
Calcium: 8.8 mg/dL (ref 8.7–10.3)
Chloride: 105 mmol/L (ref 96–106)
Creatinine, Ser: 0.98 mg/dL (ref 0.57–1.00)
Glucose: 97 mg/dL (ref 70–99)
Potassium: 4.3 mmol/L (ref 3.5–5.2)
Sodium: 142 mmol/L (ref 134–144)
eGFR: 56 mL/min/{1.73_m2} — ABNORMAL LOW (ref >59)

## 2021-10-22 MED ORDER — APIXABAN 5 MG PO TABS: 5.0000 mg | ORAL_TABLET | Freq: Two times a day (BID) | ORAL | 5 refills | Status: DC

## 2021-10-22 NOTE — Telephone Encounter (Signed)
Pt had labwork done 10/21/21 Creat 0.98, age 88, weight 68.7kg, based on specified criteria pt is on appropriate dosage of Eliquis 5mg  BID.  Will refill rx.

## 2021-10-27 ENCOUNTER — Telehealth: Payer: Self-pay | Admitting: Cardiovascular Disease

## 2021-10-27 NOTE — Telephone Encounter (Signed)
Patient calling to discuss recent lab testing results  ° °Please call  ° °

## 2021-10-27 NOTE — Telephone Encounter (Signed)
Return call to pt, advised results mailed to her, labs WNL. Pt thankful for return call.

## 2021-10-27 NOTE — Telephone Encounter (Signed)
Pt c/o medication issue:  1. Name of Medication: eliquis  2. How are you currently taking this medication (dosage and times per day)? 5 mg po BID  3. Are you having a reaction (difficulty breathing--STAT)? no  4. What is your medication issue? -   Patient told on hold .  [facility] and [facility] do not know why .  Per Levada Dy at [facility] refill will be made ready for pick up.    Patient aware and appreciates the assistance. Nothing further needed .  Closing encounter.

## 2021-11-11 ENCOUNTER — Ambulatory Visit (INDEPENDENT_AMBULATORY_CARE_PROVIDER_SITE_OTHER): Payer: PPO | Admitting: Nurse Practitioner

## 2021-11-11 ENCOUNTER — Encounter: Payer: Self-pay | Admitting: Nurse Practitioner

## 2021-11-11 ENCOUNTER — Other Ambulatory Visit: Payer: Self-pay

## 2021-11-11 VITALS — BP 140/62 | HR 60 | Temp 98.4°F | Resp 16 | Ht 63.0 in | Wt 150.6 lb

## 2021-11-11 DIAGNOSIS — R251 Tremor, unspecified: Secondary | ICD-10-CM | POA: Diagnosis not present

## 2021-11-11 DIAGNOSIS — E1165 Type 2 diabetes mellitus with hyperglycemia: Secondary | ICD-10-CM | POA: Diagnosis not present

## 2021-11-11 DIAGNOSIS — I10 Essential (primary) hypertension: Secondary | ICD-10-CM

## 2021-11-11 DIAGNOSIS — I480 Paroxysmal atrial fibrillation: Secondary | ICD-10-CM

## 2021-11-11 LAB — POCT GLYCOSYLATED HEMOGLOBIN (HGB A1C): Hemoglobin A1C: 6 % — AB (ref 4.0–5.6)

## 2021-11-11 NOTE — Progress Notes (Signed)
[facility] ?[address] ?[place], [place] [address] ? ?Internal MEDICINE  ?Office Visit Note ? ?Patient Name: Vanessa Gentry ? [id number]  ?[id number] ? ?Date of Service: 11/11/2021 ? ?Chief Complaint  ?Patient presents with  ? Follow-up  ?  Shaking, kidney stone has been passed  ? Diabetes  ? Gastroesophageal Reflux  ? Hypertension  ? ? ?HPI ?Vanessa Gentry presents for a follow-up visit for diabetes, hypertension and kidney stones.  Her A1c was 6.0 today which is stable and no change from her previous A1c in December.  She reports that her glucose levels have been fine at home.  She states that she recently passed a kidney stone and then had foul-smelling urine for a little while after passing the kidney stone but this has resolved.  She denies any symptoms of dysuria, urinary urgency or frequency, burning with urination or suprapubic discomfort. ?Patient reports that she feels like she is continuously shaking or like her body is in the tremor and she is wondering if any of her medications can cause this side effect..  Patient is on amiodarone for control of her atrial fibrillation.  This is prescribed by her cardiologist. ?Blood pressure is stable with current medications. ? ? ?Current Medication: ?Outpatient Encounter Medications as of 11/11/2021  ?Medication Sig  ? acetaminophen (TYLENOL) 500 MG tablet Take 500 mg by mouth every 6 (six) hours as needed for moderate pain or headache.  ? amiodarone (PACERONE) 200 MG tablet Take 1 tablet (200 mg total) by mouth daily.  ? amLODipine (NORVASC) 5 MG tablet Take 1 tablet (5 mg total) by mouth daily.  ? apixaban (ELIQUIS) 5 MG TABS tablet Take 1 tablet (5 mg total) by mouth 2 (two) times daily.  ? cetirizine (ZYRTEC) 10 MG tablet Take 10 mg by mouth daily as needed for allergies.  ? metFORMIN (GLUCOPHAGE) 500 MG tablet Take 1 tablet (500 mg total) by mouth 2 (two) times daily with a meal.  ? olmesartan (BENICAR) 40 MG tablet Take 1 tablet (40 mg total) by mouth  daily.  ? metoprolol tartrate (LOPRESSOR) 50 MG tablet Take 1 tablet (50 mg total) by mouth 2 (two) times daily.  ? ?No facility-administered encounter medications on file as of 11/11/2021.  ? ? ?Surgical History: ?Past Surgical History:  ?Procedure Laterality Date  ? ABDOMINAL HYSTERECTOMY    ? BREAST BIOPSY Left 2011  ? NEG  ? CARDIOVERSION N/A 10/12/2019  ? Procedure: CARDIOVERSION;  Surgeon: Minna Merritts, MD;  Location: [facility];  Service: Cardiovascular;  Laterality: N/A;  ? CARDIOVERSION N/A 02/06/2020  ? Procedure: CARDIOVERSION;  Surgeon: Kate Sable, MD;  Location: [facility];  Service: Cardiovascular;  Laterality: N/A;  ? CATARACT EXTRACTION W/PHACO Right 03/13/2015  ? Procedure: CATARACT EXTRACTION PHACO AND INTRAOCULAR LENS PLACEMENT ([facility]);  Surgeon: Leandrew Koyanagi, MD;  Location: [facility];  Service: Ophthalmology;  Laterality: Right;  [place]  2:40  AP 20.3   CDE 32.41 cassette lot #   [id number]  ? CHOLECYSTECTOMY    ? ORIF ANKLE FRACTURE    ? TEE WITHOUT CARDIOVERSION N/A 10/12/2019  ? Procedure: TRANSESOPHAGEAL ECHOCARDIOGRAM (TEE);  Surgeon: Minna Merritts, MD;  Location: [facility];  Service: Cardiovascular;  Laterality: N/A;  ? UPPER GI ENDOSCOPY    ? ? ?Medical History: ?Past Medical History:  ?Diagnosis Date  ? A-fib ([place])   ? Arthritis   ? Diabetes mellitus without complication ([place])   ? Dysrhythmia   ? GERD (gastroesophageal reflux disease)   ? Hypertension   ?  Pneumonia   ? ? ?Family History: ?Family History  ?Problem Relation Age of Onset  ? Heart failure Father   ? Heart disease Sister   ? Heart attack Sister   ? Heart disease Brother   ? Diabetes Brother   ? Cancer Brother   ? Diabetes Daughter   ? Atrial fibrillation Daughter   ? Breast cancer Neg Hx   ? ? ?Social History  ? ?Socioeconomic History  ? Marital status: Divorced  ?  Spouse name: Not on file  ? Number of children: Not on file  ? Years of education: Not on file  ? Highest education level: Not on file  ?Occupational History  ?  Not on file  ?Tobacco Use  ? Smoking status: Never  ? Smokeless tobacco: Never  ?Vaping Use  ? Vaping Use: Never used  ?Substance and Sexual Activity  ? Alcohol use: No  ? Drug use: No  ? Sexual activity: Not on file  ?Other Topics Concern  ? Not on file  ?Social History Narrative  ? Not on file  ? ?Social Determinants of Health  ? ?Financial Resource Strain: Not on file  ?Food Insecurity: Not on file  ?Transportation Needs: Not on file  ?Physical Activity: Not on file  ?Stress: Not on file  ?Social Connections: Not on file  ?Intimate Partner Violence: Not on file  ? ? ? ? ?Review of Systems  ?Constitutional:  Negative for chills, fatigue and unexpected weight change.  ?HENT:  Negative for congestion, rhinorrhea, sneezing and sore throat.   ?Eyes:  Negative for redness.  ?Respiratory: Negative.  Negative for cough, chest tightness, shortness of breath and wheezing.   ?Cardiovascular: Negative.  Negative for chest pain and palpitations.  ?Gastrointestinal:  Negative for abdominal pain, constipation, diarrhea, nausea and vomiting.  ?Genitourinary: Negative.  Negative for dysuria and frequency.  ?Musculoskeletal:  Negative for arthralgias, back pain, joint swelling and neck pain.  ?Skin:  Negative for rash.  ?Neurological:  Positive for tremors. Negative for numbness.  ?Hematological:  Negative for adenopathy. Does not bruise/bleed easily.  ?Psychiatric/Behavioral:  Negative for behavioral problems (Depression), sleep disturbance and suicidal ideas. The patient is not nervous/anxious.   ? ?Vital Signs: ?BP 140/62   Pulse 60   Temp 98.4 ?F (36.9 ?C)   Resp 16   Ht '5\' 3"'$  (1.6 m)   Wt 150 lb 9.6 oz (68.3 kg)   SpO2 98%   BMI 26.68 kg/m?  ? ? ?Physical Exam ?Vitals reviewed.  ?Constitutional:   ?   General: She is not in acute distress. ?   Appearance: Normal appearance. She is normal weight. She is not ill-appearing.  ?HENT:  ?   Head: Normocephalic and atraumatic.  ?Eyes:  ?   Pupils: Pupils are equal, round, and  reactive to light.  ?Cardiovascular:  ?   Rate and Rhythm: Normal rate and regular rhythm.  ?Pulmonary:  ?   Effort: Pulmonary effort is normal. No respiratory distress.  ?Neurological:  ?   Mental Status: She is alert and oriented to person, place, and time.  ?Psychiatric:     ?   Mood and Affect: Mood normal.     ?   Behavior: Behavior normal.  ? ? ? ? ? ?Assessment/Plan: ?1. Type 2 diabetes mellitus with hyperglycemia, without long-term current use of insulin ([place]) ?A1C remains stable and unchanged, follow up in 3 months for repeat A1C.  ?- POCT HgB A1C ? ?2. Excessive physiologic tremor ?Patient has a tremor that  is causing her body to shake. She takes amiodarone for A-fib. Amiodarone can cause a tremor. Patient will discuss the medication with her cardiologist.  ? ?3. Essential hypertension ?Stable on current medications.  ? ?4. Paroxysmal atrial fibrillation (HCC) ?Rhythm is controlled with amidarone and rate controlled with metoprolol. Patient will discuss the amiodarone with her cardilogist due to unwanted side effects at her next office visit with them.  ? ? ?General Counseling: Jesslyn verbalizes understanding of the findings of todays visit and agrees with plan of treatment. I have discussed any further diagnostic evaluation that may be needed or ordered today. We also reviewed her medications today. she has been encouraged to call the office with any questions or concerns that should arise related to todays visit. ? ? ? ?Orders Placed This Encounter  ?Procedures  ? POCT HgB A1C  ? ? ?No orders of the defined types were placed in this encounter. ? ? ?Return in about 3 months (around 02/11/2022) for F/U, Recheck A1C, Torben Soloway PCP. ? ? ?Total time spent:30 Minutes ?Time spent includes review of chart, medications, test results, and follow up plan with the patient.  ? ?[place] Controlled Substance Database was reviewed by me. ? ?This patient was seen by Jonetta Osgood, FNP-C in collaboration with Dr. Clayborn Bigness as  a part of collaborative care agreement. ? ? ?Miyoshi Ligas R. Valetta Fuller, MSN, FNP-C ?Internal medicine  ?

## 2021-11-12 ENCOUNTER — Encounter: Payer: Self-pay | Admitting: Nurse Practitioner

## 2022-01-26 DIAGNOSIS — I10 Essential (primary) hypertension: Secondary | ICD-10-CM | POA: Diagnosis not present

## 2022-01-26 DIAGNOSIS — I4819 Other persistent atrial fibrillation: Secondary | ICD-10-CM | POA: Diagnosis not present

## 2022-01-26 DIAGNOSIS — Z7689 Persons encountering health services in other specified circumstances: Secondary | ICD-10-CM | POA: Diagnosis not present

## 2022-01-26 DIAGNOSIS — I428 Other cardiomyopathies: Secondary | ICD-10-CM | POA: Diagnosis not present

## 2022-01-26 DIAGNOSIS — R8290 Unspecified abnormal findings in urine: Secondary | ICD-10-CM | POA: Diagnosis not present

## 2022-01-26 DIAGNOSIS — Z7901 Long term (current) use of anticoagulants: Secondary | ICD-10-CM | POA: Diagnosis not present

## 2022-01-26 DIAGNOSIS — E538 Deficiency of other specified B group vitamins: Secondary | ICD-10-CM | POA: Diagnosis not present

## 2022-01-26 DIAGNOSIS — E1165 Type 2 diabetes mellitus with hyperglycemia: Secondary | ICD-10-CM | POA: Diagnosis not present

## 2022-01-26 DIAGNOSIS — I3481 Nonrheumatic mitral (valve) annulus calcification: Secondary | ICD-10-CM | POA: Diagnosis not present

## 2022-02-10 ENCOUNTER — Ambulatory Visit: Payer: PPO | Admitting: Nurse Practitioner

## 2022-04-10 ENCOUNTER — Other Ambulatory Visit: Payer: Self-pay | Admitting: Nurse Practitioner

## 2022-04-10 DIAGNOSIS — E1165 Type 2 diabetes mellitus with hyperglycemia: Secondary | ICD-10-CM

## 2022-04-25 ENCOUNTER — Other Ambulatory Visit: Payer: Self-pay | Admitting: Nurse Practitioner

## 2022-04-25 DIAGNOSIS — E1165 Type 2 diabetes mellitus with hyperglycemia: Secondary | ICD-10-CM

## 2022-05-05 ENCOUNTER — Other Ambulatory Visit: Payer: Self-pay | Admitting: Cardiovascular Disease

## 2022-05-05 NOTE — Telephone Encounter (Signed)
Prescription refill request for Eliquis received. Indication:Afib Last office visit:10/22 Scr:1.1 Age: 88 Weight:68.3 kg  Prescription refilled

## 2022-05-05 NOTE — Telephone Encounter (Signed)
Refill request

## 2022-05-12 ENCOUNTER — Other Ambulatory Visit: Payer: Self-pay | Admitting: Internal Medicine

## 2022-05-12 ENCOUNTER — Other Ambulatory Visit (HOSPITAL_COMMUNITY): Payer: Self-pay | Admitting: Internal Medicine

## 2022-05-12 ENCOUNTER — Ambulatory Visit
Admission: RE | Admit: 2022-05-12 | Discharge: 2022-05-12 | Disposition: A | Discharge status: Home / Self Care | Payer: PPO | Source: Ambulatory Visit | Attending: Internal Medicine | Admitting: Internal Medicine

## 2022-05-12 DIAGNOSIS — M79605 Pain in left leg: Secondary | ICD-10-CM | POA: Insufficient documentation

## 2022-05-12 DIAGNOSIS — Z7901 Long term (current) use of anticoagulants: Secondary | ICD-10-CM | POA: Diagnosis not present

## 2022-05-12 DIAGNOSIS — I4819 Other persistent atrial fibrillation: Secondary | ICD-10-CM | POA: Diagnosis not present

## 2022-05-12 DIAGNOSIS — I700 Atherosclerosis of aorta: Secondary | ICD-10-CM | POA: Diagnosis not present

## 2022-05-12 DIAGNOSIS — M7989 Other specified soft tissue disorders: Secondary | ICD-10-CM

## 2022-05-12 DIAGNOSIS — E1165 Type 2 diabetes mellitus with hyperglycemia: Secondary | ICD-10-CM | POA: Diagnosis not present

## 2022-05-12 DIAGNOSIS — I10 Essential (primary) hypertension: Secondary | ICD-10-CM | POA: Diagnosis not present

## 2022-05-19 ENCOUNTER — Ambulatory Visit: Payer: PPO | Admitting: Nurse Practitioner

## 2022-06-02 DIAGNOSIS — E538 Deficiency of other specified B group vitamins: Secondary | ICD-10-CM | POA: Diagnosis not present

## 2022-06-02 DIAGNOSIS — Z0000 Encounter for general adult medical examination without abnormal findings: Secondary | ICD-10-CM | POA: Diagnosis not present

## 2022-06-02 DIAGNOSIS — Z87442 Personal history of urinary calculi: Secondary | ICD-10-CM | POA: Diagnosis not present

## 2022-06-02 DIAGNOSIS — Z7901 Long term (current) use of anticoagulants: Secondary | ICD-10-CM | POA: Diagnosis not present

## 2022-06-02 DIAGNOSIS — I10 Essential (primary) hypertension: Secondary | ICD-10-CM | POA: Diagnosis not present

## 2022-06-02 DIAGNOSIS — I3481 Nonrheumatic mitral (valve) annulus calcification: Secondary | ICD-10-CM | POA: Diagnosis not present

## 2022-06-02 DIAGNOSIS — I4819 Other persistent atrial fibrillation: Secondary | ICD-10-CM | POA: Diagnosis not present

## 2022-06-02 DIAGNOSIS — I700 Atherosclerosis of aorta: Secondary | ICD-10-CM | POA: Diagnosis not present

## 2022-06-02 DIAGNOSIS — E1165 Type 2 diabetes mellitus with hyperglycemia: Secondary | ICD-10-CM | POA: Diagnosis not present

## 2022-06-03 DIAGNOSIS — E538 Deficiency of other specified B group vitamins: Secondary | ICD-10-CM | POA: Diagnosis not present

## 2022-06-03 DIAGNOSIS — I4819 Other persistent atrial fibrillation: Secondary | ICD-10-CM | POA: Diagnosis not present

## 2022-06-03 DIAGNOSIS — Z0000 Encounter for general adult medical examination without abnormal findings: Secondary | ICD-10-CM | POA: Diagnosis not present

## 2022-06-03 DIAGNOSIS — I3481 Nonrheumatic mitral (valve) annulus calcification: Secondary | ICD-10-CM | POA: Diagnosis not present

## 2022-06-03 DIAGNOSIS — I10 Essential (primary) hypertension: Secondary | ICD-10-CM | POA: Diagnosis not present

## 2022-06-03 DIAGNOSIS — E1165 Type 2 diabetes mellitus with hyperglycemia: Secondary | ICD-10-CM | POA: Diagnosis not present

## 2022-06-03 DIAGNOSIS — Z7901 Long term (current) use of anticoagulants: Secondary | ICD-10-CM | POA: Diagnosis not present

## 2022-06-03 DIAGNOSIS — Z87442 Personal history of urinary calculi: Secondary | ICD-10-CM | POA: Diagnosis not present

## 2022-06-03 DIAGNOSIS — I700 Atherosclerosis of aorta: Secondary | ICD-10-CM | POA: Diagnosis not present

## 2022-06-18 DIAGNOSIS — L918 Other hypertrophic disorders of the skin: Secondary | ICD-10-CM | POA: Diagnosis not present

## 2022-07-03 NOTE — Progress Notes (Unsigned)
Cardiology Office Note  Date:  07/05/2022   ID:  Vanessa Gentry, Vanessa Gentry 01, 1934, MRN [id number]  PCP:  Vanessa Harrier, MD   Chief Complaint  Patient presents with   12 month follow up     "Doing well." Medications reviewed by the patient verbally.     HPI:  88 y.o. female with history of  hypertension,  diabetes,  hospital February 2021 , new onset atrial fibrillation with RVR, mildly reduced EF of 45 to 50%  Secondary to challenging rate control efforts, underwent TEE cardioversion Aortic atherosclerosis She presents today for follow-up of her atrial fibrillation  LOV 10/22 In follow-up today she reports that she feels well No arrhythmia or palpitations Some rare chest pain/axillary area, once every couple of months Medium intensity, resolved without intervention Not associated with ambulation or activity  No recent hospitalizations Has not been checking blood pressure at home Appreciated lower extremity swelling, thought it was from weight gain  Lab work reviewed A1c 6.7 Total chol 188, LDL 114  Lives alone, sedentary No falls Son helps Does her own shopping and ADLs  EKG personally reviewed by myself on todays visit Sinus bradycardia rate 56 bpm no significant ST or T wave changes  Other past medical history reviewed  hospital February 2021 TEE performed October 12, 2019 followed by cardioversion, normal sinus rhythm   Estimated ejection fraction was 45%.  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   PMH:   has a past medical history of A-fib (Vanessa Gentry), Arthritis, Diabetes mellitus without complication (Vanessa Gentry), Dysrhythmia, GERD (gastroesophageal reflux disease), Hypertension, and  Pneumonia.  PSH:    Past Surgical History:  Procedure Laterality Date   ABDOMINAL HYSTERECTOMY     BREAST BIOPSY Left 2011   NEG   CARDIOVERSION N/A 10/12/2019   Procedure: CARDIOVERSION;  Surgeon: Minna Merritts, MD;  Location: [facility];  Service: Cardiovascular;  Laterality: N/A;   CARDIOVERSION N/A 02/06/2020   Procedure: CARDIOVERSION;  Surgeon: Kate Sable, MD;  Location: [facility];  Service: Cardiovascular;  Laterality: N/A;   CATARACT EXTRACTION W/PHACO Right 03/13/2015   Procedure: CATARACT EXTRACTION PHACO AND INTRAOCULAR LENS PLACEMENT (Vanessa Gentry);  Surgeon: Leandrew Koyanagi, MD;  Location: [facility];  Service: Ophthalmology;  Laterality: Right;  [place]  2:40  AP 20.3   CDE 32.41 cassette lot #   [id number]   CHOLECYSTECTOMY     ORIF ANKLE FRACTURE     TEE WITHOUT CARDIOVERSION N/A 10/12/2019   Procedure: TRANSESOPHAGEAL ECHOCARDIOGRAM (TEE);  Surgeon: Minna Merritts, MD;  Location: [facility];  Service: Cardiovascular;  Laterality: N/A;   UPPER GI ENDOSCOPY      Current Outpatient Medications  Medication Sig Dispense Refill   acetaminophen (TYLENOL) 500 MG tablet Take 500 mg by mouth every 6 (six) hours as needed for moderate pain or headache.     amiodarone (PACERONE) 200 MG tablet Take 1 tablet (200 mg total) by mouth daily. 90 tablet 3   amLODipine (NORVASC) 5 MG tablet Take 1 tablet (5 mg total) by mouth daily. 90 tablet 3   cetirizine (ZYRTEC) 10 MG tablet Take 10 mg by mouth daily as needed for allergies.     ELIQUIS 5 MG TABS tablet TAKE 1 TABLET BY MOUTH TWICE A DAY 60 tablet  5   metFORMIN (GLUCOPHAGE) 500 MG tablet TAKE 1 TABLET BY MOUTH TWICE A DAY WITH FOOD 180 tablet 1   metoprolol tartrate (LOPRESSOR) 50 MG tablet Take 1 tablet (50 mg total) by mouth 2 (two) times daily. 180 tablet 3   olmesartan (BENICAR) 40 MG tablet Take 1 tablet (40 mg total) by mouth daily. 90 tablet 3   No current facility-administered medications for this visit.    Allergies:   Metoprolol  succinate [metoprolol], Sulfa antibiotics, and Iodinated contrast media   Social History:  The patient  reports that she has never smoked. She has never used smokeless tobacco. She reports that she does not drink alcohol and does not use drugs.   Family History:   family history includes Atrial fibrillation in her daughter; Cancer in her brother; Diabetes in her brother and daughter; Heart attack in her sister; Heart disease in her brother and sister; Heart failure in her father.    Review of Systems: Review of Systems  Constitutional: Negative.   HENT: Negative.    Respiratory: Negative.    Cardiovascular: Negative.   Gastrointestinal: Negative.   Musculoskeletal: Negative.   Neurological: Negative.   Psychiatric/Behavioral: Negative.    All other systems reviewed and are negative.   PHYSICAL EXAM: VS:  BP (!) 150/58 (BP Location: Left Arm, Patient Position: Sitting, Cuff Size: Normal)   Pulse (!) 56   Ht '5\' 3"'$  (1.6 m)   Wt 151 lb 6 oz (68.7 kg)   SpO2 96%   BMI 26.81 kg/m  , BMI Body mass index is 26.81 kg/m. Constitutional:  oriented to person, place, and time. No distress.  HENT:  Head: Grossly normal Eyes:  no discharge. No scleral icterus.  Neck: No JVD, no carotid bruits  Cardiovascular: Regular rate and rhythm, no murmurs appreciated Trace pedal Adderall lower extremity edema Pulmonary/Chest: Clear to auscultation bilaterally, no wheezes or rails Abdominal: Soft.  no distension.  no tenderness.  Musculoskeletal: Normal range of motion Neurological:  normal muscle tone. Coordination normal. No atrophy Skin: Skin warm and dry Psychiatric: normal affect, pleasant   Recent Labs: 10/21/2021: BUN 22; Creatinine, Ser 0.98; Hemoglobin 12.6; Platelets 258; Potassium 4.3; Sodium 142    Lipid Panel Lab Results  Component Value Date   CHOL 150 10/07/2019   HDL 41 10/07/2019   LDLCALC 96 10/07/2019   TRIG 64 10/07/2019      Wt Readings from Last 3 Encounters:   07/05/22 151 lb 6 oz (68.7 kg)  11/11/21 150 lb 9.6 oz (68.3 kg)  08/12/21 151 lb 6.4 oz (68.7 kg)     ASSESSMENT AND PLAN:  Problem List Items Addressed This Visit       Cardiology Problems   HFrEF (heart failure with reduced ejection fraction) ([place])   Relevant Orders   EKG 12-Lead   Aortic atherosclerosis (HCC)   Cardiomyopathy ([place]) - Primary   Relevant Orders   EKG 12-Lead   Essential hypertension   Other Visit Diagnoses     Persistent atrial fibrillation (HCC)       Relevant Orders   EKG 12-Lead   Pleural effusion           A. Fib/flutter RVR Recommend she continue current medications maintaining amiodarone, metoprolol 50 twice daily, anticoagulation with Eliquis Maintain normal sinus rhythm   Essential hypertension olmesartan 40 daily and metoprolol  Recommend she start HCTZ 12.5 mg daily BP elevated on today's visit even on recheck  Aortic atherosclerosis Statin held for  polypharmacy and  medication confusion  Leg edema Trace, possibly exacerbated by amlodipine Recommend she start HCTZ 12.5 mg daily, leg elevation, compression hose   Total encounter time more than 30 minutes  Greater than 50% was spent in counseling and coordination of care with the patient    Signed, Esmond Plants, M.D., Ph.D. [place], [place]

## 2022-07-05 ENCOUNTER — Ambulatory Visit: Payer: PPO | Attending: Cardiovascular Disease | Admitting: Cardiovascular Disease

## 2022-07-05 ENCOUNTER — Encounter: Payer: Self-pay | Admitting: Cardiovascular Disease

## 2022-07-05 VITALS — BP 150/60 | HR 56 | Ht 63.0 in | Wt 151.4 lb

## 2022-07-05 DIAGNOSIS — I10 Essential (primary) hypertension: Secondary | ICD-10-CM

## 2022-07-05 DIAGNOSIS — I700 Atherosclerosis of aorta: Secondary | ICD-10-CM

## 2022-07-05 DIAGNOSIS — I4819 Other persistent atrial fibrillation: Secondary | ICD-10-CM | POA: Diagnosis not present

## 2022-07-05 DIAGNOSIS — J90 Pleural effusion, not elsewhere classified: Secondary | ICD-10-CM | POA: Diagnosis not present

## 2022-07-05 DIAGNOSIS — I5020 Unspecified systolic (congestive) heart failure: Secondary | ICD-10-CM | POA: Diagnosis not present

## 2022-07-05 DIAGNOSIS — I420 Dilated cardiomyopathy: Secondary | ICD-10-CM | POA: Diagnosis not present

## 2022-07-05 MED ORDER — HYDROCHLOROTHIAZIDE 12.5 MG PO CAPS: 12.5000 mg | ORAL_CAPSULE | Freq: Every day | ORAL | 3 refills | Status: DC

## 2022-07-05 NOTE — Patient Instructions (Addendum)
Medication Instructions:  - Your physician has recommended you make the following change in your medication:   1) START HCTZ (hydrochlorothiazide) 12.5 mg: - take 1 tablet by mouth once daily  If you need a refill on your cardiac medications before your next appointment, please call your pharmacy.    Lab work: No new labs needed   Testing/Procedures: No new testing needed   Follow-Up: At [facility], you and your health needs are our priority.  As part of our continuing mission to provide you with exceptional heart care, we have created designated Provider Care Teams.  These Care Teams include your primary Cardiologist (physician) and Advanced Practice Providers (APPs -  Physician Assistants and Nurse Practitioners) who all work together to provide you with the care you need, when you need it.  You will need a follow up appointment in 12 months  Providers on your designated Care Team:   Murray Hodgkins, NP Christell Faith, PA-C Cadence Kathlen Mody, [place]  COVID-19 Vaccine Information can be found at: [email or web address].uk For questions related to vaccine distribution or appointments, please email [email or web address] or call [phone].     Hydrochlorothiazide Capsules or Tablets What is this medication? HYDROCHLOROTHIAZIDE (hye droe klor oh THYE a zide) treats high blood pressure. It may also be used to reduce swelling related to heart, kidney, or liver disease. It helps your kidneys remove more fluid and salt from your blood through the urine. It belongs to a group of medications called diuretics. This medicine may be used for other purposes; ask your health care provider or pharmacist if you have questions. COMMON BRAND NAME(S): Esidrix, Ezide, HydroDIURIL, Microzide, Oretic, Zide What should I tell my care team before I take this medication? They need to know if you have any of these conditions: Diabetes Gout Kidney  disease Liver disease Lupus Pancreatitis An unusual or allergic reaction to hydrochlorothiazide, other medications, foods, dyes, or preservatives Pregnant or trying to get pregnant Breastfeeding How should I use this medication? Take this medication by mouth. Take it as directed on the prescription label at the same time every day. You can take it with or without food. If it upsets your stomach, take it with food. Keep taking it unless your care team tells you to stop. Talk to your care team about the use of this medication in children. While it may be prescribed for children as young as newborns for selected conditions, precautions do apply. Overdosage: If you think you have taken too much of this medicine contact a poison control center or emergency room at once. NOTE: This medicine is only for you. Do not share this medicine with others. What if I miss a dose? If you miss a dose, take it as soon as you can. If it is almost time for your next dose, take only that dose. Do not take double or extra doses. What may interact with this medication? Cholestyramine Colestipol Digoxin Dofetilide Lithium Medications for blood pressure Medications for diabetes Medications that relax muscles for surgery Other diuretics Steroid medications, such as prednisone or cortisone This list may not describe all possible interactions. Give your health care provider a list of all the medicines, herbs, non-prescription drugs, or dietary supplements you use. Also tell them if you smoke, drink alcohol, or use illegal drugs. Some items may interact with your medicine. What should I watch for while using this medication? Visit your care team for regular checks on your progress. Check your blood pressure as directed. Know what your blood pressure should  be and when to contact your care team. Do not treat yourself for coughs, colds, or pain while you are using this medication without asking your care team for advice.  Some medications may increase your blood pressure. This medication may affect your coordination, reaction time, or judgment. Do not drive or operate machinery until you know how this medication affects you. Sit up or stand slowly to reduce the risk of dizzy or fainting spells. Drinking alcohol with this medication can increase the risk of these side effects. Talk to your care team about your risk of skin cancer. You may be more at risk for skin cancer if you take this medication. This medication can make you more sensitive to the sun. Keep out of the sun. If you cannot avoid being in the sun, wear protective clothing and use sunscreen. Do not use sun lamps or tanning beds/booths. You may need to be on a special diet while taking this medication. Ask your care team. Also, find out how many glasses of fluids you need to drink each day. Check with your care team if you get an attack of severe diarrhea, nausea and vomiting, or if you sweat a lot. The loss of too much body fluid can make it dangerous for you to take this medication. This medication may increase blood sugar. Ask your care team if changes in diet or medications are needed if you have diabetes. What side effects may I notice from receiving this medication? Side effects that you should report to your care team as soon as possible: Allergic reactions--skin rash, itching, hives, swelling of the face, lips, tongue, or throat Dehydration--increased thirst, dry mouth, feeling faint or lightheaded, headache, dark yellow or brown urine Gout--severe pain, redness, warmth, or swelling in joints, such as the big toe Kidney injury--decrease in the amount of urine, swelling of the ankles, hands, or feet Low blood pressure--dizziness, feeling faint or lightheaded, blurry vision Low potassium level--muscle pain or cramps, unusual weakness, fatigue, fast or irregular heartbeat, constipation Sudden eye pain or change in vision such as blurred vision, seeing  halos around lights, vision loss Side effects that usually do not require medical attention (report to your care team if they continue or are bothersome): Change in sex drive or performance Headache Upset stomach This list may not describe all possible side effects. Call your doctor for medical advice about side effects. You may report side effects to FDA at 1-800-FDA-1088. Where should I keep my medication? Keep out of the reach of children and pets. Store at room temperature between 20 and 25 degrees C (68 and 77 degrees F). Protect from light and moisture. Keep the container tightly closed. Do not freeze. Get rid of any unused medication after the expiration date. To get rid of medications that are no longer needed or have expired: Take the medication to a medication take-back program. Check with your pharmacy or law enforcement to find a location. If you cannot return the medication, check the label or package insert to see if the medication should be thrown out in the garbage or flushed down the toilet. If you are not sure, ask your care team. If it is safe to put in the trash, empty the medication out of the container. Mix the medication with cat litter, dirt, coffee grounds, or other unwanted substance. Seal the mixture in a bag or container. Put it in the trash. NOTE: This sheet is a summary. It may not cover all possible information. If you have questions  about this medicine, talk to your doctor, pharmacist, or health care provider.  2023 Elsevier/Gold Standard (2021-05-12 00:00:00)

## 2022-07-09 DIAGNOSIS — D2372 Other benign neoplasm of skin of left lower limb, including hip: Secondary | ICD-10-CM | POA: Diagnosis not present

## 2022-07-09 DIAGNOSIS — D2272 Melanocytic nevi of left lower limb, including hip: Secondary | ICD-10-CM | POA: Diagnosis not present

## 2022-07-09 DIAGNOSIS — R208 Other disturbances of skin sensation: Secondary | ICD-10-CM | POA: Diagnosis not present

## 2022-07-21 ENCOUNTER — Other Ambulatory Visit: Payer: Self-pay | Admitting: Cardiovascular Disease

## 2022-07-21 DIAGNOSIS — I10 Essential (primary) hypertension: Secondary | ICD-10-CM

## 2022-09-02 ENCOUNTER — Other Ambulatory Visit: Payer: Self-pay | Admitting: Cardiovascular Disease

## 2022-09-11 ENCOUNTER — Other Ambulatory Visit: Payer: Self-pay | Admitting: Cardiovascular Disease

## 2022-10-18 ENCOUNTER — Other Ambulatory Visit: Payer: Self-pay | Admitting: Cardiovascular Disease

## 2022-10-18 NOTE — Telephone Encounter (Signed)
Pt last saw Dr Rockey Situ 07/05/22, last labs 06/02/22 Creat 1.0, age 89, weight 68.7, based on specified criteria pt is on appropriate dosage of Eliquis 98m BID for afib.  Will refill rx.

## 2022-10-18 NOTE — Telephone Encounter (Signed)
Refill Request.  

## 2022-12-21 DIAGNOSIS — I10 Essential (primary) hypertension: Secondary | ICD-10-CM | POA: Diagnosis not present

## 2022-12-21 DIAGNOSIS — R413 Other amnesia: Secondary | ICD-10-CM | POA: Diagnosis not present

## 2022-12-21 DIAGNOSIS — I420 Dilated cardiomyopathy: Secondary | ICD-10-CM | POA: Diagnosis not present

## 2022-12-21 DIAGNOSIS — E1165 Type 2 diabetes mellitus with hyperglycemia: Secondary | ICD-10-CM | POA: Diagnosis not present

## 2022-12-21 DIAGNOSIS — I4819 Other persistent atrial fibrillation: Secondary | ICD-10-CM | POA: Diagnosis not present

## 2022-12-21 DIAGNOSIS — Z0000 Encounter for general adult medical examination without abnormal findings: Secondary | ICD-10-CM | POA: Diagnosis not present

## 2022-12-21 DIAGNOSIS — Z7901 Long term (current) use of anticoagulants: Secondary | ICD-10-CM | POA: Diagnosis not present

## 2022-12-21 DIAGNOSIS — I700 Atherosclerosis of aorta: Secondary | ICD-10-CM | POA: Diagnosis not present

## 2022-12-21 DIAGNOSIS — Z13820 Encounter for screening for osteoporosis: Secondary | ICD-10-CM | POA: Diagnosis not present

## 2023-01-19 ENCOUNTER — Other Ambulatory Visit: Payer: Self-pay | Admitting: Cardiovascular Disease

## 2023-04-08 ENCOUNTER — Other Ambulatory Visit: Payer: Self-pay | Admitting: Nurse Practitioner

## 2023-04-08 DIAGNOSIS — E1165 Type 2 diabetes mellitus with hyperglycemia: Secondary | ICD-10-CM

## 2023-04-19 ENCOUNTER — Other Ambulatory Visit: Payer: Self-pay | Admitting: Cardiovascular Disease

## 2023-04-20 NOTE — Telephone Encounter (Signed)
Please contact pt for future appointment. Pt due for 12 month f/u. 

## 2023-04-21 ENCOUNTER — Telehealth: Payer: Self-pay | Admitting: Cardiovascular Disease

## 2023-04-21 NOTE — Telephone Encounter (Signed)
Left voicemail, pt needs to be scheduled for appt from recall

## 2023-04-21 NOTE — Telephone Encounter (Signed)
Left voice mail to schedule appt

## 2023-04-25 ENCOUNTER — Telehealth: Payer: Self-pay | Admitting: Cardiovascular Disease

## 2023-04-25 NOTE — Telephone Encounter (Signed)
Left voice mail

## 2023-04-25 NOTE — Telephone Encounter (Signed)
Left voice mail, needs to schedule 12 mo appt from recall.

## 2023-04-28 ENCOUNTER — Telehealth: Payer: Self-pay | Admitting: Cardiovascular Disease

## 2023-04-28 ENCOUNTER — Encounter: Payer: Self-pay | Admitting: Cardiovascular Disease

## 2023-04-28 NOTE — Telephone Encounter (Signed)
Called 3x, left message. Unable to reach letter being sent to patient via mail.

## 2023-04-28 NOTE — Telephone Encounter (Signed)
 Called 3x, unable to reach letter sent via mail

## 2023-06-23 DIAGNOSIS — D519 Vitamin B12 deficiency anemia, unspecified: Secondary | ICD-10-CM | POA: Diagnosis not present

## 2023-06-23 DIAGNOSIS — Z7901 Long term (current) use of anticoagulants: Secondary | ICD-10-CM | POA: Diagnosis not present

## 2023-06-23 DIAGNOSIS — I4819 Other persistent atrial fibrillation: Secondary | ICD-10-CM | POA: Diagnosis not present

## 2023-06-23 DIAGNOSIS — I10 Essential (primary) hypertension: Secondary | ICD-10-CM | POA: Diagnosis not present

## 2023-06-23 DIAGNOSIS — R413 Other amnesia: Secondary | ICD-10-CM | POA: Diagnosis not present

## 2023-06-23 DIAGNOSIS — Z13820 Encounter for screening for osteoporosis: Secondary | ICD-10-CM | POA: Diagnosis not present

## 2023-06-23 DIAGNOSIS — I700 Atherosclerosis of aorta: Secondary | ICD-10-CM | POA: Diagnosis not present

## 2023-06-23 DIAGNOSIS — E1165 Type 2 diabetes mellitus with hyperglycemia: Secondary | ICD-10-CM | POA: Diagnosis not present

## 2023-06-30 DIAGNOSIS — Z7901 Long term (current) use of anticoagulants: Secondary | ICD-10-CM | POA: Diagnosis not present

## 2023-06-30 DIAGNOSIS — Z87442 Personal history of urinary calculi: Secondary | ICD-10-CM | POA: Diagnosis not present

## 2023-06-30 DIAGNOSIS — Z0000 Encounter for general adult medical examination without abnormal findings: Secondary | ICD-10-CM | POA: Diagnosis not present

## 2023-06-30 DIAGNOSIS — I10 Essential (primary) hypertension: Secondary | ICD-10-CM | POA: Diagnosis not present

## 2023-06-30 DIAGNOSIS — E538 Deficiency of other specified B group vitamins: Secondary | ICD-10-CM | POA: Diagnosis not present

## 2023-06-30 DIAGNOSIS — D649 Anemia, unspecified: Secondary | ICD-10-CM | POA: Diagnosis not present

## 2023-06-30 DIAGNOSIS — I700 Atherosclerosis of aorta: Secondary | ICD-10-CM | POA: Diagnosis not present

## 2023-06-30 DIAGNOSIS — Z2821 Immunization not carried out because of patient refusal: Secondary | ICD-10-CM | POA: Diagnosis not present

## 2023-06-30 DIAGNOSIS — I420 Dilated cardiomyopathy: Secondary | ICD-10-CM | POA: Diagnosis not present

## 2023-06-30 DIAGNOSIS — E1165 Type 2 diabetes mellitus with hyperglycemia: Secondary | ICD-10-CM | POA: Diagnosis not present

## 2023-07-04 NOTE — Progress Notes (Unsigned)
Cardiology Office Note  Date:  07/05/2023   ID:  Vanessa Gentry, DOB October 22, 1932, MRN [id number]  PCP:  Barbette Reichmann, MD   Chief Complaint  Patient presents with   12 month follow up     "Doing well." Medications reviewed by the patient verbally.     HPI:  89 y.o. female with history of  hypertension,  diabetes,  hospital February 2021 , new onset atrial fibrillation with RVR, mildly reduced EF of 45 to 50%  Secondary to challenging rate control efforts, underwent TEE cardioversion Aortic atherosclerosis She presents today for follow-up of her atrial fibrillation  LOV 10/23 Feels well Active in house Memory "getting bad" Son visits on weekends  Drives, cook, cleans Does not do much outside  No arrhythmia or palpitations Gets " tired easy"  No recent hospitalizations Has not been checking blood pressure at home BP well controlled today Reports medication compliance  Lab work reviewed A1c 6.7 Total chol 191, LDL 118 Hemoglobin 11.4 down from 12.4  EKG personally reviewed by myself on todays visit EKG Interpretation Date/Time:  Tuesday July 05 2023 13:37:57 EDT Ventricular Rate:  55 PR Interval:  234 QRS Duration:  90 QT Interval:  428 QTC Calculation: 409 R Axis:   -15  Text Interpretation: Sinus bradycardia with 1st degree A-V block Nonspecific T wave abnormality When compared with ECG of 06-Feb-2020 07:05, rhythm now NSR Confirmed by Julien Nordmann ([id number]) on 07/05/2023 1:39:48 PM   Other past medical history reviewed  hospital February 2021 TEE performed October 12, 2019 followed by cardioversion, normal sinus rhythm   Estimated ejection fraction was 45%.  small  PFO descending thoracic aorta had moderate  mural aortic debris  Amiodarone started post cardioversion on Eliquis with metoprolol  01/15/2020 back in atrial fibrillation, had stopped amiodarone on her own  Started on Lasix   01/22/20 with improvement in orthopnea,  Underwent  successful cardioversion 02/06/2020.   PMH:   has a past medical history of A-fib (HCC), Arthritis, Diabetes mellitus without complication (HCC), Dysrhythmia, GERD (gastroesophageal reflux disease), Hypertension, and Pneumonia.  PSH:    Past Surgical History:  Procedure Laterality Date   ABDOMINAL HYSTERECTOMY     BREAST BIOPSY Left 2011   NEG   CARDIOVERSION N/A 10/12/2019   Procedure: CARDIOVERSION;  Surgeon: Antonieta Iba, MD;  Location: [facility];  Service: Cardiovascular;  Laterality: N/A;   CARDIOVERSION N/A 02/06/2020   Procedure: CARDIOVERSION;  Surgeon: Debbe Odea, MD;  Location: [facility];  Service: Cardiovascular;  Laterality: N/A;   CATARACT EXTRACTION W/PHACO Right 03/13/2015   Procedure: CATARACT EXTRACTION PHACO AND INTRAOCULAR LENS PLACEMENT (IOC);  Surgeon: Lockie Mola, MD;  Location: [facility];  Service: Ophthalmology;  Laterality: Right;  [place]  2:40  AP 20.3   CDE 32.41 cassette lot #   [id number]   CHOLECYSTECTOMY     ORIF ANKLE FRACTURE     TEE WITHOUT CARDIOVERSION N/A 10/12/2019   Procedure: TRANSESOPHAGEAL ECHOCARDIOGRAM (TEE);  Surgeon: Antonieta Iba, MD;  Location: [facility];  Service: Cardiovascular;  Laterality: N/A;   UPPER GI ENDOSCOPY      Current Outpatient Medications  Medication Sig Dispense Refill   acetaminophen (TYLENOL) 500 MG tablet Take 500 mg by mouth every 6 (six) hours as needed for moderate pain or headache.     amiodarone (PACERONE) 200 MG tablet TAKE 1 TABLET BY MOUTH EVERY DAY 90 tablet 3   amLODipine (NORVASC) 5 MG tablet TAKE 1 TABLET (5 MG TOTAL) BY MOUTH DAILY. 90  tablet 3   cetirizine (ZYRTEC) 10 MG tablet Take 10 mg by mouth daily as needed for allergies.     cyanocobalamin (VITAMIN B12) 1000 MCG tablet Take 1,000 mcg by mouth daily.     ELIQUIS 5 MG TABS tablet TAKE 1 TABLET BY MOUTH TWICE A DAY 60 tablet 5   hydrochlorothiazide (MICROZIDE) 12.5 MG capsule Take 1 capsule (12.5 mg total) by mouth daily. 90 capsule 3    metFORMIN (GLUCOPHAGE) 500 MG tablet TAKE 1 TABLET BY MOUTH TWICE A DAY WITH FOOD 180 tablet 1   metoprolol tartrate (LOPRESSOR) 50 MG tablet TAKE 1 TABLET BY MOUTH TWICE A DAY 180 tablet 3   olmesartan (BENICAR) 40 MG tablet TAKE 1 TABLET BY MOUTH EVERY DAY 90 tablet 0   No current facility-administered medications for this visit.    Allergies:   Metoprolol succinate [metoprolol], Sulfa antibiotics, and Iodinated contrast media   Social History:  The patient  reports that she has never smoked. She has never used smokeless tobacco. She reports that she does not drink alcohol and does not use drugs.   Family History:   family history includes Atrial fibrillation in her daughter; Cancer in her brother; Diabetes in her brother and daughter; Heart attack in her sister; Heart disease in her brother and sister; Heart failure in her father.    Review of Systems: Review of Systems  Constitutional: Negative.   HENT: Negative.    Respiratory: Negative.    Cardiovascular: Negative.   Gastrointestinal: Negative.   Musculoskeletal: Negative.   Neurological: Negative.   Psychiatric/Behavioral: Negative.    All other systems reviewed and are negative.   PHYSICAL EXAM: VS:  BP (!) 122/48 (BP Location: Left Arm, Patient Position: Sitting, Cuff Size: Normal)   Pulse (!) 55   Ht 5\' 4"  (1.626 m)   Wt 144 lb (65.3 kg)   SpO2 97%   BMI 24.72 kg/m  , BMI Body mass index is 24.72 kg/m. Constitutional:  oriented to person, place, and time. No distress.  HENT:  Head: Grossly normal Eyes:  no discharge. No scleral icterus.  Neck: No JVD, no carotid bruits  Cardiovascular: Regular rate and rhythm, no murmurs appreciated Pulmonary/Chest: Clear to auscultation bilaterally, no wheezes or rails Abdominal: Soft.  no distension.  no tenderness.  Musculoskeletal: Normal range of motion Neurological:  normal muscle tone. Coordination normal. No atrophy Skin: Skin warm and dry Psychiatric: normal affect,  pleasant  Recent Labs: No results found for requested labs within last 365 days.    Lipid Panel Lab Results  Component Value Date   CHOL 150 10/07/2019   HDL 41 10/07/2019   LDLCALC 96 10/07/2019   TRIG 64 10/07/2019      Wt Readings from Last 3 Encounters:  07/05/23 144 lb (65.3 kg)  07/05/22 151 lb 6 oz (68.7 kg)  11/11/21 150 lb 9.6 oz (68.3 kg)     ASSESSMENT AND PLAN:  Problem List Items Addressed This Visit       Cardiology Problems   Paroxysmal atrial fibrillation (HCC)   Relevant Orders   EKG 12-Lead (Completed)   Aortic atherosclerosis (HCC) - Primary   Relevant Orders   EKG 12-Lead (Completed)   Atrial fibrillation with RVR (HCC)   Relevant Orders   EKG 12-Lead (Completed)   Essential hypertension   Relevant Orders   EKG 12-Lead (Completed)     A. Fib/flutter RVR Recommend she continue current medications maintaining amiodarone, metoprolol 50 twice daily, anticoagulation with Eliquis Maintaining normal  sinus rhythm, denies tachypalpitations concerning for arrhythmia   Essential hypertension Recommend she continue olmesartan 40 daily metoprolol  HCTZ 12.5 mg daily amlodipine 5 Blood pressure well-controlled, denies orthostasis symptoms  Aortic atherosclerosis Statin previously held for  polypharmacy and medication confusion  Leg edema Trace edema, stable, possibly exacerbated by amlodipine Asymptomatic Continue HCTZ 12.5 daily     Signed, Dossie Arbour, M.D., Ph.D. [facility] [place], [place] [phone]

## 2023-07-05 ENCOUNTER — Ambulatory Visit: Payer: PPO | Attending: Cardiovascular Disease | Admitting: Cardiovascular Disease

## 2023-07-05 ENCOUNTER — Encounter: Payer: Self-pay | Admitting: Cardiovascular Disease

## 2023-07-05 VITALS — BP 122/48 | HR 55 | Ht 64.0 in | Wt 144.0 lb

## 2023-07-05 DIAGNOSIS — I4891 Unspecified atrial fibrillation: Secondary | ICD-10-CM | POA: Diagnosis not present

## 2023-07-05 DIAGNOSIS — I700 Atherosclerosis of aorta: Secondary | ICD-10-CM

## 2023-07-05 DIAGNOSIS — I480 Paroxysmal atrial fibrillation: Secondary | ICD-10-CM

## 2023-07-05 DIAGNOSIS — I10 Essential (primary) hypertension: Secondary | ICD-10-CM | POA: Diagnosis not present

## 2023-07-05 MED ORDER — AMLODIPINE BESYLATE 5 MG PO TABS: 5.0000 mg | ORAL_TABLET | Freq: Every day | ORAL | 3 refills | Status: DC

## 2023-07-05 MED ORDER — METOPROLOL TARTRATE 50 MG PO TABS: 50.0000 mg | ORAL_TABLET | Freq: Two times a day (BID) | ORAL | 3 refills | Status: DC

## 2023-07-05 MED ORDER — OLMESARTAN MEDOXOMIL 40 MG PO TABS: 40.0000 mg | ORAL_TABLET | Freq: Every day | ORAL | 3 refills | Status: DC

## 2023-07-05 MED ORDER — AMIODARONE HCL 200 MG PO TABS: 200.0000 mg | ORAL_TABLET | Freq: Every day | ORAL | 3 refills | Status: DC

## 2023-07-05 MED ORDER — HYDROCHLOROTHIAZIDE 12.5 MG PO CAPS: 12.5000 mg | ORAL_CAPSULE | Freq: Every day | ORAL | 3 refills | Status: AC

## 2023-07-05 NOTE — Patient Instructions (Signed)

## 2023-07-13 ENCOUNTER — Other Ambulatory Visit: Payer: Self-pay | Admitting: Cardiovascular Disease

## 2023-07-13 NOTE — Telephone Encounter (Signed)
Prescription refill request for Eliquis received. Indication: AF Last office visit: 07/05/23  Concha Se MD Scr: 1.0 on 06/23/23  Epic Age: 89 Weight: 65.3kg  Based on above findings Eliquis 5mg  twice daily is the appropriate dose.  Refill approved.

## 2023-12-22 DIAGNOSIS — Z7901 Long term (current) use of anticoagulants: Secondary | ICD-10-CM | POA: Diagnosis not present

## 2023-12-22 DIAGNOSIS — Z87442 Personal history of urinary calculi: Secondary | ICD-10-CM | POA: Diagnosis not present

## 2023-12-22 DIAGNOSIS — E1165 Type 2 diabetes mellitus with hyperglycemia: Secondary | ICD-10-CM | POA: Diagnosis not present

## 2023-12-22 DIAGNOSIS — E538 Deficiency of other specified B group vitamins: Secondary | ICD-10-CM | POA: Diagnosis not present

## 2023-12-22 DIAGNOSIS — I420 Dilated cardiomyopathy: Secondary | ICD-10-CM | POA: Diagnosis not present

## 2023-12-22 DIAGNOSIS — I700 Atherosclerosis of aorta: Secondary | ICD-10-CM | POA: Diagnosis not present

## 2023-12-22 DIAGNOSIS — I10 Essential (primary) hypertension: Secondary | ICD-10-CM | POA: Diagnosis not present

## 2023-12-22 DIAGNOSIS — D649 Anemia, unspecified: Secondary | ICD-10-CM | POA: Diagnosis not present

## 2023-12-22 DIAGNOSIS — Z0000 Encounter for general adult medical examination without abnormal findings: Secondary | ICD-10-CM | POA: Diagnosis not present

## 2023-12-29 DIAGNOSIS — I4819 Other persistent atrial fibrillation: Secondary | ICD-10-CM | POA: Diagnosis not present

## 2023-12-29 DIAGNOSIS — E1165 Type 2 diabetes mellitus with hyperglycemia: Secondary | ICD-10-CM | POA: Diagnosis not present

## 2023-12-29 DIAGNOSIS — I5020 Unspecified systolic (congestive) heart failure: Secondary | ICD-10-CM | POA: Diagnosis not present

## 2023-12-29 DIAGNOSIS — R790 Abnormal level of blood mineral: Secondary | ICD-10-CM | POA: Diagnosis not present

## 2023-12-29 DIAGNOSIS — Z0000 Encounter for general adult medical examination without abnormal findings: Secondary | ICD-10-CM | POA: Diagnosis not present

## 2023-12-29 DIAGNOSIS — I10 Essential (primary) hypertension: Secondary | ICD-10-CM | POA: Diagnosis not present

## 2023-12-29 DIAGNOSIS — S39012A Strain of muscle, fascia and tendon of lower back, initial encounter: Secondary | ICD-10-CM | POA: Diagnosis not present

## 2023-12-29 DIAGNOSIS — Z7901 Long term (current) use of anticoagulants: Secondary | ICD-10-CM | POA: Diagnosis not present

## 2023-12-29 DIAGNOSIS — D649 Anemia, unspecified: Secondary | ICD-10-CM | POA: Diagnosis not present

## 2023-12-29 DIAGNOSIS — Z87442 Personal history of urinary calculi: Secondary | ICD-10-CM | POA: Diagnosis not present

## 2024-01-22 ENCOUNTER — Other Ambulatory Visit: Payer: Self-pay | Admitting: Cardiovascular Disease

## 2024-01-22 DIAGNOSIS — I4891 Unspecified atrial fibrillation: Secondary | ICD-10-CM

## 2024-06-21 ENCOUNTER — Other Ambulatory Visit: Payer: Self-pay | Admitting: Cardiovascular Disease

## 2024-06-21 DIAGNOSIS — I10 Essential (primary) hypertension: Secondary | ICD-10-CM

## 2024-06-21 NOTE — Telephone Encounter (Signed)
 Please contact pt for future appointment. Pt due for 12 month f/u.

## 2024-06-21 NOTE — Telephone Encounter (Signed)
 Left voice mail

## 2024-06-25 NOTE — Telephone Encounter (Signed)
 Left voice mail

## 2024-06-27 DIAGNOSIS — D649 Anemia, unspecified: Secondary | ICD-10-CM | POA: Diagnosis not present

## 2024-06-27 DIAGNOSIS — Z7901 Long term (current) use of anticoagulants: Secondary | ICD-10-CM | POA: Diagnosis not present

## 2024-06-27 DIAGNOSIS — I4819 Other persistent atrial fibrillation: Secondary | ICD-10-CM | POA: Diagnosis not present

## 2024-06-27 DIAGNOSIS — E1165 Type 2 diabetes mellitus with hyperglycemia: Secondary | ICD-10-CM | POA: Diagnosis not present

## 2024-06-27 DIAGNOSIS — S39012A Strain of muscle, fascia and tendon of lower back, initial encounter: Secondary | ICD-10-CM | POA: Diagnosis not present

## 2024-06-27 DIAGNOSIS — Z87442 Personal history of urinary calculi: Secondary | ICD-10-CM | POA: Diagnosis not present

## 2024-06-27 DIAGNOSIS — Z79899 Other long term (current) drug therapy: Secondary | ICD-10-CM | POA: Diagnosis not present

## 2024-06-27 DIAGNOSIS — I10 Essential (primary) hypertension: Secondary | ICD-10-CM | POA: Diagnosis not present

## 2024-06-27 DIAGNOSIS — R790 Abnormal level of blood mineral: Secondary | ICD-10-CM | POA: Diagnosis not present

## 2024-07-02 NOTE — Telephone Encounter (Signed)
 Left voice mail

## 2024-07-04 DIAGNOSIS — R7989 Other specified abnormal findings of blood chemistry: Secondary | ICD-10-CM | POA: Diagnosis not present

## 2024-07-04 DIAGNOSIS — Z7901 Long term (current) use of anticoagulants: Secondary | ICD-10-CM | POA: Diagnosis not present

## 2024-07-04 DIAGNOSIS — Z6827 Body mass index (BMI) 27.0-27.9, adult: Secondary | ICD-10-CM | POA: Diagnosis not present

## 2024-07-04 DIAGNOSIS — I700 Atherosclerosis of aorta: Secondary | ICD-10-CM | POA: Diagnosis not present

## 2024-07-04 DIAGNOSIS — E119 Type 2 diabetes mellitus without complications: Secondary | ICD-10-CM | POA: Diagnosis not present

## 2024-07-04 DIAGNOSIS — Z0000 Encounter for general adult medical examination without abnormal findings: Secondary | ICD-10-CM | POA: Diagnosis not present

## 2024-07-04 DIAGNOSIS — I4819 Other persistent atrial fibrillation: Secondary | ICD-10-CM | POA: Diagnosis not present

## 2024-07-04 DIAGNOSIS — I10 Essential (primary) hypertension: Secondary | ICD-10-CM | POA: Diagnosis not present

## 2024-07-04 DIAGNOSIS — Z1331 Encounter for screening for depression: Secondary | ICD-10-CM | POA: Diagnosis not present

## 2024-07-04 DIAGNOSIS — Z87442 Personal history of urinary calculi: Secondary | ICD-10-CM | POA: Diagnosis not present

## 2024-07-22 ENCOUNTER — Other Ambulatory Visit: Payer: Self-pay | Admitting: Cardiovascular Disease

## 2024-07-23 ENCOUNTER — Other Ambulatory Visit: Payer: Self-pay | Admitting: Cardiovascular Disease

## 2024-07-23 DIAGNOSIS — I10 Essential (primary) hypertension: Secondary | ICD-10-CM

## 2024-07-26 ENCOUNTER — Other Ambulatory Visit: Payer: Self-pay | Admitting: Cardiovascular Disease

## 2024-07-26 DIAGNOSIS — I10 Essential (primary) hypertension: Secondary | ICD-10-CM

## 2024-07-27 NOTE — Telephone Encounter (Signed)
 Please contact pt for future appointment. Pt due for follow up.

## 2024-07-30 NOTE — Telephone Encounter (Signed)
Left message on VM to schedule.

## 2024-07-31 ENCOUNTER — Other Ambulatory Visit: Payer: Self-pay | Admitting: Cardiovascular Disease

## 2024-08-06 ENCOUNTER — Telehealth: Payer: Self-pay | Admitting: Cardiovascular Disease

## 2024-08-06 MED ORDER — AMIODARONE HCL 200 MG PO TABS: 200.0000 mg | ORAL_TABLET | Freq: Every day | ORAL | 0 refills | Status: DC

## 2024-08-06 NOTE — Telephone Encounter (Signed)
*  STAT* If patient is at the pharmacy, call can be transferred to refill team.   1. Which medications need to be refilled? (please list name of each medication and dose if known) Amiodarone  200 mg tablet 1x daily   2. Would you like to learn more about the convenience, safety, & potential cost savings by using the [facility]? no     3. Are you open to using the [facility] (Type [facility].  ).   4. Which pharmacy/location (including street and city if local pharmacy) is medication to be sent to?  [facility] [address] [place]   5. Do they need a 30 day or 90 day supply? 90 day

## 2024-08-06 NOTE — Telephone Encounter (Signed)
Sent in Refill

## 2024-08-08 ENCOUNTER — Other Ambulatory Visit: Payer: Self-pay | Admitting: Cardiovascular Disease

## 2024-08-23 NOTE — Telephone Encounter (Signed)
 Patient scheduled on 09/25/24

## 2024-09-24 ENCOUNTER — Other Ambulatory Visit: Payer: Self-pay | Admitting: Cardiovascular Disease

## 2024-09-24 NOTE — Progress Notes (Unsigned)
 Cardiology Office Note  Date:  09/25/2024   ID:  Vanessa Gentry, Vanessa Gentry [birth year], MRN [id number]  PCP:  Sadie Manna, MD   Chief Complaint  Patient presents with   12 month follow up     Patient c/o chest pain & shortness of breath at times.     HPI:  89 y.o. female with history of  hypertension,  diabetes,  hospital February 2021 , new onset atrial fibrillation with RVR, mildly reduced EF of 45 to 50%  Secondary to challenging rate control efforts, underwent TEE cardioversion Aortic atherosclerosis She presents today for follow-up of her atrial fibrillation  LOV 10/24 In follow-up today she reports she continues to live alone Son visits on weekends but he is very busy, he builds race cars And Spends most of her time in the house, does not go outside much No falls Does not like taking medications, uses a pillbox Does not want cholesterol medication or any additional pills Prior history medication confusion  Memory getting bad  Continues to drive, cooks, cleans  No arrhythmia or palpitations  No recent hospitalizations Has not been checking blood pressure at home Initial blood pressure elevated, mild improvement on recheck  Lab work reviewed A1c 6.9 Total chol 222, LDL 127 Hemoglobin 12.6  EKG personally reviewed by myself on todays visit EKG Interpretation Date/Time:  Tuesday September 25 2024 09:38:07 EST Ventricular Rate:  59 PR Interval:  216 QRS Duration:  90 QT Interval:  454 QTC Calculation: 449 R Axis:   -8  Text Interpretation: Sinus bradycardia with 1st degree A-V block When compared with ECG of 05-Jul-2023 13:37, No significant change was found Confirmed by Perla Lye [id number]) on 09/25/2024 9:56:23 AM   Other past medical history reviewed  hospital February 2021 TEE performed October 12, 2019 followed by cardioversion, normal sinus rhythm   Estimated ejection fraction was 45%.  small  PFO descending thoracic aorta had moderate  mural  aortic debris  Amiodarone  started post cardioversion on Eliquis  with metoprolol   01/15/2020 back in atrial fibrillation, had stopped amiodarone  on her own  Started on Lasix    01/22/20 with improvement in orthopnea,  Underwent successful cardioversion 02/06/2020.   PMH:   has a past medical history of A-fib (HCC), Arthritis, Diabetes mellitus without complication (HCC), Dysrhythmia, GERD (gastroesophageal reflux disease), Hypertension, and Pneumonia.  PSH:    Past Surgical History:  Procedure Laterality Date   ABDOMINAL HYSTERECTOMY     BREAST BIOPSY Left 2011   NEG   CARDIOVERSION N/A 10/12/2019   Procedure: CARDIOVERSION;  Surgeon: Perla Lye PARAS, MD;  Location: [facility];  Service: Cardiovascular;  Laterality: N/A;   CARDIOVERSION N/A 02/06/2020   Procedure: CARDIOVERSION;  Surgeon: Darliss Rogue, MD;  Location: [facility];  Service: Cardiovascular;  Laterality: N/A;   CATARACT EXTRACTION W/PHACO Right 03/13/2015   Procedure: CATARACT EXTRACTION PHACO AND INTRAOCULAR LENS PLACEMENT (IOC);  Surgeon: Dene Etienne, MD;  Location: [facility];  Service: Ophthalmology;  Laterality: Right;  US   2:40  AP 20.3   CDE 32.41 cassette lot #   [id number]   CHOLECYSTECTOMY     ORIF ANKLE FRACTURE     TEE WITHOUT CARDIOVERSION N/A 10/12/2019   Procedure: TRANSESOPHAGEAL ECHOCARDIOGRAM (TEE);  Surgeon: Perla Lye PARAS, MD;  Location: [facility];  Service: Cardiovascular;  Laterality: N/A;   UPPER GI ENDOSCOPY      Current Outpatient Medications  Medication Sig Dispense Refill   acetaminophen  (TYLENOL ) 500 MG tablet Take 500 mg by mouth every 6 (six) hours  as needed for moderate pain or headache.     amiodarone  (PACERONE ) 200 MG tablet Take 1 tablet (200 mg total) by mouth daily. Please keep your appointment with Dr. Dreanna Kyllo in January for further refills. Thank you! 90 tablet 0   amLODipine  (NORVASC ) 5 MG tablet TAKE 1 TABLET (5 MG TOTAL) BY MOUTH DAILY. 15 tablet 0   cetirizine (ZYRTEC) 10 MG  tablet Take 10 mg by mouth daily as needed for allergies.     ELIQUIS  5 MG TABS tablet TAKE 1 TABLET BY MOUTH TWICE A DAY 60 tablet 5   hydrochlorothiazide  (MICROZIDE ) 12.5 MG capsule Take 1 capsule (12.5 mg total) by mouth daily. 90 capsule 3   metFORMIN  (GLUCOPHAGE ) 500 MG tablet TAKE 1 TABLET BY MOUTH TWICE A DAY WITH FOOD 180 tablet 1   metoprolol  tartrate (LOPRESSOR ) 50 MG tablet TAKE 1 TABLET BY MOUTH TWICE A DAY 180 tablet 0   olmesartan  (BENICAR ) 40 MG tablet Take 1 tablet (40 mg total) by mouth daily. 90 tablet 3   No current facility-administered medications for this visit.    Allergies:   Metoprolol  succinate [metoprolol ], Sulfa antibiotics, and Iodinated contrast media   Social History:  The patient  reports that she has never smoked. She has never used smokeless tobacco. She reports that she does not drink alcohol and does not use drugs.   Family History:   family history includes Atrial fibrillation in her daughter; Cancer in her brother; Diabetes in her brother and daughter; Heart attack in her sister; Heart disease in her brother and sister; Heart failure in her father.    Review of Systems: Review of Systems  Constitutional: Negative.   HENT: Negative.    Respiratory: Negative.    Cardiovascular: Negative.   Gastrointestinal: Negative.   Musculoskeletal: Negative.   Neurological: Negative.   Psychiatric/Behavioral: Negative.    All other systems reviewed and are negative.   PHYSICAL EXAM: VS:  BP 130/80 (BP Location: Left Arm, Patient Position: Sitting, Cuff Size: Normal)   Pulse (!) 59   Ht 5' 4 (1.626 m)   Wt 160 lb 6 oz (72.7 kg)   SpO2 97%   BMI 27.53 kg/m  , BMI Body mass index is 27.53 kg/m. Constitutional:  oriented to person, place, and time. No distress.  HENT:  Head: Normocephalic and atraumatic.  Eyes:  no discharge. No scleral icterus.  Neck: Normal range of motion. Neck supple. No JVD present.  Cardiovascular: Normal rate, regular rhythm,  normal heart sounds and intact distal pulses. Exam reveals no gallop and no friction rub. No edema No murmur heard. Pulmonary/Chest: Effort normal and breath sounds normal. No stridor. No respiratory distress.  no wheezes.  no rales.  no tenderness.  Abdominal: Soft.  no distension.  no tenderness.  Musculoskeletal: Normal range of motion.  no  tenderness or deformity.  Neurological:  normal muscle tone. Coordination normal. No atrophy Skin: Skin is warm and dry. No rash noted. not diaphoretic.  Psychiatric:  normal mood and affect. behavior is normal. Thought content normal.   Recent Labs: No results found for requested labs within last 365 days.    Lipid Panel Lab Results  Component Value Date   CHOL 150 10/07/2019   HDL 41 10/07/2019   LDLCALC 96 10/07/2019   TRIG 64 10/07/2019    Wt Readings from Last 3 Encounters:  09/25/24 160 lb 6 oz (72.7 kg)  07/05/23 144 lb (65.3 kg)  07/05/22 151 lb 6 oz (68.7 kg)  ASSESSMENT AND PLAN:  Problem List Items Addressed This Visit       Cardiology Problems   Paroxysmal atrial fibrillation (HCC) - Primary   Relevant Orders   EKG 12-Lead (Completed)   HFrEF (heart failure with reduced ejection fraction) (HCC)   Relevant Orders   EKG 12-Lead (Completed)   Aortic atherosclerosis   Relevant Orders   EKG 12-Lead (Completed)   Cardiomyopathy (HCC)   Essential hypertension   Relevant Orders   EKG 12-Lead (Completed)     Other   Type 2 diabetes mellitus without complication, without long-term current use of insulin  (HCC)   Chronic anticoagulation   Dyspnea    A. Fib/flutter RVR Maintaining normal sinus rhythm Recommend she continue amiodarone  200 daily metoprolol  50 twice daily Eliquis  5 twice daily -She does not want to change Eliquis  to Xarelto in an effort to decrease frequency of medications   Essential hypertension Initial blood pressure elevated, repeat check improved No changes made to the medications.  Aortic  atherosclerosis Statin previously held for  polypharmacy and medication confusion, discussed again, she prefers no cholesterol medication and will work on her diet  Leg edema Trace edema, stable, possibly exacerbated by amlodipine  Asymptomatic Continue HCTZ 12.5 daily     Signed, Velinda Lunger, M.D., Ph.D. [facility] [place], [place] [phone]

## 2024-09-25 ENCOUNTER — Encounter: Payer: Self-pay | Admitting: Cardiovascular Disease

## 2024-09-25 ENCOUNTER — Ambulatory Visit: Attending: Cardiovascular Disease | Admitting: Cardiovascular Disease

## 2024-09-25 VITALS — BP 130/80 | HR 59 | Ht 64.0 in | Wt 160.4 lb

## 2024-09-25 DIAGNOSIS — R0603 Acute respiratory distress: Secondary | ICD-10-CM | POA: Diagnosis not present

## 2024-09-25 DIAGNOSIS — I420 Dilated cardiomyopathy: Secondary | ICD-10-CM

## 2024-09-25 DIAGNOSIS — I10 Essential (primary) hypertension: Secondary | ICD-10-CM | POA: Diagnosis not present

## 2024-09-25 DIAGNOSIS — Z7901 Long term (current) use of anticoagulants: Secondary | ICD-10-CM | POA: Diagnosis not present

## 2024-09-25 DIAGNOSIS — E119 Type 2 diabetes mellitus without complications: Secondary | ICD-10-CM | POA: Diagnosis not present

## 2024-09-25 DIAGNOSIS — I700 Atherosclerosis of aorta: Secondary | ICD-10-CM | POA: Diagnosis not present

## 2024-09-25 DIAGNOSIS — I480 Paroxysmal atrial fibrillation: Secondary | ICD-10-CM

## 2024-09-25 DIAGNOSIS — I5020 Unspecified systolic (congestive) heart failure: Secondary | ICD-10-CM

## 2024-09-25 MED ORDER — AMLODIPINE BESYLATE 5 MG PO TABS: 5.0000 mg | ORAL_TABLET | Freq: Every day | ORAL | 5 refills | Status: AC

## 2024-09-25 MED ORDER — OLMESARTAN MEDOXOMIL 40 MG PO TABS: 40.0000 mg | ORAL_TABLET | Freq: Every day | ORAL | 5 refills | Status: AC

## 2024-09-25 MED ORDER — AMIODARONE HCL 200 MG PO TABS: 200.0000 mg | ORAL_TABLET | Freq: Every day | ORAL | 5 refills | Status: AC

## 2024-09-25 NOTE — Patient Instructions (Signed)
